# Patient Record
Sex: Female | Born: 1960 | Race: White | Hispanic: No | Marital: Married | Smoking: Never smoker
Health system: Southern US, Community
[De-identification: ages and names within clinical notes are randomized; demographics above are authoritative.]

## PROBLEM LIST (undated history)

## (undated) DIAGNOSIS — T7840XA Allergy, unspecified, initial encounter: Secondary | ICD-10-CM

## (undated) DIAGNOSIS — K219 Gastro-esophageal reflux disease without esophagitis: Secondary | ICD-10-CM

## (undated) DIAGNOSIS — I10 Essential (primary) hypertension: Secondary | ICD-10-CM

## (undated) DIAGNOSIS — E039 Hypothyroidism, unspecified: Secondary | ICD-10-CM

## (undated) DIAGNOSIS — R011 Cardiac murmur, unspecified: Secondary | ICD-10-CM

## (undated) DIAGNOSIS — E785 Hyperlipidemia, unspecified: Secondary | ICD-10-CM

## (undated) DIAGNOSIS — R1013 Epigastric pain: Secondary | ICD-10-CM

## (undated) HISTORY — PX: ABLATION: SHX5711

## (undated) HISTORY — DX: Epigastric pain: R10.13

## (undated) HISTORY — DX: Allergy, unspecified, initial encounter: T78.40XA

## (undated) HISTORY — DX: Essential (primary) hypertension: I10

## (undated) HISTORY — DX: Gastro-esophageal reflux disease without esophagitis: K21.9

## (undated) HISTORY — DX: Hyperlipidemia, unspecified: E78.5

---

## 1997-12-15 ENCOUNTER — Other Ambulatory Visit: Admission: RE | Admit: 1997-12-15 | Discharge: 1997-12-15 | Payer: Self-pay | Admitting: Obstetrics & Gynecology

## 1999-02-17 ENCOUNTER — Other Ambulatory Visit: Admission: RE | Admit: 1999-02-17 | Discharge: 1999-02-17 | Payer: Self-pay | Admitting: Obstetrics and Gynecology

## 2001-06-06 ENCOUNTER — Other Ambulatory Visit: Admission: RE | Admit: 2001-06-06 | Discharge: 2001-06-06 | Payer: Self-pay | Admitting: Obstetrics and Gynecology

## 2001-07-05 ENCOUNTER — Encounter: Payer: Self-pay | Admitting: Obstetrics and Gynecology

## 2001-07-05 ENCOUNTER — Encounter: Admission: RE | Admit: 2001-07-05 | Discharge: 2001-07-05 | Payer: Self-pay | Admitting: Obstetrics and Gynecology

## 2002-06-11 ENCOUNTER — Other Ambulatory Visit: Admission: RE | Admit: 2002-06-11 | Discharge: 2002-06-11 | Payer: Self-pay | Admitting: Obstetrics and Gynecology

## 2002-10-31 ENCOUNTER — Encounter: Admission: RE | Admit: 2002-10-31 | Discharge: 2002-10-31 | Payer: Self-pay | Admitting: Obstetrics and Gynecology

## 2002-10-31 ENCOUNTER — Encounter: Payer: Self-pay | Admitting: Obstetrics and Gynecology

## 2003-06-15 ENCOUNTER — Other Ambulatory Visit: Admission: RE | Admit: 2003-06-15 | Discharge: 2003-06-15 | Payer: Self-pay | Admitting: Obstetrics and Gynecology

## 2003-11-23 ENCOUNTER — Encounter: Admission: RE | Admit: 2003-11-23 | Discharge: 2003-11-23 | Payer: Self-pay | Admitting: Obstetrics and Gynecology

## 2004-06-16 ENCOUNTER — Other Ambulatory Visit: Admission: RE | Admit: 2004-06-16 | Discharge: 2004-06-16 | Payer: Self-pay | Admitting: Obstetrics and Gynecology

## 2005-01-04 ENCOUNTER — Encounter: Admission: RE | Admit: 2005-01-04 | Discharge: 2005-01-04 | Payer: Self-pay | Admitting: Obstetrics and Gynecology

## 2005-06-19 ENCOUNTER — Other Ambulatory Visit: Admission: RE | Admit: 2005-06-19 | Discharge: 2005-06-19 | Payer: Self-pay | Admitting: Obstetrics and Gynecology

## 2006-01-05 ENCOUNTER — Encounter: Admission: RE | Admit: 2006-01-05 | Discharge: 2006-01-05 | Payer: Self-pay | Admitting: Obstetrics and Gynecology

## 2007-01-09 ENCOUNTER — Encounter: Admission: RE | Admit: 2007-01-09 | Discharge: 2007-01-09 | Payer: Self-pay | Admitting: Obstetrics and Gynecology

## 2007-04-04 ENCOUNTER — Ambulatory Visit: Payer: Self-pay | Admitting: Internal Medicine

## 2007-04-04 DIAGNOSIS — K3189 Other diseases of stomach and duodenum: Secondary | ICD-10-CM | POA: Insufficient documentation

## 2007-04-04 DIAGNOSIS — R1013 Epigastric pain: Secondary | ICD-10-CM

## 2007-04-09 ENCOUNTER — Encounter: Payer: Self-pay | Admitting: Internal Medicine

## 2007-04-13 ENCOUNTER — Encounter (INDEPENDENT_AMBULATORY_CARE_PROVIDER_SITE_OTHER): Payer: Self-pay | Admitting: *Deleted

## 2007-04-29 ENCOUNTER — Ambulatory Visit: Payer: Self-pay | Admitting: Internal Medicine

## 2007-04-29 ENCOUNTER — Encounter (INDEPENDENT_AMBULATORY_CARE_PROVIDER_SITE_OTHER): Payer: Self-pay | Admitting: *Deleted

## 2007-05-02 HISTORY — PX: COLONOSCOPY: SHX174

## 2007-05-08 ENCOUNTER — Ambulatory Visit: Payer: Self-pay | Admitting: Internal Medicine

## 2007-05-10 ENCOUNTER — Ambulatory Visit: Payer: Self-pay | Admitting: Gastroenterology

## 2007-06-05 ENCOUNTER — Ambulatory Visit: Payer: Self-pay | Admitting: Gastroenterology

## 2007-06-05 ENCOUNTER — Encounter: Payer: Self-pay | Admitting: Internal Medicine

## 2007-06-05 LAB — HM COLONOSCOPY

## 2007-07-25 ENCOUNTER — Ambulatory Visit: Payer: Self-pay | Admitting: Internal Medicine

## 2007-07-29 LAB — CONVERTED CEMR LAB
ALT: 19 units/L (ref 0–35)
Bilirubin, Direct: 0.1 mg/dL (ref 0.0–0.3)
Cholesterol: 196 mg/dL (ref 0–200)
HDL: 46.1 mg/dL (ref >39.0)
Total Protein: 6.8 g/dL (ref 6.0–8.3)
VLDL: 24 mg/dL (ref 0–40)

## 2007-07-30 ENCOUNTER — Encounter (INDEPENDENT_AMBULATORY_CARE_PROVIDER_SITE_OTHER): Payer: Self-pay | Admitting: *Deleted

## 2007-08-01 ENCOUNTER — Ambulatory Visit: Payer: Self-pay | Admitting: Internal Medicine

## 2007-08-01 DIAGNOSIS — E785 Hyperlipidemia, unspecified: Secondary | ICD-10-CM

## 2007-08-01 DIAGNOSIS — E7800 Pure hypercholesterolemia, unspecified: Secondary | ICD-10-CM | POA: Insufficient documentation

## 2007-10-30 ENCOUNTER — Encounter: Payer: Self-pay | Admitting: Internal Medicine

## 2007-11-05 ENCOUNTER — Ambulatory Visit: Payer: Self-pay | Admitting: Internal Medicine

## 2007-11-16 LAB — CONVERTED CEMR LAB
AST: 16 units/L (ref 0–37)
Albumin: 3.4 g/dL — ABNORMAL LOW (ref 3.5–5.2)
Alkaline Phosphatase: 43 units/L (ref 39–117)
Total Bilirubin: 0.6 mg/dL (ref 0.3–1.2)
Total CHOL/HDL Ratio: 3.3
Triglycerides: 111 mg/dL (ref 0–149)

## 2007-11-19 ENCOUNTER — Encounter (INDEPENDENT_AMBULATORY_CARE_PROVIDER_SITE_OTHER): Payer: Self-pay | Admitting: *Deleted

## 2007-11-22 ENCOUNTER — Telehealth (INDEPENDENT_AMBULATORY_CARE_PROVIDER_SITE_OTHER): Payer: Self-pay | Admitting: *Deleted

## 2008-01-27 ENCOUNTER — Ambulatory Visit (HOSPITAL_BASED_OUTPATIENT_CLINIC_OR_DEPARTMENT_OTHER): Admission: RE | Admit: 2008-01-27 | Discharge: 2008-01-27 | Payer: Self-pay | Admitting: Obstetrics and Gynecology

## 2008-05-07 ENCOUNTER — Ambulatory Visit: Payer: Self-pay | Admitting: Internal Medicine

## 2008-05-16 LAB — CONVERTED CEMR LAB
Bilirubin, Direct: 0.1 mg/dL (ref 0.0–0.3)
LDL Cholesterol: 123 mg/dL — ABNORMAL HIGH (ref 0–99)
Total Bilirubin: 0.7 mg/dL (ref 0.3–1.2)
Total CHOL/HDL Ratio: 4.3
VLDL: 24 mg/dL (ref 0–40)

## 2008-05-18 ENCOUNTER — Telehealth (INDEPENDENT_AMBULATORY_CARE_PROVIDER_SITE_OTHER): Payer: Self-pay | Admitting: *Deleted

## 2008-05-19 ENCOUNTER — Ambulatory Visit: Payer: Self-pay | Admitting: Internal Medicine

## 2008-05-19 LAB — CONVERTED CEMR LAB
HDL goal, serum: 50 mg/dL
LDL Goal: 100 mg/dL

## 2008-08-11 ENCOUNTER — Telehealth (INDEPENDENT_AMBULATORY_CARE_PROVIDER_SITE_OTHER): Payer: Self-pay | Admitting: *Deleted

## 2008-08-21 ENCOUNTER — Ambulatory Visit: Payer: Self-pay | Admitting: Internal Medicine

## 2008-08-23 LAB — CONVERTED CEMR LAB
ALT: 18 units/L (ref 0–35)
AST: 19 units/L (ref 0–37)
Albumin: 3.7 g/dL (ref 3.5–5.2)
Cholesterol: 200 mg/dL (ref 0–200)
HDL: 45.7 mg/dL (ref >39.00)
Total CK: 65 units/L (ref 7–177)
Triglycerides: 150 mg/dL — ABNORMAL HIGH (ref 0.0–149.0)

## 2008-08-24 ENCOUNTER — Encounter (INDEPENDENT_AMBULATORY_CARE_PROVIDER_SITE_OTHER): Payer: Self-pay | Admitting: *Deleted

## 2009-01-27 ENCOUNTER — Ambulatory Visit (HOSPITAL_BASED_OUTPATIENT_CLINIC_OR_DEPARTMENT_OTHER): Admission: RE | Admit: 2009-01-27 | Discharge: 2009-01-27 | Payer: Self-pay | Admitting: Nurse Practitioner

## 2009-01-27 ENCOUNTER — Ambulatory Visit: Payer: Self-pay | Admitting: Diagnostic Radiology

## 2009-06-11 ENCOUNTER — Telehealth (INDEPENDENT_AMBULATORY_CARE_PROVIDER_SITE_OTHER): Payer: Self-pay | Admitting: *Deleted

## 2009-07-19 ENCOUNTER — Ambulatory Visit: Payer: Self-pay | Admitting: Family Medicine

## 2009-07-19 ENCOUNTER — Encounter: Payer: Self-pay | Admitting: Internal Medicine

## 2009-07-19 DIAGNOSIS — I10 Essential (primary) hypertension: Secondary | ICD-10-CM | POA: Insufficient documentation

## 2009-07-19 DIAGNOSIS — R609 Edema, unspecified: Secondary | ICD-10-CM | POA: Insufficient documentation

## 2009-07-26 LAB — CONVERTED CEMR LAB
BUN: 14 mg/dL (ref 6–23)
Bilirubin, Direct: 0.1 mg/dL (ref 0.0–0.3)
Calcium: 9.4 mg/dL (ref 8.4–10.5)
Creatinine, Ser: 0.7 mg/dL (ref 0.4–1.2)
TSH: 3.27 microintl units/mL (ref 0.35–5.50)
Total Bilirubin: 0.4 mg/dL (ref 0.3–1.2)
Total Protein: 6.4 g/dL (ref 6.0–8.3)

## 2009-08-02 ENCOUNTER — Ambulatory Visit: Payer: Self-pay | Admitting: Family Medicine

## 2009-09-06 ENCOUNTER — Telehealth (INDEPENDENT_AMBULATORY_CARE_PROVIDER_SITE_OTHER): Payer: Self-pay | Admitting: *Deleted

## 2009-09-17 ENCOUNTER — Ambulatory Visit: Payer: Self-pay | Admitting: Internal Medicine

## 2009-09-17 LAB — CONVERTED CEMR LAB
Bilirubin Urine: NEGATIVE
Blood in Urine, dipstick: NEGATIVE
Glucose, Urine, Semiquant: NEGATIVE
Protein, U semiquant: NEGATIVE
Specific Gravity, Urine: 1.01
pH: 6.5

## 2009-09-20 LAB — CONVERTED CEMR LAB
ALT: 26 units/L (ref 0–35)
AST: 22 units/L (ref 0–37)
BUN: 17 mg/dL (ref 6–23)
Basophils Relative: 0.8 % (ref 0.0–3.0)
Bilirubin, Direct: 0.1 mg/dL (ref 0.0–0.3)
Calcium: 9.3 mg/dL (ref 8.4–10.5)
Cholesterol: 178 mg/dL (ref 0–200)
Creatinine, Ser: 0.7 mg/dL (ref 0.4–1.2)
Eosinophils Relative: 7.3 % — ABNORMAL HIGH (ref 0.0–5.0)
GFR calc non Af Amer: 94.76 mL/min (ref >60)
Glucose, Bld: 89 mg/dL (ref 70–99)
Lymphs Abs: 1.9 10*3/uL (ref 0.7–4.0)
Monocytes Absolute: 0.4 10*3/uL (ref 0.1–1.0)
Neutro Abs: 2.4 10*3/uL (ref 1.4–7.7)
Potassium: 4.1 meq/L (ref 3.5–5.1)
TSH: 4.15 microintl units/mL (ref 0.35–5.50)
Total Protein: 6.9 g/dL (ref 6.0–8.3)
VLDL: 28.6 mg/dL (ref 0.0–40.0)

## 2009-09-24 ENCOUNTER — Encounter: Payer: Self-pay | Admitting: Internal Medicine

## 2009-09-24 ENCOUNTER — Ambulatory Visit: Payer: Self-pay | Admitting: Internal Medicine

## 2009-09-24 ENCOUNTER — Encounter (INDEPENDENT_AMBULATORY_CARE_PROVIDER_SITE_OTHER): Payer: Self-pay | Admitting: *Deleted

## 2009-09-24 LAB — CONVERTED CEMR LAB: OCCULT 2: NEGATIVE

## 2010-01-28 ENCOUNTER — Ambulatory Visit: Payer: Self-pay | Admitting: Diagnostic Radiology

## 2010-01-28 ENCOUNTER — Ambulatory Visit (HOSPITAL_BASED_OUTPATIENT_CLINIC_OR_DEPARTMENT_OTHER): Admission: RE | Admit: 2010-01-28 | Discharge: 2010-01-28 | Payer: Self-pay | Admitting: Obstetrics and Gynecology

## 2010-05-29 LAB — CONVERTED CEMR LAB
Albumin: 3.5 g/dL (ref 3.5–5.2)
Basophils Absolute: 0 10*3/uL (ref 0.0–0.1)
Eosinophils Absolute: 0.2 10*3/uL (ref 0.0–0.6)
GFR calc Af Amer: 139 mL/min
GFR calc non Af Amer: 115 mL/min
HCT: 38.8 % (ref 36.0–46.0)
Hemoglobin: 13.4 g/dL (ref 12.0–15.0)
MCHC: 34.6 g/dL (ref 30.0–36.0)
MCV: 96 fL (ref 78.0–100.0)
Monocytes Absolute: 0.4 10*3/uL (ref 0.2–0.7)
Neutro Abs: 3.7 10*3/uL (ref 1.4–7.7)
Neutrophils Relative %: 52.6 % (ref 43.0–77.0)
Potassium: 3.8 meq/L (ref 3.5–5.1)
Sodium: 138 meq/L (ref 135–145)
TSH: 3.29 microintl units/mL (ref 0.35–5.50)
Total Bilirubin: 0.7 mg/dL (ref 0.3–1.2)

## 2010-05-31 NOTE — Progress Notes (Signed)
Summary: Refill Request  Phone Note Refill Request Message from:  Patient  Refills Requested: Medication #1:  SIMVASTATIN 40 MG TABS 1 at bedtime. [facility]   Method Requested: Fax to [facility] Initial call taken by: Shonna Chock,  June 11, 2009 9:44 AM  Follow-up for Phone Call        Patient with pending appointment 07/25/2009 Follow-up by: Shonna Chock,  June 11, 2009 10:03 AM    Prescriptions: SIMVASTATIN 40 MG TABS (SIMVASTATIN) 1 at bedtime  #90 x 0   Entered by:   Shonna Chock   Authorized by:   Marga Melnick MD   Signed by:   Shonna Chock on 06/11/2009   Method used:   Faxed to ...       [facility]* (mail-order)             ,          Ph: [phone]       Fax: [phone]   RxID:   [id number] SIMVASTATIN 40 MG TABS (SIMVASTATIN) 1 at bedtime  #90 x 0   Entered by:   Shonna Chock   Authorized by:   Marga Melnick MD   Signed by:   Shonna Chock on 06/11/2009   Method used:   Electronically to        [facility]* (mail-order)             ,          Ph: [phone]       Fax: [phone]   RxID:   [id number]

## 2010-05-31 NOTE — Letter (Signed)
Summary: Cancer Screening/[facility] Personalized Risk Profile  Cancer Screening/[facility] Personalized Risk Profile   Imported By: Lanelle Bal 10/04/2009 14:07:22  _____________________________________________________________________  External Attachment:    Type:   Image     Comment:   External Document

## 2010-05-31 NOTE — Assessment & Plan Note (Signed)
Summary: BLD PRESSURE RUNNING HIGH/RH.......Emily Anderson   Vital Signs:  Patient profile:   50 year old female Height:      68 inches Weight:      182 pounds BMI:     27.77 Pulse rate:   85 / minute Pulse rhythm:   regular BP sitting:   130 / 88  (left arm) Cuff size:   large  Vitals Entered By: Army Fossa CMA (July 19, 2009 1:54 PM) CC: Pts gyn told her BP was high- it was 130/92, at the dentist this am it was 141/91.   History of Present Illness: Pt here for bp check.  Pt saw gyn last Thursday and Dentist this am and bp ran high in both places.  Pt has felt dizzy and chest pressure.   She also states that her heart races at times.  No chest pain or sob.  Current Medications (verified): 1)  Multivitamin 2)  Calcium 3)  Fish Oil 4)  Tums 5)  Basa 81mg  6)  Simvastatin 40 Mg Tabs (Simvastatin) .Emily Anderson.. 1 At Bedtime 7)  Hydrochlorothiazide 25 Mg Tabs (Hydrochlorothiazide) .Emily Anderson.. 1 By Mouth Qam  Allergies (verified): No Known Drug Allergies  Past History:  Past medical, surgical, family and social histories (including risk factors) reviewed for relevance to current acute and chronic problems.  Past Medical History: healthy Hyperlipidemia Current Problems:  HYPERLIPIDEMIA (ICD-272.2) FAMILY HISTORY COLONIC POLYPS (ICD-V18.51) DYSPEPSIA (ICD-536.8) URI (ICD-465.9) HYPERLIPIDEMIA (ICD-272.2) HYPERLIPIDEMIA (ICD-272.4) ROUTINE GENERAL MEDICAL EXAM@HEALTH  CARE FACL (ICD-V70.0) Hypertension  Past Surgical History: Reviewed history from 08/01/2007 and no changes required. Caesarean section two babies; G2,P2 Colonoscopy 2009 neg  Family History: Reviewed history from 05/19/2008 and no changes required. Father:colon polyps, prostate cancer, angioplasty @ 75 Mother:thyroid cancer, died after valve replacement surgery  Siblings: sister colon polyps  Social History: Reviewed history from 04/04/2007 and no changes required. No diet  Review of Systems      See HPI  Physical  Exam  General:  Well-developed,well-nourished,in no acute distress; alert,appropriate and cooperative throughout examination Neck:  No deformities, masses, or tenderness noted. Lungs:  Normal respiratory effort, chest expands symmetrically. Lungs are clear to auscultation, no crackles or wheezes. Heart:  normal rate and no murmur.   Psych:  Oriented X3, normally interactive, good eye contact, and slightly anxious.     Impression & Recommendations:  Problem # 1:  HYPERTENSION (ICD-401.9) K rich foods HO given to pt Her updated medication list for this problem includes:    Hydrochlorothiazide 25 Mg Tabs (Hydrochlorothiazide) .Emily Anderson... 1 by mouth qam  BP today: 130/88 Prior BP: 124/82 (05/19/2008)  Prior 10 Yr Risk Heart Disease: 4 % (05/19/2008)  Labs Reviewed: K+: 3.8 (04/04/2007) Creat: : 0.6 (04/04/2007)   Chol: 200 (08/21/2008)   HDL: 45.70 (08/21/2008)   LDL: 124 (08/21/2008)   TG: 150.0 (08/21/2008)  Orders: Venipuncture ([address]) TLB-BMP (Basic Metabolic Panel-BMET) (80048-METABOL) TLB-Hepatic/Liver Function Pnl (80076-HEPATIC) TLB-TSH (Thyroid Stimulating Hormone) (84443-TSH) EKG w/ Interpretation (93000)  Complete Medication List: 1)  Multivitamin  2)  Calcium  3)  Fish Oil  4)  Tums  5)  Basa 81mg   6)  Simvastatin 40 Mg Tabs (Simvastatin) .Emily Anderson.. 1 at bedtime 7)  Hydrochlorothiazide 25 Mg Tabs (Hydrochlorothiazide) .Emily Anderson.. 1 by mouth qam  Patient Instructions: 1)  Please schedule a follow-up appointment in 2 weeks.  Prescriptions: HYDROCHLOROTHIAZIDE 25 MG TABS (HYDROCHLOROTHIAZIDE) 1 by mouth qam  #30 x 1   Entered and Authorized by:   Loreen Freud DO   Signed by:   Myrene Buddy  Lowne DO on 07/19/2009   Method used:   Electronically to        [id number]* (retail)       [address]       [place], [place]  [address]       Ph: [phone]       Fax: [phone]   RxID:   [id number]    EKG  Procedure date:   07/19/2009  Findings:      Normal sinus rhythm with rate of:  67 bpm

## 2010-05-31 NOTE — Assessment & Plan Note (Signed)
Summary: 2WK RETURN OV/RH......   Vital Signs:  Patient profile:   50 year old female Weight:      180 pounds Pulse rate:   86 / minute Pulse rhythm:   regular BP sitting:   124 / 80  (left arm) Cuff size:   regular  Vitals Entered By: Army Fossa CMA (August 02, 2009 3:08 PM) CC: Pt here to follow up on BP   History of Present Illness: Pt here for bp check ---no complaints.    Allergies (verified): No Known Drug Allergies    Physical Exam  General:  Well-developed,well-nourished,in no acute distress; alert,appropriate and cooperative throughout examination Lungs:  Normal respiratory effort, chest expands symmetrically. Lungs are clear to auscultation, no crackles or wheezes. Heart:  normal rate and no murmur.   Extremities:  No clubbing, cyanosis, edema, or deformity noted with normal full range of motion of all joints.   Skin:  Intact without suspicious lesions or rashes Psych:  Cognition and judgment appear intact. Alert and cooperative with normal attention span and concentration. No apparent delusions, illusions, hallucinations   Impression & Recommendations:  Problem # 1:  HYPERTENSION (ICD-401.9)  Her updated medication list for this problem includes:    Hydrochlorothiazide 25 Mg Tabs (Hydrochlorothiazide) .Marland Kitchen... 1 by mouth qam  BP today: 124/80 Prior BP: 130/88 (07/19/2009)  Prior 10 Yr Risk Heart Disease: 4 % (05/19/2008)  Labs Reviewed: K+: 4.1 (07/19/2009) Creat: : 0.7 (07/19/2009)   Chol: 200 (08/21/2008)   HDL: 45.70 (08/21/2008)   LDL: 124 (08/21/2008)   TG: 150.0 (08/21/2008)  Complete Medication List: 1)  Multivitamin  2)  Calcium  3)  Fish Oil  4)  Tums  5)  Basa 81mg   6)  Simvastatin 40 Mg Tabs (Simvastatin) .Marland Kitchen.. 1 at bedtime 7)  Hydrochlorothiazide 25 Mg Tabs (Hydrochlorothiazide) .Marland Kitchen.. 1 by mouth qam  Patient Instructions: 1)  rto 3 months Prescriptions: HYDROCHLOROTHIAZIDE 25 MG TABS (HYDROCHLOROTHIAZIDE) 1 by mouth qam  #90 x 3  Entered and Authorized by:   Loreen Freud DO   Signed by:   Loreen Freud DO on 08/02/2009   Method used:   Electronically to        [facility] [address] (retail)       [address]       [place], [place]  [address]       Ph: [phone]       Fax: [phone]   RxID:   [id number]

## 2010-05-31 NOTE — Progress Notes (Signed)
Summary: lab appt [phone] - cpx [phone]  Phone Note Refill Request   Refills Requested: Medication #1:  SIMVASTATIN 40 MG TABS 1 at bedtime patient would like 30 day supply called in to [facility] --- she has appt [id number] cpx  -----lab appt [phone] need lab order   Method Requested: Electronic Next Appointment Scheduled: [id number] Initial call taken by: Okey Regal Spring,  Sep 06, 2009 8:37 AM  Follow-up for Phone Call        Lipid,Hep,BMP,TSH,CBCD,Udip, Stool Cards V70.0/995.20/272.4/401.9  lab order added to appt .Marland KitchenOkey Regal Spring  Sep 07, 2009 9:42 AM  Follow-up by: Shonna Chock,  Sep 06, 2009 2:12 PM    Prescriptions: SIMVASTATIN 40 MG TABS (SIMVASTATIN) 1 at bedtime  #30 x 0   Entered by:   Shonna Chock   Authorized by:   Marga Melnick MD   Signed by:   Shonna Chock on 09/06/2009   Method used:   Electronically to        [id number]* (retail)       [address]       [place], [place]  [address]       Ph: [phone]       Fax: [phone]   RxID:   [id number]

## 2010-05-31 NOTE — Letter (Signed)
Summary: Results Follow up Letter  [facility]  [address] [place], [place] [address]   Phone: [phone]  Fax: [phone]    09/24/2009 MRN: [id number]  [facility] [address] [place], [place]  [address]  Dear Ms. Scallan,  The following are the results of your recent test(s):  Test         Result    Pap Smear:        Normal _____  Not Normal _____ Comments: ______________________________________________________ Cholesterol: LDL(Bad cholesterol):         Your goal is less than:         HDL (Good cholesterol):       Your goal is more than: Comments:  ______________________________________________________ Mammogram:        Normal _____  Not Normal _____ Comments:  ___________________________________________________________________ Hemoccult:        Normal __x___  Not normal _______ Comments:    _____________________________________________________________________ Other Tests:    We routinely do not discuss normal results over the telephone.  If you desire a copy of the results, or you have any questions about this information we can discuss them at your next office visit.   Sincerely,

## 2010-05-31 NOTE — Assessment & Plan Note (Signed)
Summary: cpx/kdc   Vital Signs:  Patient profile:   50 year old female Height:      68 inches Weight:      180 pounds BMI:     27.47 Temp:     98.6 degrees F oral Pulse rate:   64 / minute Resp:     15 per minute BP sitting:   147 / 85  (left arm) Cuff size:   regular  Vitals Entered By: Shonna Chock (Sep 24, 2009 9:37 AM)  Comments REVIEWED MED LIST, PATIENT AGREED DOSE AND INSTRUCTION CORRECT   **RECHECKED B/P: 130/84**   Primary Care Provider:  Alwyn Ren   History of Present Illness: Emily Anderson is here for a physical; she has had some palpitations on the combination of  HCTZ & Zyrtec.These resolved off Zyrtec (NOT Zyrtec D). BP ranges 117/69-141/91. Recheck of BP was 130/84. EKG 07/19/2009 reviewed.  Lipid Management History:      Positive NCEP/ATP III risk factors include hypertension.  Negative NCEP/ATP III risk factors include female age less than 48 years old, no history of early menopause without estrogen hormone replacement, non-diabetic, no family history for ischemic heart disease, non-tobacco-user status, no ASHD (atherosclerotic heart disease), no prior stroke/TIA, no peripheral vascular disease, and no history of aortic aneurysm.     Preventive Screening-Counseling & Management  Alcohol-Tobacco     Smoking Status: never  Caffeine-Diet-Exercise     Does Patient Exercise: yes  Allergies: No Known Drug Allergies  Past History:  Past Medical History: Hyperlipidemia: NMR Lipoprofile 2008: LDL 174(2631/2163), TG 167, HDL 49. LDL goal = < 100.[place] LDL goal = < 160. DYSPEPSIA (ICD-536.8) Hypertension  Past Surgical History: Caesarean section X2; G2,P2 Colonoscopy 2009 negative  Family History: Father:colon polyps, prostate cancer, angioplasty @ 75; Mother:thyroid cancer, died after valve replacement surgery due to SBO ; Siblings: sister:colon polyps  Social History: Modified low carb, low salt Occupation:Facilities Manager Never Smoked Alcohol  use-no Regular exercise-yes: CVE as treadmill , recombent bike 3X /week X 50 minutes  Review of Systems       The patient complains of weight gain.  The patient denies anorexia, fever, vision loss, decreased hearing, hoarseness, chest pain, syncope, dyspnea on exertion, peripheral edema, prolonged cough, headaches, hemoptysis, abdominal pain, melena, hematochezia, severe indigestion/heartburn, hematuria, incontinence, suspicious skin lesions, depression, unusual weight change, abnormal bleeding, enlarged lymph nodes, and angioedema.         Weight up 20# over past 2 years despite low carb program. TUMS as needed for dyspepsia  Physical Exam  General:  well-nourished, alert,appropriate and cooperative throughout examination Head:  Normocephalic and atraumatic without obvious abnormalities. Eyes:  No corneal or conjunctival inflammation noted.Perrla. Funduscopic exam benign, without hemorrhages, exudates or papilledema.  Ears:  External ear exam shows no significant lesions or deformities.  Otoscopic examination reveals clear canals, tympanic membranes are intact bilaterally without bulging, retraction, inflammation or discharge. Hearing is grossly normal bilaterally. Nose:  External nasal examination shows no deformity or inflammation. Nasal mucosa are pink and moist without lesions or exudates. Mouth:  Oral mucosa and oropharynx without lesions or exudates.  Teeth in good repair. Neck:  No deformities, masses, or tenderness noted.No nodules Lungs:  Normal respiratory effort, chest expands symmetrically. Lungs are clear to auscultation, no crackles or wheezes. Heart:  normal rate, regular rhythm, no gallop, no rub, no JVD, no HJR, and grade 1/2  /6 systolic murmur @ LSB.   Abdomen:  Bowel sounds positive,abdomen soft and non-tender without masses, organomegaly or hernias noted.  Genitalia:  Harvette  Corder NP, Gyn Msk:  No deformity or scoliosis noted of thoracic or lumbar spine.  Slight  asymmetry of thoracic musculature Pulses:  R and L carotid,radial,dorsalis pedis and posterior tibial pulses are full and equal bilaterally Extremities:  No clubbing, cyanosis, edema, or deformity noted with normal full range of motion of all joints.   Neurologic:  alert & oriented X3 and DTRs symmetrical and normal.   Skin:  Intact without suspicious lesions or rashes Cervical Nodes:  No lymphadenopathy noted Axillary Nodes:  No palpable lymphadenopathy Psych:  memory intact for recent and remote, normally interactive, and good eye contact.     Impression & Recommendations:  Problem # 1:  ROUTINE GENERAL MEDICAL EXAM@HEALTH  CARE FACL (ICD-V70.0)  Problem # 2:  HYPERTENSION (ICD-401.9) controlled Her updated medication list for this problem includes:    Hydrochlorothiazide 25 Mg Tabs (Hydrochlorothiazide) .Marland Kitchen... 1 by mouth qam  Problem # 3:  HYPERLIPIDEMIA (ICD-272.4) LDL @ goal Her updated medication list for this problem includes:    Simvastatin 40 Mg Tabs (Simvastatin) .Marland Kitchen... 1 by mouth once daily  Problem # 4:  EDEMA (ICD-782.3) Resolved Her updated medication list for this problem includes:    Hydrochlorothiazide 25 Mg Tabs (Hydrochlorothiazide) .Marland Kitchen... 1 by mouth qam  Problem # 5:  DYSPEPSIA (ICD-536.8) Intermittent  Complete Medication List: 1)  Multivitamin  2)  Calcium  3)  Fish Oil  4)  Tums  5)  Basa 81mg   6)  Simvastatin 40 Mg Tabs (Simvastatin) .Marland Kitchen.. 1 by mouth once daily 7)  Hydrochlorothiazide 25 Mg Tabs (Hydrochlorothiazide) .Marland Kitchen.. 1 by mouth qam  Lipid Assessment/Plan:      Based on NCEP/ATP III, the patient's risk factor category is "0-1 risk factors".  The patient's lipid goals are as follows: Total cholesterol goal is 200; LDL cholesterol goal is 100; HDL cholesterol goal is 50; Triglyceride goal is 150.  Her LDL cholesterol goal has been met.  Secondary causes for hyperlipidemia have been ruled out.  She has been counseled on adjunctive measures for lowering  her cholesterol and has been provided with dietary instructions.    Patient Instructions: 1)  Check your Blood Pressure regularly. If it is above: 135/85 ON AVERAGE  you should make an appointment. Consume LESS THAN 25 grams of High Fructose Corn Syrup "sugar" / day as discussed. 2)  Limit your Sodium (Salt) to less than 4 grams a day (slightly less than 1 teaspoon) to prevent fluid retention, swelling, or worsening or symptoms.Avoid foods high in acid (tomatoes, citrus juices, spicy foods). Avoid eating within two hours of lying down or before exercising. Do not over eat; try smaller more frequent meals. Elevate head of bed twelve inches when sleeping. Prescriptions: SIMVASTATIN 40 MG TABS (SIMVASTATIN) 1 by mouth once daily  #90 x 3   Entered and Authorized by:   Marga Melnick MD   Signed by:   Marga Melnick MD on 09/24/2009   Method used:   Print then Give to Patient   RxID:   [id number]

## 2010-09-01 ENCOUNTER — Other Ambulatory Visit: Payer: Self-pay | Admitting: Internal Medicine

## 2010-09-01 NOTE — Telephone Encounter (Signed)
Patient needs to schedule a CPX and fasting labs: Lipid, Hep,BMP,CBCD,TSH,Stool Cards v70.0/272.4/401.9/995.20

## 2010-09-04 ENCOUNTER — Other Ambulatory Visit: Payer: Self-pay | Admitting: Family Medicine

## 2010-09-05 NOTE — Telephone Encounter (Signed)
Hop patient please advise     KP

## 2010-09-13 NOTE — Assessment & Plan Note (Signed)
 HEALTHCARE                         GASTROENTEROLOGY OFFICE NOTE   NAME:FORRESTRainy, Rothman                     MRN:          [id number]  DATE:05/10/2007                            DOB:          05-Dec-1960    REFERRING PHYSICIAN:  Titus Dubin. Alwyn Ren, MD,FACP,FCCP   REASON FOR REFERRAL:  Dr. Alwyn Ren asked me to evaluate Ms. Swift in  consultation regarding family history of colon polyps.   HISTORY OF PRESENT ILLNESS:  Ms. Lefevers is a pleasant, 50 year old  woman who has never had constipation, diarrhea, rectal bleeding, but  whose sister and father both have had colon polyps.  She does not  believe that anybody in her family has ever had colon cancer.  She has  never had colonoscopy.  She had recent lab tests done by primary care  showing a normal CBC, normal complete metabolic profile.  She was sent  here to discuss colorectal cancer screening.   REVIEW OF SYSTEMS:  Notable for stable weight, otherwise is essentially  normal and is available on the nursing intake sheet.   PAST MEDICAL HISTORY:  Elevated cholesterol, sinus trouble, 2 C-  sections.   CURRENT MEDICINES:  Pravastatin, Trivora, multivitamins, aspirin 81 mg  once daily, calcium, omega, Tums, Zantac.   ALLERGIES:  NO KNOWN DRUG ALLERGIES.   SOCIAL HISTORY:  Married, 2 children.  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  Nonsmoker, nondrinker.   FAMILY HISTORY:  Father with prostate cancer, father with colon polyps,  sister with colon polyps.  No colon cancer in the family.   PHYSICAL EXAMINATION:  5 feet 8 inches, 177 pounds, blood pressure  140/90, pulse 78.  CONSTITUTIONAL:  Generally well appearing.  NEUROLOGIC:  Alert and oriented x3.  Eyes, extraocular movements intact.  Mouth, oropharynx moist, no lesions.  NECK:  Supple, no lymphadenopathy.  CARDIOVASCULAR:  Regular rate and rhythm.  LUNGS:  Clear to auscultation bilaterally.  ABDOMEN:  Soft, nontender, nondistended.   Normal bowel sounds.  EXTREMITIES:  No lower extremity edema.  SKIN:  No rashes or lesions on visible extremities.   ASSESSMENT AND PLAN:  A 50 year old woman at elevated risk for  colorectal neoplasia, given her family history of colon polyps.   I discussed the risks and benefits to colonoscopy including perforation,  bleeding,  missing a cancer, sedation reactions.  She understands the  risks and wishes to proceed.  We will therefore arrange for a  colonoscopy to be performed at her soonest convenience.  I see no reason  for any further blood tests or imaging studies prior to them.     Rachael Fee, MD  Electronically Signed    DPJ/[facility]  DD: 05/10/2007  DT: 05/10/2007  Job #: [id number]   cc:   Titus Dubin. Alwyn Ren, MD,FACP,FCCP

## 2010-11-01 ENCOUNTER — Other Ambulatory Visit: Payer: Self-pay | Admitting: Internal Medicine

## 2010-11-22 ENCOUNTER — Other Ambulatory Visit: Payer: Self-pay | Admitting: Internal Medicine

## 2010-11-22 ENCOUNTER — Encounter: Payer: Self-pay | Admitting: *Deleted

## 2010-12-26 ENCOUNTER — Other Ambulatory Visit: Payer: Self-pay | Admitting: Internal Medicine

## 2010-12-27 ENCOUNTER — Other Ambulatory Visit (HOSPITAL_BASED_OUTPATIENT_CLINIC_OR_DEPARTMENT_OTHER): Payer: Self-pay | Admitting: Nurse Practitioner

## 2010-12-27 DIAGNOSIS — Z1231 Encounter for screening mammogram for malignant neoplasm of breast: Secondary | ICD-10-CM

## 2011-02-06 ENCOUNTER — Other Ambulatory Visit (INDEPENDENT_AMBULATORY_CARE_PROVIDER_SITE_OTHER): Payer: 59

## 2011-02-06 DIAGNOSIS — I10 Essential (primary) hypertension: Secondary | ICD-10-CM

## 2011-02-06 DIAGNOSIS — E785 Hyperlipidemia, unspecified: Secondary | ICD-10-CM

## 2011-02-06 DIAGNOSIS — Z0000 Encounter for general adult medical examination without abnormal findings: Secondary | ICD-10-CM

## 2011-02-06 LAB — LIPID PANEL
HDL: 55 mg/dL (ref >39.00)
LDL Cholesterol: 85 mg/dL (ref 0–99)
Total CHOL/HDL Ratio: 3
Triglycerides: 169 mg/dL — ABNORMAL HIGH (ref 0.0–149.0)

## 2011-02-06 LAB — CBC WITH DIFFERENTIAL/PLATELET
Basophils Relative: 0.5 % (ref 0.0–3.0)
Eosinophils Relative: 3.8 % (ref 0.0–5.0)
Lymphocytes Relative: 36.1 % (ref 12.0–46.0)
MCV: 97.2 fl (ref 78.0–100.0)
Monocytes Absolute: 0.4 10*3/uL (ref 0.1–1.0)
Monocytes Relative: 6.4 % (ref 3.0–12.0)
Neutrophils Relative %: 53.2 % (ref 43.0–77.0)
RBC: 4.09 Mil/uL (ref 3.87–5.11)
WBC: 6.3 10*3/uL (ref 4.5–10.5)

## 2011-02-06 LAB — BASIC METABOLIC PANEL
CO2: 25 mEq/L (ref 19–32)
Calcium: 8.6 mg/dL (ref 8.4–10.5)
Sodium: 138 mEq/L (ref 135–145)

## 2011-02-06 LAB — HEPATIC FUNCTION PANEL
AST: 21 U/L (ref 0–37)
Albumin: 3.8 g/dL (ref 3.5–5.2)

## 2011-02-06 NOTE — Progress Notes (Signed)
Labs only

## 2011-02-06 NOTE — Progress Notes (Signed)
Addended by: Legrand Como on: 02/06/2011 02:35 PM   Modules accepted: Orders

## 2011-02-14 ENCOUNTER — Ambulatory Visit (HOSPITAL_BASED_OUTPATIENT_CLINIC_OR_DEPARTMENT_OTHER)
Admission: RE | Admit: 2011-02-14 | Discharge: 2011-02-14 | Disposition: A | Discharge status: Home / Self Care | Payer: 59 | Source: Ambulatory Visit | Attending: Nurse Practitioner | Admitting: Nurse Practitioner

## 2011-02-14 DIAGNOSIS — Z1231 Encounter for screening mammogram for malignant neoplasm of breast: Secondary | ICD-10-CM

## 2011-02-20 ENCOUNTER — Other Ambulatory Visit: Payer: Self-pay | Admitting: Internal Medicine

## 2011-02-21 ENCOUNTER — Encounter: Payer: Self-pay | Admitting: Internal Medicine

## 2011-02-23 ENCOUNTER — Ambulatory Visit (INDEPENDENT_AMBULATORY_CARE_PROVIDER_SITE_OTHER): Payer: 59 | Admitting: Internal Medicine

## 2011-02-23 ENCOUNTER — Encounter: Payer: Self-pay | Admitting: Internal Medicine

## 2011-02-23 VITALS — BP 114/78 | HR 96 | Temp 99.0°F | Resp 12 | Ht 67.5 in | Wt 179.0 lb

## 2011-02-23 DIAGNOSIS — J209 Acute bronchitis, unspecified: Secondary | ICD-10-CM

## 2011-02-23 DIAGNOSIS — I10 Essential (primary) hypertension: Secondary | ICD-10-CM

## 2011-02-23 DIAGNOSIS — J069 Acute upper respiratory infection, unspecified: Secondary | ICD-10-CM

## 2011-02-23 DIAGNOSIS — E785 Hyperlipidemia, unspecified: Secondary | ICD-10-CM

## 2011-02-23 DIAGNOSIS — Z0000 Encounter for general adult medical examination without abnormal findings: Secondary | ICD-10-CM

## 2011-02-23 MED ORDER — HYDROCODONE-HOMATROPINE 5-1.5 MG/5ML PO SYRP: 5.0000 mL | ORAL_SOLUTION | Freq: Four times a day (QID) | ORAL | Status: AC | PRN

## 2011-02-23 MED ORDER — AMOXICILLIN 500 MG PO CAPS: 500.0000 mg | ORAL_CAPSULE | Freq: Three times a day (TID) | ORAL | Status: DC

## 2011-02-23 NOTE — Patient Instructions (Addendum)
Eat a low-fat diet with lots of fruits and vegetables, up to 7-9 servings per day. Avoid obesity; your goal is waist measurement < 40 inches.Consume less than 40 grams of sugar per day from foods & drinks with High Fructose Corn Sugar as #1,2,3 or # 4 on label. Follow the low carb nutrition program in [employer] as closely as possible to prevent Diabetes progression & complications. White carbohydrates (potatoes, rice, bread, and pasta) have a high spike of sugar and a high load of sugar. For example a  baked potato has a cup of sugar and a  french fry  2 teaspoons of sugar. Yams, wild  rice, whole grained bread &  wheat pasta have been much lower spike and load of  sugar. Portions should be the size of a deck of cards or your palm.  Dip gauze in  sterile saline and applied to the wound twice a day. Stop any antibiotic ointment. The saline can be purchased at the drugstore or you can make your own .Boil cup of salt in a gallon of water. Store mixture  in a clean container.Report Warning  signs as discussed (red streaks, pus, fever, increasing pain). Blood Pressure Goal  Ideally is an AVERAGE < 135/85. This AVERAGE should be calculated from @ least 5-7 BP readings taken @ different times of day on different days of week. You should not respond to isolated BP readings , but rather the AVERAGE for that week

## 2011-02-23 NOTE — Progress Notes (Signed)
Addended by: Edgardo Roys on: 02/23/2011 10:46 AM   Modules accepted: Orders

## 2011-02-23 NOTE — Progress Notes (Signed)
Subjective:    Patient ID: Emily Anderson, female    DOB: [birth year], 50 y.o.   MRN: [id number]  HPI  Emily Anderson is here for a physical;acute issues include severe facial sunburn & respiratory infection      Review of Systems Respiratory tract infection Onset/symptoms:1 week ago as rattly cough Exposures (illness/environmental/extrinsic):father was ill with similar symptoms Progression of symptoms:now throat & head Treatments/response:Dayquil &Nyquil with benefit Present symptoms: Fever/chills/sweats:no Frontal headache:yes Facial pain:no Nasal purulence:no Sore throat:no Dental pain:no Lymphadenopathy:no Wheezing/shortness of breath:no Cough/sputum/hemoptysis:sputum not visualized  Associated extrinsic/allergic symptoms:itchy eyes/ sneezing:no. She has had some "vertigo" with head congestion.  She's been using topical antibiotic ointment over the sunburn.            Objective:   Physical Exam Gen.: Healthy and well-nourished in appearance. Alert, appropriate and cooperative throughout exam. Head: Normocephalic without obvious abnormalities Eyes: No corneal or conjunctival inflammation noted. Pupils equal round reactive to light and accommodation. Fundal exam is benign without hemorrhages, exudate, papilledema. Extraocular motion intact. Vision grossly normal. Ears: External  ear exam reveals no significant lesions or deformities. Canals clear .TMs normal. Hearing is grossly normal bilaterally. Nose: External nasal exam reveals no deformity or inflammation. Nasal mucosa are pink and moist. No lesions or exudates noted.  Mouth: Oral mucosa and oropharynx reveal no lesions or exudates. Teeth in good repair. Neck: No deformities, masses, or tenderness noted. Range of motion & . Thyroid normal. Lungs: Normal respiratory effort; chest expands symmetrically. Lungs are clear to auscultation without rales, wheezes, or increased work of breathing. Heart: Normal rate and rhythm.  Normal S1 and S2. No gallop, click, or rub. No murmur. Abdomen: Bowel sounds normal; abdomen soft and nontender. No masses, organomegaly or hernias noted. Genitalia: as per Gyn   .                                                                                   Musculoskeletal/extremities: No deformity or scoliosis noted of  the thoracic or lumbar spine; minimal asymmetry of thoracic musculature. No clubbing, cyanosis, edema, or deformity noted. Range of motion  normal .Tone & strength  normal.Joints normal. Nail health  good. Vascular: Carotid, radial artery, dorsalis pedis and  posterior tibial pulses are full and equal. No bruits present. Neurologic: Alert and oriented x3. Deep tendon reflexes symmetrical and normal.          Skin: Intact without suspicious lesions or rashes. Eschar @  Nose & R chin Lymph: No cervical, axillary lymphadenopathy present. Psych: Mood and affect are normal. Normally interactive                                                                                         Assessment & Plan:  #1 comprehensive physical exam; no acute findings #2 see Problem List with Assessments & Recommendations  #  3 bronchitis, acute and upper respiratory tract infection  #4 severe sunburn; good eschar formation  #5 EKG reveals borderline voltage criteria for left ventricular hypertrophy (see blood pressure). EKG is stable and unchanged compared to 07/19/2009. No ischemic changes present. Copies given to her both EKGs.  #6 memory loss, subjective not documented. If she is concerned about this I recommended a [place] heart panel after 4 months of nutritional changes as noted  Plan: see Orders

## 2011-03-03 ENCOUNTER — Telehealth: Payer: Self-pay | Admitting: *Deleted

## 2011-03-03 MED ORDER — PREDNISONE 20 MG PO TABS: ORAL_TABLET | ORAL | Status: DC

## 2011-03-03 NOTE — Telephone Encounter (Signed)
Discontinue amoxicillin. Prednisone 20 mg 1/2 pill 3 times a day with meals #9. She can also use over-the-counter Benadryl as needed based on the package insert. Please label the chart as allergic to amoxicillin.

## 2011-03-03 NOTE — Telephone Encounter (Signed)
Patient called and stated she believes she is having a allergic reaction to Amoxicillin. She is complaining that she has a rash on torso from her neck to her knees. She denies shortness of breath and. She stated she started Amoxicilin last week Thursday for 10 days.  She would like to know if she needs evaluation or a Rx.  If medication is prescribed she is requesting Rx sent to [facility] in [place].

## 2011-03-03 NOTE — Telephone Encounter (Signed)
Call placed to patient at [phone], she was informed per Dr Alwyn Ren instructions and has verbalized understanding and agrees as instructed.

## 2011-03-27 ENCOUNTER — Other Ambulatory Visit: Payer: Self-pay | Admitting: Internal Medicine

## 2012-01-08 ENCOUNTER — Other Ambulatory Visit: Payer: Self-pay | Admitting: Internal Medicine

## 2012-01-09 MED ORDER — SIMVASTATIN 40 MG PO TABS: ORAL_TABLET | ORAL | Status: DC

## 2012-01-09 NOTE — Telephone Encounter (Signed)
RX was re-ordered for message populated that it did not go through electronically

## 2012-01-09 NOTE — Addendum Note (Signed)
Addended by: Maurice Small on: 01/09/2012 05:58 PM   Modules accepted: Orders

## 2012-01-09 NOTE — Telephone Encounter (Signed)
Patient needs to schedule a CPX  

## 2012-01-23 ENCOUNTER — Other Ambulatory Visit (HOSPITAL_BASED_OUTPATIENT_CLINIC_OR_DEPARTMENT_OTHER): Payer: Self-pay | Admitting: Obstetrics and Gynecology

## 2012-01-23 DIAGNOSIS — Z1231 Encounter for screening mammogram for malignant neoplasm of breast: Secondary | ICD-10-CM

## 2012-01-25 ENCOUNTER — Other Ambulatory Visit: Payer: Self-pay

## 2012-01-25 DIAGNOSIS — T887XXA Unspecified adverse effect of drug or medicament, initial encounter: Secondary | ICD-10-CM

## 2012-01-25 DIAGNOSIS — I10 Essential (primary) hypertension: Secondary | ICD-10-CM

## 2012-01-25 DIAGNOSIS — E785 Hyperlipidemia, unspecified: Secondary | ICD-10-CM

## 2012-01-25 DIAGNOSIS — Z0000 Encounter for general adult medical examination without abnormal findings: Secondary | ICD-10-CM

## 2012-01-25 MED ORDER — SIMVASTATIN 40 MG PO TABS: ORAL_TABLET | ORAL | Status: DC

## 2012-01-25 NOTE — Telephone Encounter (Signed)
I contacted patient because we sent a rx to [facility] earlier this month and we received a response indicating patient 's prescriptions are not managed through them. Patient then advised that her insurance company switched to [facility], patient would like RX for Simvastatin sent to them. Patient was transferred to scheduling to set up CPX appointment.

## 2012-01-25 NOTE — Telephone Encounter (Signed)
Future orders placed 

## 2012-01-25 NOTE — Telephone Encounter (Signed)
Pt wants labs drawn here prior to her CPE with hopper 12.19, please enter orders  Thanks

## 2012-02-14 ENCOUNTER — Other Ambulatory Visit: Payer: Self-pay | Admitting: Internal Medicine

## 2012-02-14 MED ORDER — HYDROCHLOROTHIAZIDE 25 MG PO TABS: ORAL_TABLET | ORAL | Status: DC

## 2012-02-14 NOTE — Telephone Encounter (Signed)
refill hydrchlorothiazide 25mg  tablets #30 TK 1 T PO QAM -- last fill 10.3.13--last ov 10.25.13 V70

## 2012-02-14 NOTE — Telephone Encounter (Signed)
Rx sent 

## 2012-02-15 ENCOUNTER — Ambulatory Visit (HOSPITAL_BASED_OUTPATIENT_CLINIC_OR_DEPARTMENT_OTHER)
Admission: RE | Admit: 2012-02-15 | Discharge: 2012-02-15 | Disposition: A | Discharge status: Home / Self Care | Payer: 59 | Source: Ambulatory Visit | Attending: Obstetrics and Gynecology | Admitting: Obstetrics and Gynecology

## 2012-02-15 DIAGNOSIS — Z1231 Encounter for screening mammogram for malignant neoplasm of breast: Secondary | ICD-10-CM | POA: Insufficient documentation

## 2012-03-06 ENCOUNTER — Encounter (INDEPENDENT_AMBULATORY_CARE_PROVIDER_SITE_OTHER): Payer: 59 | Admitting: Ophthalmology

## 2012-04-11 ENCOUNTER — Other Ambulatory Visit (INDEPENDENT_AMBULATORY_CARE_PROVIDER_SITE_OTHER): Payer: 59

## 2012-04-11 DIAGNOSIS — T887XXA Unspecified adverse effect of drug or medicament, initial encounter: Secondary | ICD-10-CM

## 2012-04-11 DIAGNOSIS — I10 Essential (primary) hypertension: Secondary | ICD-10-CM

## 2012-04-11 DIAGNOSIS — Z0000 Encounter for general adult medical examination without abnormal findings: Secondary | ICD-10-CM

## 2012-04-11 DIAGNOSIS — E785 Hyperlipidemia, unspecified: Secondary | ICD-10-CM

## 2012-04-11 LAB — CBC WITH DIFFERENTIAL/PLATELET
Basophils Relative: 0.8 % (ref 0.0–3.0)
Eosinophils Relative: 5.2 % — ABNORMAL HIGH (ref 0.0–5.0)
HCT: 40.8 % (ref 36.0–46.0)
Hemoglobin: 13.9 g/dL (ref 12.0–15.0)
Lymphs Abs: 2.2 10*3/uL (ref 0.7–4.0)
Monocytes Relative: 7 % (ref 3.0–12.0)
Neutro Abs: 3.4 10*3/uL (ref 1.4–7.7)
RBC: 4.35 Mil/uL (ref 3.87–5.11)
RDW: 12.8 % (ref 11.5–14.6)
WBC: 6.4 10*3/uL (ref 4.5–10.5)

## 2012-04-11 LAB — HEPATIC FUNCTION PANEL
ALT: 28 U/L (ref 0–35)
Albumin: 3.9 g/dL (ref 3.5–5.2)
Total Bilirubin: 0.7 mg/dL (ref 0.3–1.2)

## 2012-04-11 LAB — LIPID PANEL
HDL: 42.3 mg/dL (ref >39.00)
Total CHOL/HDL Ratio: 4
VLDL: 32.6 mg/dL (ref 0.0–40.0)

## 2012-04-11 LAB — BASIC METABOLIC PANEL
GFR: 90.77 mL/min (ref >60.00)
Glucose, Bld: 92 mg/dL (ref 70–99)
Potassium: 3.3 mEq/L — ABNORMAL LOW (ref 3.5–5.1)
Sodium: 135 mEq/L (ref 135–145)

## 2012-04-18 ENCOUNTER — Encounter: Payer: Self-pay | Admitting: Internal Medicine

## 2012-04-18 ENCOUNTER — Ambulatory Visit (INDEPENDENT_AMBULATORY_CARE_PROVIDER_SITE_OTHER): Payer: 59 | Admitting: Internal Medicine

## 2012-04-18 VITALS — BP 118/80 | HR 70 | Temp 98.1°F | Resp 12 | Ht 67.03 in | Wt 180.0 lb

## 2012-04-18 DIAGNOSIS — E785 Hyperlipidemia, unspecified: Secondary | ICD-10-CM

## 2012-04-18 DIAGNOSIS — I10 Essential (primary) hypertension: Secondary | ICD-10-CM

## 2012-04-18 MED ORDER — SIMVASTATIN 40 MG PO TABS: ORAL_TABLET | ORAL | Status: DC

## 2012-04-18 MED ORDER — SPIRONOLACTONE 25 MG PO TABS: 25.0000 mg | ORAL_TABLET | Freq: Every day | ORAL | Status: DC

## 2012-04-18 NOTE — Progress Notes (Signed)
Subjective:    Patient ID: Emily Anderson, female    DOB: July 24, 1960, 51 y.o.   MRN: [id number]  HPI  Xitlali is here for a physical; acute issues include some memory loss      Review of Systems  Memory Loss: Onset: ? 1 year ago Progression/character:? Worse in context of stress Severity:irritating Impact:frustration; "I work around it" Associated Symptoms /Signs: Constitutional:Fever/chills/ sweats /fatigue/ sleep issues:no Vision changes:no Cardiovascular: chest pain, palpitations/rhythm change:no GI: bowel changes:no Endo: temp intolerance/ skin, hair, nail changes:no Neuro: headaches/ numbness& tingling/ tremor/ weakness/ gait dysfunction:no PMH: head trauma:no  FH:no dementia        Objective:   Physical Exam Gen.: Healthy and well-nourished in appearance. Alert, appropriate and cooperative throughout exam. Head: Normocephalic without obvious abnormalities Eyes: No corneal or conjunctival inflammation noted. Pupils equal round reactive to light and accommodation. Fundal exam is benign without hemorrhages, exudate, papilledema. Extraocular motion intact. Vision grossly normal. Ears: External  ear exam reveals no significant lesions or deformities. Canals clear .TMs normal. Hearing is grossly normal bilaterally. Nose: External nasal exam reveals no deformity or inflammation. Nasal mucosa are pink and moist. No lesions or exudates noted.   Mouth: Oral mucosa and oropharynx reveal no lesions or exudates. Teeth in good repair. Neck: No deformities, masses, or tenderness noted. Range of motion & Thyroid  normal. Lungs: Normal respiratory effort; chest expands symmetrically. Lungs are clear to auscultation without rales, wheezes, or increased work of breathing. Heart: Normal rate and rhythm. Normal S1 and S2. No gallop, click, or rub. Grade 1/2 over 6 systolic murmur . Abdomen: Bowel sounds normal; abdomen soft and nontender. No masses, organomegaly or hernias noted. Genitalia:  Dr Richardson's group .                                                                                   Musculoskeletal/extremities: No deformity or scoliosis noted of  the thoracic or lumbar spine. No clubbing, cyanosis, edema, or deformity noted. Range of motion  normal .Tone & strength  normal.Joints normal. Nail health  good. Vascular: Carotid, radial artery, dorsalis pedis and  posterior tibial pulses are full and equal. No bruits present. Neurologic: Alert and oriented x3. Deep tendon reflexes symmetrical and normal.          Skin: Intact without suspicious lesions or rashes. Lymph: No cervical, axillary lymphadenopathy present. Psych: Mood and affect are normal. Normally interactive      Mini-Mental Status exam was completed to evaluate the patient's concerns in reference to memory deficit. Results revealed:    30 of possible 30 points                                                                                     Assessment & Plan:  #1 comprehensive physical exam; no acute findings #2 memory deficit not documented Plan: see  Orders

## 2012-04-18 NOTE — Patient Instructions (Addendum)
Preventive Health Care: Exercise  30-45  minutes a day, 3-4 days a week. If you're not exercising you should take 6-8 weeks to build up to this level.Walking is especially valuable in preventing Osteoporosis. Eat a low-fat diet with lots of fruits and vegetables, up to 7-9 servings per day.  Consume less than 30 grams (preferably ZERO) of sugar per day from foods & drinks with High Fructose Corn Syrup as #1,2,3 or #4 on label. Please verify when follow up colonoscopy is due based on your records   Health Care Power of Attorney & Living Will place you in charge of your health care  decisions. Verify these are  in place. Please take enteric-coated aspirin 81 mg daily with breakfast.    Blood Pressure Goal  Ideally is an AVERAGE < 135/85. This AVERAGE should be calculated from @ least 5-7 BP readings taken @ different times of day on different days of week. You should not respond to isolated BP readings , but rather the AVERAGE for that week.  If you activate My Chart; the results can be released to you as soon as they populate from the lab. If you choose not to use this program; the labs have to be reviewed, copied & mailed causing a delay in getting the results to you.

## 2012-04-26 ENCOUNTER — Other Ambulatory Visit: Payer: Self-pay | Admitting: Internal Medicine

## 2012-04-26 NOTE — Telephone Encounter (Signed)
Refill done.  

## 2012-07-21 LAB — HM PAP SMEAR: HM Pap smear: NORMAL

## 2013-01-14 ENCOUNTER — Other Ambulatory Visit (HOSPITAL_BASED_OUTPATIENT_CLINIC_OR_DEPARTMENT_OTHER): Payer: Self-pay | Admitting: Obstetrics and Gynecology

## 2013-01-14 DIAGNOSIS — Z1231 Encounter for screening mammogram for malignant neoplasm of breast: Secondary | ICD-10-CM

## 2013-02-17 ENCOUNTER — Ambulatory Visit (HOSPITAL_BASED_OUTPATIENT_CLINIC_OR_DEPARTMENT_OTHER)
Admission: RE | Admit: 2013-02-17 | Discharge: 2013-02-17 | Disposition: A | Discharge status: Home / Self Care | Payer: 59 | Source: Ambulatory Visit | Attending: Obstetrics and Gynecology | Admitting: Obstetrics and Gynecology

## 2013-02-17 DIAGNOSIS — Z1231 Encounter for screening mammogram for malignant neoplasm of breast: Secondary | ICD-10-CM | POA: Insufficient documentation

## 2013-03-20 ENCOUNTER — Telehealth: Payer: Self-pay | Admitting: Internal Medicine

## 2013-03-20 NOTE — Telephone Encounter (Signed)
Patient is wanting to have CPE labs done a few days before her CPE on 1.14.15. States that she does this every year. Please enter orders.

## 2013-03-25 ENCOUNTER — Other Ambulatory Visit: Payer: Self-pay | Admitting: Internal Medicine

## 2013-03-25 DIAGNOSIS — Z0000 Encounter for general adult medical examination without abnormal findings: Secondary | ICD-10-CM

## 2013-03-25 NOTE — Telephone Encounter (Signed)
Please advise as to which orders should be entered.

## 2013-03-25 NOTE — Telephone Encounter (Signed)
Fasting Labs : BMET,Lipids, hepatic panel, CBC & dif, TSH entered

## 2013-03-25 NOTE — Telephone Encounter (Signed)
Lab appt made

## 2013-04-29 ENCOUNTER — Other Ambulatory Visit: Payer: Self-pay | Admitting: *Deleted

## 2013-04-29 MED ORDER — SPIRONOLACTONE 25 MG PO TABS: 25.0000 mg | ORAL_TABLET | Freq: Every day | ORAL | Status: DC

## 2013-04-29 MED ORDER — SIMVASTATIN 40 MG PO TABS: ORAL_TABLET | ORAL | Status: DC

## 2013-05-09 ENCOUNTER — Other Ambulatory Visit (INDEPENDENT_AMBULATORY_CARE_PROVIDER_SITE_OTHER): Payer: 59

## 2013-05-09 DIAGNOSIS — Z0000 Encounter for general adult medical examination without abnormal findings: Secondary | ICD-10-CM

## 2013-05-09 LAB — LIPID PANEL
CHOL/HDL RATIO: 4
CHOLESTEROL: 175 mg/dL (ref 0–200)
HDL: 45.4 mg/dL (ref >39.00)
LDL CALC: 102 mg/dL — AB (ref 0–99)
TRIGLYCERIDES: 140 mg/dL (ref 0.0–149.0)
VLDL: 28 mg/dL (ref 0.0–40.0)

## 2013-05-09 LAB — CBC WITH DIFFERENTIAL/PLATELET
BASOS ABS: 0 10*3/uL (ref 0.0–0.1)
Basophils Relative: 0.6 % (ref 0.0–3.0)
Eosinophils Absolute: 0.3 10*3/uL (ref 0.0–0.7)
Eosinophils Relative: 4.8 % (ref 0.0–5.0)
HEMATOCRIT: 39.1 % (ref 36.0–46.0)
Hemoglobin: 13.4 g/dL (ref 12.0–15.0)
LYMPHS ABS: 2.3 10*3/uL (ref 0.7–4.0)
Lymphocytes Relative: 34.3 % (ref 12.0–46.0)
MCHC: 34.1 g/dL (ref 30.0–36.0)
MCV: 93.9 fl (ref 78.0–100.0)
MONO ABS: 0.4 10*3/uL (ref 0.1–1.0)
Monocytes Relative: 6.7 % (ref 3.0–12.0)
NEUTROS PCT: 53.6 % (ref 43.0–77.0)
Neutro Abs: 3.6 10*3/uL (ref 1.4–7.7)
Platelets: 236 10*3/uL (ref 150.0–400.0)
RBC: 4.17 Mil/uL (ref 3.87–5.11)
RDW: 13.1 % (ref 11.5–14.6)
WBC: 6.6 10*3/uL (ref 4.5–10.5)

## 2013-05-09 LAB — BASIC METABOLIC PANEL
BUN: 13 mg/dL (ref 6–23)
CO2: 26 mEq/L (ref 19–32)
CREATININE: 0.8 mg/dL (ref 0.4–1.2)
Calcium: 9.5 mg/dL (ref 8.4–10.5)
Chloride: 106 mEq/L (ref 96–112)
GFR: 81.21 mL/min (ref >60.00)
GLUCOSE: 92 mg/dL (ref 70–99)
POTASSIUM: 3.7 meq/L (ref 3.5–5.1)
Sodium: 138 mEq/L (ref 135–145)

## 2013-05-09 LAB — HEPATIC FUNCTION PANEL
ALT: 36 U/L — ABNORMAL HIGH (ref 0–35)
AST: 29 U/L (ref 0–37)
Albumin: 3.8 g/dL (ref 3.5–5.2)
Alkaline Phosphatase: 42 U/L (ref 39–117)
BILIRUBIN DIRECT: 0 mg/dL (ref 0.0–0.3)
Total Bilirubin: 0.5 mg/dL (ref 0.3–1.2)
Total Protein: 7.1 g/dL (ref 6.0–8.3)

## 2013-05-09 LAB — TSH: TSH: 4.06 u[IU]/mL (ref 0.35–5.50)

## 2013-05-13 ENCOUNTER — Telehealth: Payer: Self-pay

## 2013-05-13 NOTE — Telephone Encounter (Signed)
[facility]- 06/05/07-Dr. Ardis Hughs- Normal-Due again in 10 years. [facility]-02/17/13

## 2013-05-14 ENCOUNTER — Ambulatory Visit (INDEPENDENT_AMBULATORY_CARE_PROVIDER_SITE_OTHER): Payer: 59 | Admitting: Internal Medicine

## 2013-05-14 ENCOUNTER — Encounter: Payer: Self-pay | Admitting: Internal Medicine

## 2013-05-14 VITALS — BP 121/79 | HR 72 | Temp 98.6°F | Resp 12 | Ht 67.5 in | Wt 181.4 lb

## 2013-05-14 DIAGNOSIS — I10 Essential (primary) hypertension: Secondary | ICD-10-CM

## 2013-05-14 DIAGNOSIS — Z0000 Encounter for general adult medical examination without abnormal findings: Secondary | ICD-10-CM

## 2013-05-14 DIAGNOSIS — E785 Hyperlipidemia, unspecified: Secondary | ICD-10-CM

## 2013-05-14 DIAGNOSIS — Z23 Encounter for immunization: Secondary | ICD-10-CM

## 2013-05-14 MED ORDER — SPIRONOLACTONE 25 MG PO TABS: 25.0000 mg | ORAL_TABLET | Freq: Every day | ORAL | Status: DC

## 2013-05-14 NOTE — Addendum Note (Signed)
Addended by: Harl Bowie on: 05/14/2013 10:58 AM   Modules accepted: Orders

## 2013-05-14 NOTE — Patient Instructions (Signed)
Roll the affected foot over a tennis ball 20 times twice a day. After this soak the foot in warm Epsom salts for 15-20 minutes. Wear arch supports in both shoes. Podiatry referral if symptoms persist. Consider glucosamine sulfate 1500 mg daily for the fasciitis for 4-6 weeks ;this will rehydrate the cartilages.  Reflux of gastric acid may be asymptomatic as this may occur mainly during sleep.The triggers for reflux  include stress; the "aspirin family" ; alcohol; peppermint; and caffeine (coffee, tea, cola, and chocolate). The aspirin family would include aspirin and the nonsteroidal agents such as ibuprofen &  Naproxen. Tylenol would not cause reflux. If having symptoms ; food & drink should be avoided for @ least 2 hours before going to bed.

## 2013-05-14 NOTE — Progress Notes (Signed)
Pre visit review using our clinic review tool, if applicable. No additional management support is needed unless otherwise documented below in the visit note. 

## 2013-05-14 NOTE — Progress Notes (Signed)
   Subjective:    Patient ID: Emily Anderson, female    DOB: Oct 04, 1960, 53 y.o.   MRN: [id number]  HPI  She is here for a physical;acute issues include plantar fasciitis.     Review of Systems A heart healthy diet is followed; exercise encompasses 60- 120 minutes up to 5  times per week as cardio,yoga & weights without symptoms.  Family history is negative for premature coronary disease. Advanced cholesterol testing reveals  LDL goal is less than 100 ; ideally < 70 . There is medication compliance with the statin.  Low dose ASA not taken Specifically denied are  chest pain, palpitations, dyspnea, or claudication.  Significant abdominal symptoms, memory deficit, or myalgias not present.     Objective:   Physical Exam Gen.: Healthy and well-nourished in appearance. Alert, appropriate and cooperative throughout exam.Appears younger than stated age  Head: Normocephalic without obvious abnormalities Eyes: No corneal or conjunctival inflammation noted. Pupils equal round reactive to light and accommodation. Extraocular motion intact. Fundal exam is benign without hemorrhages, exudate, papilledema.  Vision grossly normal with lenses Ears: External  ear exam reveals no significant lesions or deformities. Canals clear .TMs normal. Hearing is grossly normal bilaterally. Nose: External nasal exam reveals no deformity or inflammation. Nasal mucosa are pink and moist. No lesions or exudates noted. Septum midline.   Mouth: Oral mucosa and oropharynx reveal no lesions or exudates. Teeth in good repair. Neck: No deformities, masses, or tenderness noted. Full range of motion . Thyroid normal. Lungs: Normal respiratory effort; chest expands symmetrically. Lungs are clear to auscultation without rales, wheezes, or increased work of breathing. Heart: Normal rate and rhythm. Normal S1 and S2. No gallop, click, or rub. Grade 1/2 over 6 systolic murmur. Abdomen: Bowel sounds normal; abdomen soft and  nontender. No masses, organomegaly or hernias noted. Genitalia:  as per Gyn                                  Musculoskeletal/extremities: No deformity or scoliosis noted of  the thoracic or lumbar spine.  No clubbing, cyanosis, edema, or significant extremity  deformity noted. Range of motion normal .Tone & strength normal. Hand joints normal . Fingernail / toenail health good. R sole tender to percussion Able to lie down & sit up w/o help. Negative SLR bilaterally Vascular: Carotid, radial artery, dorsalis pedis and  posterior tibial pulses are full and equal. No bruits present. Neurologic: Alert and oriented x3. Deep tendon reflexes symmetrical and normal.        Skin: Intact without suspicious lesions or rashes. Lymph: No cervical, axillary lymphadenopathy present. Psych: Mood and affect are normal. Normally interactive                                                                                        Assessment & Plan:  #1 comprehensive physical exam; no acute findings  Plan: see Orders  & Recommendations

## 2013-06-04 ENCOUNTER — Telehealth: Payer: Self-pay | Admitting: Internal Medicine

## 2013-06-04 NOTE — Telephone Encounter (Signed)
Relevant patient education mailed to patient.  

## 2013-09-01 ENCOUNTER — Ambulatory Visit (INDEPENDENT_AMBULATORY_CARE_PROVIDER_SITE_OTHER): Payer: 59 | Admitting: Internal Medicine

## 2013-09-01 ENCOUNTER — Encounter: Payer: Self-pay | Admitting: Internal Medicine

## 2013-09-01 ENCOUNTER — Other Ambulatory Visit: Payer: Self-pay | Admitting: Internal Medicine

## 2013-09-01 ENCOUNTER — Other Ambulatory Visit (INDEPENDENT_AMBULATORY_CARE_PROVIDER_SITE_OTHER): Payer: 59

## 2013-09-01 VITALS — BP 122/80 | HR 62 | Temp 98.7°F | Resp 12 | Wt 178.4 lb

## 2013-09-01 DIAGNOSIS — E785 Hyperlipidemia, unspecified: Secondary | ICD-10-CM

## 2013-09-01 DIAGNOSIS — I10 Essential (primary) hypertension: Secondary | ICD-10-CM

## 2013-09-01 LAB — BASIC METABOLIC PANEL
BUN: 12 mg/dL (ref 6–23)
CALCIUM: 9.3 mg/dL (ref 8.4–10.5)
CO2: 26 mEq/L (ref 19–32)
Chloride: 106 mEq/L (ref 96–112)
Creatinine, Ser: 0.8 mg/dL (ref 0.4–1.2)
GFR: 79.94 mL/min (ref >60.00)
GLUCOSE: 93 mg/dL (ref 70–99)
Potassium: 4.2 mEq/L (ref 3.5–5.1)
SODIUM: 138 meq/L (ref 135–145)

## 2013-09-01 NOTE — Progress Notes (Signed)
   Subjective:    Patient ID: Emily Anderson, female    DOB: 13-Jun-1960, 53 y.o.   MRN: [id number]  HPI A heart healthy diet is followed; exercise encompasses 60-120 minutes > 4  times per week as  Cardio, weights & yoga without symptoms.  Family history is neg for premature coronary disease. Advanced cholesterol testing reveals  LDL goal is less than 100 ; ideally < 70 . Off  the statin since 12/14 Low dose ASA not taken  Review of Systems  Specifically denied are  chest pain, palpitations, dyspnea, or claudication.     Objective:   Physical Exam  Appears healthy and well-nourished & in no acute distress  No carotid bruits are present.No neck pain distention present at 10 - 15 degrees. Thyroid normal to palpation  Heart rhythm and rate are normal with no gallop or murmur  Chest is clear with no increased work of breathing  There is no evidence of aortic aneurysm or renal artery bruits  Abdomen soft with no organomegaly or masses. No HJR  No clubbing, cyanosis or edema present.  Pedal pulses are intact   No ischemic skin changes are present . Fingernails healthy   Alert and oriented. Strength, tone, DTRs reflexes normal         Assessment & Plan:  See Current Assessment & Plan in Problem List under specific Diagnosis

## 2013-09-01 NOTE — Patient Instructions (Signed)

## 2013-09-01 NOTE — Progress Notes (Signed)
Pre visit review using our clinic review tool, if applicable. No additional management support is needed unless otherwise documented below in the visit note. 

## 2013-09-01 NOTE — Assessment & Plan Note (Signed)
Blood pressure goals reviewed. BMET 

## 2013-09-01 NOTE — Assessment & Plan Note (Signed)
Repeat NMR Lipoprofile

## 2013-09-02 LAB — NMR LIPOPROFILE WITH LIPIDS
Cholesterol, Total: 225 mg/dL — ABNORMAL HIGH (ref <200)
HDL Particle Number: 40.9 umol/L (ref >=30.5)
HDL SIZE: 8.6 nm — AB (ref >=9.2)
HDL-C: 56 mg/dL (ref >=40)
LARGE HDL: 4.4 umol/L — AB (ref >=4.8)
LDL (calc): 132 mg/dL — ABNORMAL HIGH (ref <100)
LDL Particle Number: 2456 nmol/L — ABNORMAL HIGH (ref <1000)
LDL SIZE: 20.2 nm — AB (ref >20.5)
LP-IR Score: 66 — ABNORMAL HIGH (ref <=45)
Large VLDL-P: 2.2 nmol/L (ref <=2.7)
Small LDL Particle Number: 1610 nmol/L — ABNORMAL HIGH (ref <=527)
Triglycerides: 187 mg/dL — ABNORMAL HIGH (ref <150)
VLDL Size: 47.6 nm — ABNORMAL HIGH (ref <=46.6)

## 2013-09-03 ENCOUNTER — Encounter: Payer: Self-pay | Admitting: Internal Medicine

## 2014-01-19 ENCOUNTER — Other Ambulatory Visit (HOSPITAL_BASED_OUTPATIENT_CLINIC_OR_DEPARTMENT_OTHER): Payer: Self-pay | Admitting: Obstetrics and Gynecology

## 2014-01-19 DIAGNOSIS — Z1231 Encounter for screening mammogram for malignant neoplasm of breast: Secondary | ICD-10-CM

## 2014-02-23 ENCOUNTER — Ambulatory Visit (HOSPITAL_BASED_OUTPATIENT_CLINIC_OR_DEPARTMENT_OTHER)
Admission: RE | Admit: 2014-02-23 | Discharge: 2014-02-23 | Disposition: A | Discharge status: Home / Self Care | Payer: 59 | Source: Ambulatory Visit | Attending: Obstetrics and Gynecology | Admitting: Obstetrics and Gynecology

## 2014-02-23 DIAGNOSIS — Z1231 Encounter for screening mammogram for malignant neoplasm of breast: Secondary | ICD-10-CM | POA: Insufficient documentation

## 2014-02-26 ENCOUNTER — Other Ambulatory Visit: Payer: Self-pay | Admitting: Obstetrics and Gynecology

## 2014-02-26 DIAGNOSIS — R928 Other abnormal and inconclusive findings on diagnostic imaging of breast: Secondary | ICD-10-CM

## 2014-03-12 ENCOUNTER — Ambulatory Visit
Admission: RE | Admit: 2014-03-12 | Discharge: 2014-03-12 | Disposition: A | Discharge status: Home / Self Care | Payer: 59 | Source: Ambulatory Visit | Attending: Obstetrics and Gynecology | Admitting: Obstetrics and Gynecology

## 2014-03-12 DIAGNOSIS — R928 Other abnormal and inconclusive findings on diagnostic imaging of breast: Secondary | ICD-10-CM

## 2014-04-15 ENCOUNTER — Encounter: Payer: Self-pay | Admitting: Internal Medicine

## 2014-04-15 ENCOUNTER — Ambulatory Visit (INDEPENDENT_AMBULATORY_CARE_PROVIDER_SITE_OTHER): Payer: 59 | Admitting: Internal Medicine

## 2014-04-15 ENCOUNTER — Other Ambulatory Visit (INDEPENDENT_AMBULATORY_CARE_PROVIDER_SITE_OTHER): Payer: 59

## 2014-04-15 VITALS — BP 110/78 | HR 76 | Temp 98.4°F | Wt 184.2 lb

## 2014-04-15 DIAGNOSIS — R202 Paresthesia of skin: Secondary | ICD-10-CM

## 2014-04-15 DIAGNOSIS — E785 Hyperlipidemia, unspecified: Secondary | ICD-10-CM

## 2014-04-15 DIAGNOSIS — M25551 Pain in right hip: Secondary | ICD-10-CM

## 2014-04-15 LAB — T4, FREE: Free T4: 0.71 ng/dL (ref 0.60–1.60)

## 2014-04-15 LAB — TSH: TSH: 3.77 u[IU]/mL (ref 0.35–4.50)

## 2014-04-15 NOTE — Progress Notes (Signed)
Pre visit review using our clinic review tool, if applicable. No additional management support is needed unless otherwise documented below in the visit note. 

## 2014-04-15 NOTE — Progress Notes (Signed)
   Subjective:    Patient ID: Emily Anderson, female    DOB: 1960-10-20, 53 y.o.   MRN: [id number]  HPI She came in because she is having tingling in her hands at night and was concerned that this might be related to her dyslipidemia  In January on a statin her LDL was 102, HDL 45.4, and triglycerides 140. At her request the simvastatin was discontinued.  An advanced cholesterol panel was performed 09/01/13. Her LDL was 132 with total particle number of 2456 and small dense particle #1610. HDL was 56 and triglycerides 187. Her LP-IR was 66.  She is on no specific diet but does describe a modified heart healthy program. She is exercising 3 times a week 2 hours as cardiovascular and one hour of yoga. She denies any cardiopulmonary symptoms with exercise.  Her father had a stent at 25 and again @ 67.   Review of Systems   Chest pain, palpitations, tachycardia, exertional dyspnea, paroxysmal nocturnal dyspnea, claudication or edema are absent.  She has been having intermittent pain in the right hip. 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        Objective:   Physical Exam Appears healthy and well-nourished & in no acute distress  No carotid bruits are present.No neck vein distention present at 10 - 15 degrees. Thyroid normal to palpation  Heart rhythm and rate are normal with no gallop or murmur  Chest is clear with no increased work of breathing  There is no evidence of aortic aneurysm or renal artery bruits  Abdomen soft with no organomegaly or masses. No HJR  No clubbing, cyanosis or edema present.  Pedal pulses are intact   No ischemic skin changes are present . Fingernails healthy   Alert and oriented. Strength, tone, DTRs reflexes normal. Tinel's negative  There is full range of motion of the right lower extremity and hip without pain. She is able to elevate the leg to 90 without symptoms.          Assessment & Plan:  #1 probable  carpal tunnel syndrome. TFT will be ordered& wrist splints employed.  #2 right hip pain; probably bursitis. Sports medicine referral recommended  #3 dyslipidemia; the risks and options were discussed in detail. At this time she is considering making additional lifestyle changes with repeat fasting lipids in 4 months.

## 2014-04-15 NOTE — Patient Instructions (Signed)
Wear a wrist splint overnight on the hand which is most affected. If this is effective; wear one bilaterally.

## 2014-05-15 ENCOUNTER — Ambulatory Visit: Payer: 59 | Admitting: Family Medicine

## 2014-05-19 ENCOUNTER — Ambulatory Visit (INDEPENDENT_AMBULATORY_CARE_PROVIDER_SITE_OTHER): Payer: 59 | Admitting: Family Medicine

## 2014-05-19 ENCOUNTER — Encounter: Payer: Self-pay | Admitting: Family Medicine

## 2014-05-19 VITALS — BP 118/82 | HR 66 | Ht 68.0 in | Wt 188.0 lb

## 2014-05-19 DIAGNOSIS — M7061 Trochanteric bursitis, right hip: Secondary | ICD-10-CM

## 2014-05-19 MED ORDER — MELOXICAM 15 MG PO TABS: 15.0000 mg | ORAL_TABLET | Freq: Every day | ORAL | Status: DC

## 2014-05-19 NOTE — Patient Instructions (Signed)
Nice to meet you Ice 20 minutes 2 times daily. Usually after activity and before bed. Exercises 3 times a week.  Keep going to the gym. Turmeric 500mg  twice daily Meloxicam daily for 10 days with food then as needed See me again in 3 weeks.

## 2014-05-19 NOTE — Assessment & Plan Note (Signed)
Patient at this point has elected to do more of a conservative therapy. Patient was given up her prescription for oral anti-inflammatories, patient did spent time with A [facility] [employer] to Learn Home Exercises in Greater Detail. We Discussed Icing Regimen. Patient Also Is Going to Try Some Over-The-Counter Medications. Patient Will Come Back and See Me Again in 3 Weeks. If Continuing to Have Pain We Can Consider Ultrasound and Possibly Ultrasound Guided Injection. I Believe Patient Will Do Very Well with Conservative Therapy.

## 2014-05-19 NOTE — Progress Notes (Signed)
  [name] [place], [place] [address] Phone: [phone] Subjective:    I'm seeing this patient by the request  of:  Unice Cobble, MD   CC: Right hip bursitis.   ZWC:HENIDPOEUM Emily Anderson is a 54 y.o. female coming in with complaint of right hip pain. Patient states that this has been an insidious onset over the last 3-4 months. Patient states it is more of a dull aching pain that occurs after sitting long amount of time or sometimes with activity. Patient denies any pain at night. Denies any radiation down the leg or any associate of back pain. Patient denies any weakness in the leg but states that she has become much more aware of this leg compared to the other leg. This is not stopping her from any activities of daily living. Patient puts the severity of pain is 4 out of 10. Has not tried any home modalities at this time. Patient was doing some leg lifting at the gym and has stopped that because she thinks it may have been exacerbated.     Past medical history, social, surgical and family history all reviewed in electronic medical record.   Review of Systems: No headache, visual changes, nausea, vomiting, diarrhea, constipation, dizziness, abdominal pain, skin rash, fevers, chills, night sweats, weight loss, swollen lymph nodes, body aches, joint swelling, muscle aches, chest pain, shortness of breath, mood changes.   Objective Blood pressure 118/82, pulse 66, height 5\' 8"  (1.727 m), weight 188 lb (85.276 kg), SpO2 96 %.  General: No apparent distress alert and oriented x3 mood and affect normal, dressed appropriately.  HEENT: Pupils equal, extraocular movements intact  Respiratory: Patient's speak in full sentences and does not appear short of breath  Cardiovascular: No lower extremity edema, non tender, no erythema  Skin: Warm dry intact with no signs of infection or rash on extremities or on axial skeleton.  Abdomen: Soft nontender    Neuro: Cranial nerves II through XII are intact, neurovascularly intact in all extremities with 2+ DTRs and 2+ pulses.  Lymph: No lymphadenopathy of posterior or anterior cervical chain or axillae bilaterally.  Gait normal with good balance and coordination.  MSK:  Non tender with full range of motion and good stability and symmetric strength and tone of shoulders, elbows, wrist,  knee and ankles bilaterally.   Hip: right ROM IR: 25 Deg, ER: 45 Deg, Flexion: 120 Deg, Extension: 100 Deg, Abduction: 45 Deg, Adduction: 35 Deg Strength IR: 5/5, ER: 5/5, Flexion: 5/5, Extension: 5/5, Abduction:45/5, Adduction: 5/5 Pelvic alignment unremarkable to inspection and palpation. Standing hip rotation and gait without trendelenburg sign / unsteadiness. Greater trochanter tender to palpation No tenderness over piriformis and greater trochanter. Positive Faber with pain over the lateral epicondylar region No SI joint tenderness and normal minimal SI movement. Contra lateral hip unremarkable  Procedure note [id number]; 15 minutes spent for Therapeutic exercises as stated in above notes.  This included exercises focusing on stretching, strengthening, with significant focus on eccentric aspects.   Proper technique shown and discussed handout in great detail with ATC.  All questions were discussed and answered.     Impression and Recommendations:     This case required medical decision making of moderate complexity.

## 2014-06-11 ENCOUNTER — Encounter: Payer: Self-pay | Admitting: Family Medicine

## 2014-06-11 ENCOUNTER — Ambulatory Visit (INDEPENDENT_AMBULATORY_CARE_PROVIDER_SITE_OTHER): Payer: 59 | Admitting: Family Medicine

## 2014-06-11 ENCOUNTER — Other Ambulatory Visit (INDEPENDENT_AMBULATORY_CARE_PROVIDER_SITE_OTHER): Payer: 59

## 2014-06-11 VITALS — BP 116/74 | HR 69 | Ht 67.5 in | Wt 188.0 lb

## 2014-06-11 DIAGNOSIS — M25551 Pain in right hip: Secondary | ICD-10-CM

## 2014-06-11 DIAGNOSIS — M7061 Trochanteric bursitis, right hip: Secondary | ICD-10-CM

## 2014-06-11 NOTE — Progress Notes (Signed)
Pre visit review using our clinic review tool, if applicable. No additional management support is needed unless otherwise documented below in the visit note. 

## 2014-06-11 NOTE — Progress Notes (Signed)
[name] [place] [place], [place] [address] Phone: [phone] Subjective:    I'm seeing this patient by the request  of:  Unice Cobble, MD   CC: Right hip bursitis.   WIO:[id number] Emily Anderson is a 54 y.o. female coming in with complaint of right hip pain. Patient was seen previously. Patient has been taking meloxicam and when she was taking and she was doing significantly better. Patient states that she had swelling in her extremity is a when she took medicine. Patient has stopped the medication because of this but importantly the pain on the lateral aspect of the hip has started again. Denies any numbness or tingling. This is stopping her from working out on a regular basis. Denies any radiation down the legs or any weakness.     Past medical history, social, surgical and family history all reviewed in electronic medical record.   Review of Systems: No headache, visual changes, nausea, vomiting, diarrhea, constipation, dizziness, abdominal pain, skin rash, fevers, chills, night sweats, weight loss, swollen lymph nodes, body aches, joint swelling, muscle aches, chest pain, shortness of breath, mood changes.   Objective Blood pressure 116/74, pulse 69, height 5' 7.5" (1.715 m), weight 188 lb (85.276 kg), SpO2 95 %.  General: No apparent distress alert and oriented x3 mood and affect normal, dressed appropriately.  HEENT: Pupils equal, extraocular movements intact  Respiratory: Patient's speak in full sentences and does not appear short of breath  Cardiovascular: No lower extremity edema, non tender, no erythema  Skin: Warm dry intact with no signs of infection or rash on extremities or on axial skeleton.  Abdomen: Soft nontender  Neuro: Cranial nerves II through XII are intact, neurovascularly intact in all extremities with 2+ DTRs and 2+ pulses.  Lymph: No lymphadenopathy of posterior or anterior cervical chain or axillae bilaterally.    Gait normal with good balance and coordination.  MSK:  Non tender with full range of motion and good stability and symmetric strength and tone of shoulders, elbows, wrist,  knee and ankles bilaterally.   Hip: right ROM IR: 25 Deg, ER: 45 Deg, Flexion: 120 Deg, Extension: 100 Deg, Abduction: 45 Deg, Adduction: 35 Deg Strength IR: 5/5, ER: 5/5, Flexion: 5/5, Extension: 5/5, Abduction:45/5, Adduction: 5/5 Pelvic alignment unremarkable to inspection and palpation. Standing hip rotation and gait without trendelenburg sign / unsteadiness. Greater trochanter tender to palpation and worsen previous exam No tenderness over piriformis and greater trochanter. Positive Faber with pain over the lateral epicondylar region No SI joint tenderness and normal minimal SI movement. Contra lateral hip unremarkable  MSK US performed of: Right This study was ordered, performed, and interpreted by Charlann Boxer D.O.  Hip: Trochanteric bursa with significant hypoechoic changes and swelling Acetabular labrum visualized and without tears, displacement, or effusion in joint. Femoral neck appears unremarkable without increased power doppler signal along Cortex.  IMPRESSION:  Greater trochanter bursitis   Procedure: Real-time Ultrasound Guided Injection of right greater trochanteric bursitis secondary to patient's body habitus Device: GE Logiq E  Ultrasound guided injection is preferred based studies that show increased duration, increased effect, greater accuracy, decreased procedural pain, increased response rate, and decreased cost with ultrasound guided versus blind injection.  Verbal informed consent obtained.  Time-out conducted.  Noted no overlying erythema, induration, or other signs of local infection.  Skin prepped in a sterile fashion.  Local anesthesia: Topical Ethyl chloride.  With sterile technique and under real time ultrasound guidance:  Greater trochanteric  area was visualized and patient's  bursa was noted. A 22-gauge 3 inch needle was inserted and 4 cc of 0.5% Marcaine and 1 cc of Kenalog 40 mg/dL was injected. Pictures taken Completed without difficulty  Pain immediately resolved suggesting accurate placement of the medication.  Advised to call if fevers/chills, erythema, induration, drainage, or persistent bleeding.  Images permanently stored and available for review in the ultrasound unit.  Impression: Technically successful ultrasound guided injection.     Impression and Recommendations:     This case required medical decision making of moderate complexity.

## 2014-06-11 NOTE — Assessment & Plan Note (Signed)
Patient given an injection today. Encourage patient to continue home exercises and patient declined formal therapy. We discussed the icing regimen and patient given tramadol topical anti-inflammatory. Patient will try this and report back in 3 weeks.

## 2014-06-11 NOTE — Patient Instructions (Signed)
Good to see you Emily Anderson is still your friend Exercises 3 times a week.  Exercises on wall.  Heel and butt touching.  Raise leg 6 inches and hold 2 seconds.  Down slow for count of 4 seconds.  1 set of 30 reps daily on both sides.  Pennsaid twice daily See me again in 3 weeks.

## 2014-07-01 ENCOUNTER — Ambulatory Visit: Payer: 59 | Admitting: Family Medicine

## 2014-07-20 ENCOUNTER — Telehealth: Payer: Self-pay | Admitting: Internal Medicine

## 2014-07-20 MED ORDER — SPIRONOLACTONE 25 MG PO TABS: 25.0000 mg | ORAL_TABLET | Freq: Every day | ORAL | Status: DC

## 2014-07-20 NOTE — Telephone Encounter (Signed)
Patient stated that the pharmacy haven't received the fax for the medication Spironolactone 25 mg quantity  90, sent [facility] [id number] - [place], [place] AT [address] [place] [phone] (Phone) [phone] (Fax)

## 2014-09-30 ENCOUNTER — Encounter: Payer: Self-pay | Admitting: Gastroenterology

## 2015-02-09 ENCOUNTER — Other Ambulatory Visit (HOSPITAL_BASED_OUTPATIENT_CLINIC_OR_DEPARTMENT_OTHER): Payer: Self-pay | Admitting: Obstetrics and Gynecology

## 2015-02-09 DIAGNOSIS — Z1231 Encounter for screening mammogram for malignant neoplasm of breast: Secondary | ICD-10-CM

## 2015-02-10 ENCOUNTER — Encounter: Payer: 59 | Admitting: Internal Medicine

## 2015-02-11 ENCOUNTER — Ambulatory Visit (INDEPENDENT_AMBULATORY_CARE_PROVIDER_SITE_OTHER): Payer: 59 | Admitting: Internal Medicine

## 2015-02-11 ENCOUNTER — Encounter: Payer: Self-pay | Admitting: Internal Medicine

## 2015-02-11 VITALS — BP 132/84 | HR 61 | Temp 98.4°F | Resp 16 | Ht 68.0 in | Wt 181.0 lb

## 2015-02-11 DIAGNOSIS — Z23 Encounter for immunization: Secondary | ICD-10-CM

## 2015-02-11 DIAGNOSIS — Z0000 Encounter for general adult medical examination without abnormal findings: Secondary | ICD-10-CM | POA: Diagnosis not present

## 2015-02-11 MED ORDER — SPIRONOLACTONE 25 MG PO TABS: 25.0000 mg | ORAL_TABLET | Freq: Every day | ORAL | Status: DC

## 2015-02-11 NOTE — Progress Notes (Signed)
Pre visit review using our clinic review tool, if applicable. No additional management support is needed unless otherwise documented below in the visit note. 

## 2015-02-11 NOTE — Progress Notes (Signed)
   Subjective:    Patient ID: Emily Anderson, female    DOB: 29-Sep-1960, 54 y.o.   MRN: [id number]  HPI The patient is here for a physical to assess status of active health conditions.  PMH, FH, & Social History reviewed & updated.No change in [place] as recorded.  She has been compliant with her medications without adverse effects. She is generally on a heart healthy diet but will have red meat or fried foods 1-2 times per week.  She exercises 3-4 times a week for 1 hour on the treadmill; cycling 20 minutes; with weights; and yoga for 60 minutes a week without associated cardiopulmonary symptoms. She's not monitoring blood pressure at home.  She has had intermittent chest tightness/pressure over the upper chest. This did awaken her 1 time. It has been associated with quivering or racing on occasion.It does not radiate & is not associated with nausea or diaphoresis. This lasts several minutes. She attributes this to stress at work.  She previously was on meloxicam for her hip; this was stopped when cortisone injection resolved the symptoms  She does take Advil at bedtime every couple weeks.  Review of systems is positive for some thickening of her toenails.  She has nocturia 1 times a night.  Review of Systems Exertional dyspnea, paroxysmal nocturnal dyspnea, claudication or edema are absent. No unexplained weight loss, abdominal pain, significant dyspepsia, dysphagia, melena, rectal bleeding, or persistently small caliber stools. Dysuria, pyuria, hematuria, frequency,  or polyuria are denied. Change in hair or skin denied. No bowel changes of constipation or diarrhea. No intolerance to heat or cold.     Objective:   Physical Exam  Pertinent or positive findings include: She has minor crepitus of the knees.  General appearance :adequately nourished; in no distress.  Eyes: No conjunctival inflammation or scleral icterus is present.  Oral exam:  Lips and gums are healthy  appearing.There is no oropharyngeal erythema or exudate noted. Dental hygiene is good.  Heart:  Normal rate and regular rhythm. S1 and S2 normal without gallop, murmur, click, rub or other extra sounds    Lungs:Chest clear to auscultation; no wheezes, rhonchi,rales ,or rubs present.No increased work of breathing.   Abdomen: bowel sounds normal, soft and non-tender without masses, organomegaly or hernias noted.  No guarding or rebound. No flank tenderness to percussion.  Vascular : all pulses equal ; no bruits present.  Skin:Warm & dry.  Intact without suspicious lesions or rashes ; no tenting or jaundice   Lymphatic: No lymphadenopathy is noted about the head, neck, axilla.   Neuro: Strength, tone & DTRs normal.      Assessment & Plan:  #1 comprehensive physical exam; no acute findings  #2 stress-induced chest pressure without relationship to exercise.   EKG reveals inversion of T-wave in lead 3 and decreased T voltage in aVF. There is no significant change compared to 04/18/2012.  Possibility of taking an SSRI was discussed with her. She wants to defer this and work on how she responds to stress at work.  Plan: see Orders  & Recommendations

## 2015-02-11 NOTE — Patient Instructions (Signed)
  Your next office appointment will be determined based upon review of your pending labs .  Those written interpretation of the lab results and instructions will be transmitted to you by My Chart   Critical results will be called.   Followup as needed for any active or acute issue. Please report any significant change in your symptoms.  Reflux of gastric acid may be asymptomatic as this may occur mainly during sleep.The triggers for reflux  include stress; the "aspirin family" ; alcohol; peppermint; and caffeine (coffee, tea, cola, and chocolate). The aspirin family would include aspirin and the nonsteroidal agents such as ibuprofen &  Naproxen. Tylenol would not cause reflux. If having symptoms ; food & drink should be avoided for @ least 2 hours before going to bed. Take the ranitidine 30 minutes before breakfast and evening meal if nighttime symptoms persist.

## 2015-02-12 ENCOUNTER — Other Ambulatory Visit (INDEPENDENT_AMBULATORY_CARE_PROVIDER_SITE_OTHER): Payer: 59

## 2015-02-12 DIAGNOSIS — Z0189 Encounter for other specified special examinations: Secondary | ICD-10-CM | POA: Diagnosis not present

## 2015-02-12 DIAGNOSIS — Z0000 Encounter for general adult medical examination without abnormal findings: Secondary | ICD-10-CM

## 2015-02-12 LAB — LIPID PANEL
CHOLESTEROL: 242 mg/dL — AB (ref 0–200)
HDL: 55.2 mg/dL (ref >39.00)
LDL Cholesterol: 158 mg/dL — ABNORMAL HIGH (ref 0–99)
NonHDL: 187.23
TRIGLYCERIDES: 148 mg/dL (ref 0.0–149.0)
Total CHOL/HDL Ratio: 4
VLDL: 29.6 mg/dL (ref 0.0–40.0)

## 2015-02-12 LAB — BASIC METABOLIC PANEL
BUN: 16 mg/dL (ref 6–23)
CO2: 28 meq/L (ref 19–32)
Calcium: 9.3 mg/dL (ref 8.4–10.5)
Chloride: 103 mEq/L (ref 96–112)
Creatinine, Ser: 0.82 mg/dL (ref 0.40–1.20)
GFR: 77.27 mL/min (ref >60.00)
GLUCOSE: 89 mg/dL (ref 70–99)
POTASSIUM: 4 meq/L (ref 3.5–5.1)
SODIUM: 138 meq/L (ref 135–145)

## 2015-02-12 LAB — HEPATIC FUNCTION PANEL
ALBUMIN: 4 g/dL (ref 3.5–5.2)
ALT: 35 U/L (ref 0–35)
AST: 22 U/L (ref 0–37)
Alkaline Phosphatase: 67 U/L (ref 39–117)
Bilirubin, Direct: 0.1 mg/dL (ref 0.0–0.3)
Total Bilirubin: 0.5 mg/dL (ref 0.2–1.2)
Total Protein: 7.2 g/dL (ref 6.0–8.3)

## 2015-02-12 LAB — CBC WITH DIFFERENTIAL/PLATELET
BASOS PCT: 0.4 % (ref 0.0–3.0)
Basophils Absolute: 0 10*3/uL (ref 0.0–0.1)
EOS PCT: 4.3 % (ref 0.0–5.0)
Eosinophils Absolute: 0.3 10*3/uL (ref 0.0–0.7)
HCT: 40.5 % (ref 36.0–46.0)
Hemoglobin: 13.8 g/dL (ref 12.0–15.0)
LYMPHS ABS: 1.5 10*3/uL (ref 0.7–4.0)
Lymphocytes Relative: 21 % (ref 12.0–46.0)
MCHC: 34 g/dL (ref 30.0–36.0)
MCV: 93.8 fl (ref 78.0–100.0)
MONOS PCT: 10.6 % (ref 3.0–12.0)
Monocytes Absolute: 0.8 10*3/uL (ref 0.1–1.0)
NEUTROS ABS: 4.6 10*3/uL (ref 1.4–7.7)
NEUTROS PCT: 63.7 % (ref 43.0–77.0)
PLATELETS: 223 10*3/uL (ref 150.0–400.0)
RBC: 4.32 Mil/uL (ref 3.87–5.11)
RDW: 13.2 % (ref 11.5–15.5)
WBC: 7.3 10*3/uL (ref 4.0–10.5)

## 2015-02-12 LAB — TSH: TSH: 4.65 u[IU]/mL — AB (ref 0.35–4.50)

## 2015-02-13 ENCOUNTER — Other Ambulatory Visit: Payer: Self-pay | Admitting: Internal Medicine

## 2015-02-13 DIAGNOSIS — E038 Other specified hypothyroidism: Secondary | ICD-10-CM | POA: Insufficient documentation

## 2015-02-13 DIAGNOSIS — R7989 Other specified abnormal findings of blood chemistry: Secondary | ICD-10-CM

## 2015-02-13 DIAGNOSIS — E039 Hypothyroidism, unspecified: Secondary | ICD-10-CM | POA: Insufficient documentation

## 2015-02-16 ENCOUNTER — Other Ambulatory Visit: Payer: Self-pay | Admitting: Internal Medicine

## 2015-02-16 ENCOUNTER — Other Ambulatory Visit (INDEPENDENT_AMBULATORY_CARE_PROVIDER_SITE_OTHER): Payer: 59

## 2015-02-16 DIAGNOSIS — R946 Abnormal results of thyroid function studies: Secondary | ICD-10-CM | POA: Diagnosis not present

## 2015-02-16 DIAGNOSIS — R7989 Other specified abnormal findings of blood chemistry: Secondary | ICD-10-CM

## 2015-02-16 LAB — TSH: TSH: 5.6 u[IU]/mL — AB (ref 0.35–4.50)

## 2015-02-16 LAB — T4, FREE: FREE T4: 0.66 ng/dL (ref 0.60–1.60)

## 2015-02-16 MED ORDER — LEVOTHYROXINE SODIUM 25 MCG PO TABS: 25.0000 ug | ORAL_TABLET | Freq: Every day | ORAL | Status: DC

## 2015-02-26 ENCOUNTER — Ambulatory Visit (HOSPITAL_BASED_OUTPATIENT_CLINIC_OR_DEPARTMENT_OTHER)
Admission: RE | Admit: 2015-02-26 | Discharge: 2015-02-26 | Disposition: A | Discharge status: Home / Self Care | Payer: 59 | Source: Ambulatory Visit | Attending: Obstetrics and Gynecology | Admitting: Obstetrics and Gynecology

## 2015-02-26 DIAGNOSIS — Z1231 Encounter for screening mammogram for malignant neoplasm of breast: Secondary | ICD-10-CM | POA: Insufficient documentation

## 2015-05-12 ENCOUNTER — Other Ambulatory Visit (INDEPENDENT_AMBULATORY_CARE_PROVIDER_SITE_OTHER): Payer: 59

## 2015-05-12 DIAGNOSIS — R7989 Other specified abnormal findings of blood chemistry: Secondary | ICD-10-CM

## 2015-05-12 DIAGNOSIS — R946 Abnormal results of thyroid function studies: Secondary | ICD-10-CM

## 2015-05-12 LAB — TSH: TSH: 4.79 u[IU]/mL — AB (ref 0.35–4.50)

## 2015-05-13 ENCOUNTER — Telehealth: Payer: Self-pay | Admitting: Emergency Medicine

## 2015-05-13 NOTE — Telephone Encounter (Signed)
Are you okay with Hopp's response.

## 2015-05-13 NOTE — Telephone Encounter (Signed)
Yes, I agree 

## 2015-05-13 NOTE — Telephone Encounter (Signed)
-----   Message from Hendricks Limes, MD sent at 05/13/2015 12:51 PM EST ----- TSH minimally elevated on 25 mcg qd See if Dr Quay Burow agrees with 50 mcg qd EXCEPT 1/2 T,Th & Sat with repeat TSH in 10 weeks with F/U with her then She had had some cardiac rhythm issues in past so I had aimed for high normal TSH.  I am sorry you all are slammed; I hope this helps. I did not want her and Marya Amsler to suddenly have even more work on their plates. Hopp ----- Message -----    From: [facility]    Sent: 05/12/2015   1:13 PM      To: Hendricks Limes, MD

## 2015-05-14 ENCOUNTER — Other Ambulatory Visit: Payer: Self-pay | Admitting: Internal Medicine

## 2015-05-14 MED ORDER — LEVOTHYROXINE SODIUM 50 MCG PO TABS: ORAL_TABLET | ORAL | Status: DC

## 2015-05-14 NOTE — Telephone Encounter (Signed)
Spoke with pt. New RX sent to pharm.

## 2015-05-21 ENCOUNTER — Ambulatory Visit (INDEPENDENT_AMBULATORY_CARE_PROVIDER_SITE_OTHER): Payer: 59 | Admitting: Internal Medicine

## 2015-05-21 ENCOUNTER — Encounter: Payer: Self-pay | Admitting: Internal Medicine

## 2015-05-21 VITALS — BP 128/82 | HR 60 | Temp 98.5°F | Resp 16 | Ht 68.0 in | Wt 181.1 lb

## 2015-05-21 DIAGNOSIS — E039 Hypothyroidism, unspecified: Secondary | ICD-10-CM

## 2015-05-21 NOTE — Progress Notes (Signed)
Pre visit review using our clinic review tool, if applicable. No additional management support is needed unless otherwise documented below in the visit note. 

## 2015-05-21 NOTE — Progress Notes (Signed)
   Subjective:    Patient ID: Emily Anderson, female    DOB: 1961/04/28, 55 y.o.   MRN: [id number]  HPI The patient is a 55 YO female coming in for follow up of her thyroid. Her medicine was changed last week to increase the dose to 50 mcg 4 times per week and 25 mcg the other days. She has not noticed a difference in how she feels. She denies fatigue, heat or cold intolerance. No constipation or diarrhea. Exercises 5-6 days per week.   Review of Systems  Constitutional: Negative for fever, activity change, appetite change, fatigue and unexpected weight change.  HENT: Negative.   Eyes: Negative.   Respiratory: Negative for cough, chest tightness, shortness of breath and wheezing.   Cardiovascular: Negative for chest pain, palpitations and leg swelling.  Gastrointestinal: Negative for nausea, abdominal pain, diarrhea, constipation and abdominal distention.  Musculoskeletal: Negative.   Skin: Negative.   Neurological: Negative.   Psychiatric/Behavioral: Negative.       Objective:   Physical Exam  Constitutional: She appears well-developed and well-nourished.  HENT:  Head: Normocephalic and atraumatic.  Eyes: EOM are normal.  Neck: Normal range of motion. No thyromegaly present.  Cardiovascular: Normal rate and regular rhythm.   Pulmonary/Chest: Effort normal and breath sounds normal. No respiratory distress. She has no wheezes. She has no rales.  Abdominal: Soft. Bowel sounds are normal. She exhibits no distension. There is no tenderness. There is no rebound.  Skin: Skin is warm and dry.   Filed Vitals:   05/21/15 1332  BP: 128/82  Pulse: 60  Temp: 98.5 F (36.9 C)  TempSrc: Oral  Resp: 16  Height: 5\' 8"  (1.727 m)  Weight: 181 lb 1.9 oz (82.155 kg)  SpO2: 97%      Assessment & Plan:

## 2015-05-21 NOTE — Patient Instructions (Signed)
We have put in the labs to check the thyroid. Come sometime the week of Feb 27th or the next week and we will call you back with the results of the blood work.

## 2015-05-21 NOTE — Assessment & Plan Note (Signed)
It is unclear to me if this needed to be treated as her free T4 is in range and she was asymptomatic. She is currently on increased dosing for 1 week of 50 mcg daily 4 days per week and 25 mcg 3 days per week. Not able to recheck labs today as it is too early but orders placed and she was advised to come back in 6 weeks for the labs. Adjust as needed.

## 2015-06-10 ENCOUNTER — Telehealth: Payer: Self-pay | Admitting: Internal Medicine

## 2015-06-10 NOTE — Telephone Encounter (Signed)
Patient Name: Emily Anderson  DOB: June 14, 1960    Initial Comment Caller states she has a dosage change in her thyroid medication - since then she has had heart palpitation - a fluttering, and ringing in her eyes   Nurse Assessment  Nurse: Raphael Gibney, RN, Vanita Ingles Date/Time ([place] Time): 06/10/2015 9:43:34 AM  Confirm and document reason for call. If symptomatic, describe symptoms. You must click the next button to save text entered. ---Caller states she has had change in thyroid medication a couple of weeks. She was increased from 25 mcg daily to 25 mcg 3 days a week to 50 mcg 4 times a week. Has had ringing in ears. Has had palpitations and fluttering at times. Has been happening at night. Has had palpations this am. No pain. Bp 127-130/76.  Has the patient traveled out of the country within the last 30 days? ---Not Applicable  Does the patient have any new or worsening symptoms? ---Yes  Will a triage be completed? ---Yes  Related visit to physician within the last 2 weeks? ---No  Does the PT have any chronic conditions? (i.e. diabetes, asthma, etc.) ---Yes  List chronic conditions. ---hypothyroidism; HTN  Did the patient indicate they were pregnant? ---No  Is this a behavioral health or substance abuse call? ---No     Guidelines    Guideline Title Affirmed Question Affirmed Notes  Heart Rate and Heartbeat Questions [1] Heart beating very rapidly (e.g., > 140 / minute) AND [2] not present now (Exception: during exercise)    Final Disposition User   See Physician within 4 Hours (or PCP triage) Raphael Gibney, RN, Vera    Comments  No appts available at [facility]. She does not want to go to another office or urgent care. Please call pt back regarding heart palpitations.   Referrals  REFERRED TO PCP OFFICE   Disagree/Comply: Comply

## 2015-06-10 NOTE — Telephone Encounter (Signed)
Would recommend that she decrease to 25 mcg daily only and come for labs 6 weeks after that change instead of 6 weeks from our visit.

## 2015-06-10 NOTE — Telephone Encounter (Signed)
Pt informed to have labs done in 6 wks. Pt understands the dosing change to 25 mcg daily. Pt will make an appt to see provider if she is not feeling any better.

## 2015-06-16 ENCOUNTER — Telehealth: Payer: Self-pay | Admitting: Internal Medicine

## 2015-06-16 NOTE — Telephone Encounter (Signed)
Patient is requesting to transfer from [facility] to [facility].  Please advise.

## 2015-06-18 NOTE — Telephone Encounter (Signed)
Fine with me

## 2015-06-19 NOTE — Telephone Encounter (Signed)
Ok with me 

## 2015-06-21 NOTE — Telephone Encounter (Signed)
Got scheduled  °

## 2015-07-19 NOTE — Patient Instructions (Signed)
Your procedure is scheduled on:  Monday, August 02, 2015  Enter through the Main Entrance of [facility] at:  6:00 AM  Pick up the phone at the desk and dial [phone].  Call this number if you have problems the morning of surgery: [phone].  Remember:  Do NOT eat food or drink after:  Midnight Sunday, August 01, 2015  Take these medicines the morning of surgery with a SIP OF WATER: Levothyroxine, Spironolactone  Stop taking Fish oil at this time  Do NOT wear jewelry (body piercing), metal hair clips/bobby pins, make-up, or nail polish. Do NOT wear lotions, powders, or perfumes.  You may wear deodorant. Do NOT shave for 48 hours prior to surgery. Do NOT bring valuables to the hospital. Contacts, dentures, or bridgework may not be worn into surgery.  Leave suitcase in car.  After surgery it may be brought to your room.  For patients admitted to the hospital, checkout time is 11:00 AM the day of discharge.

## 2015-07-22 ENCOUNTER — Encounter (HOSPITAL_COMMUNITY)
Admission: RE | Admit: 2015-07-22 | Discharge: 2015-07-22 | Disposition: A | Discharge status: Home / Self Care | Payer: 59 | Source: Ambulatory Visit | Attending: Obstetrics and Gynecology | Admitting: Obstetrics and Gynecology

## 2015-07-22 ENCOUNTER — Encounter (HOSPITAL_COMMUNITY): Payer: Self-pay

## 2015-07-22 DIAGNOSIS — Z01812 Encounter for preprocedural laboratory examination: Secondary | ICD-10-CM | POA: Diagnosis not present

## 2015-07-22 HISTORY — DX: Hypothyroidism, unspecified: E03.9

## 2015-07-22 HISTORY — DX: Cardiac murmur, unspecified: R01.1

## 2015-07-22 LAB — BASIC METABOLIC PANEL
ANION GAP: 7 (ref 5–15)
BUN: 12 mg/dL (ref 6–20)
CHLORIDE: 104 mmol/L (ref 101–111)
CO2: 26 mmol/L (ref 22–32)
Calcium: 9 mg/dL (ref 8.9–10.3)
Creatinine, Ser: 0.68 mg/dL (ref 0.44–1.00)
GFR calc Af Amer: >60 mL/min (ref >60)
GFR calc non Af Amer: >60 mL/min (ref >60)
GLUCOSE: 95 mg/dL (ref 65–99)
POTASSIUM: 4.2 mmol/L (ref 3.5–5.1)
Sodium: 137 mmol/L (ref 135–145)

## 2015-07-22 LAB — CBC
HEMATOCRIT: 39.5 % (ref 36.0–46.0)
HEMOGLOBIN: 13.5 g/dL (ref 12.0–15.0)
MCH: 32.7 pg (ref 26.0–34.0)
MCHC: 34.2 g/dL (ref 30.0–36.0)
MCV: 95.6 fL (ref 78.0–100.0)
Platelets: 259 10*3/uL (ref 150–400)
RBC: 4.13 MIL/uL (ref 3.87–5.11)
RDW: 12.9 % (ref 11.5–15.5)
WBC: 6.2 10*3/uL (ref 4.0–10.5)

## 2015-07-22 LAB — TYPE AND SCREEN
ABO/RH(D): A NEG
Antibody Screen: NEGATIVE

## 2015-07-22 LAB — ABO/RH: ABO/RH(D): A NEG

## 2015-08-01 MED ORDER — GENTAMICIN SULFATE 40 MG/ML IJ SOLN
INTRAVENOUS | Status: AC
  Administered 2015-08-02: 115 mL via INTRAVENOUS
  Filled 2015-08-01: qty 9

## 2015-08-01 NOTE — H&P (Signed)
Emily Anderson is an 55 y.o. female G2P2  s/p Novasure ablation 7/16 and since that time had about 4 months of only light bleeding and no pain, then started to have more painful bleeding. In December 23rd bled 5 days and then started again two weeks later with increased pain. One month later had heavy BRB. [place] 8.2 1/17. TSH abnormal and she has been adjusting thyroid meds. She recently decreased back down to 30mcg because felt bad on higher dose and has not had repeat BW. US shows probable adenomyosis. She wants definitive surgical therapy. Pap neg/neg 3/15.   Pertinent Gynecological History:  OB History:  C/S x 2   Menstrual History:  No LMP recorded. Patient has had an ablation.    Past Medical History  Diagnosis Date  . Hyperlipidemia   . Hypertension   . Dyspepsia     food triggers  . Heart murmur   . Hypothyroidism     Past Surgical History  Procedure Laterality Date  . Cesarean section      G 2 P 2, both C sections  . Colonoscopy  2009    negative; [facility]  . Ablation      Family History  Problem Relation Age of Onset  . Prostate cancer Father   . Heart disease Father 75    angioplasty   . Colon polyps Father   . Diabetes Father   . Thyroid cancer Mother   . Diabetes Paternal Aunt     1  . Diabetes Paternal Uncle      2  . Stroke Neg Hx   . Colon polyps Sister     Social History:  reports that she has never smoked. She has never used smokeless tobacco. She reports that she does not drink alcohol or use illicit drugs.  Allergies:  Allergies  Allergen Reactions  . Amoxicillin     Rash Because of a history of documented adverse serious drug reaction;Medi Alert bracelet  is recommended Has patient had a PCN reaction causing immediate rash, facial/tongue/throat swelling, SOB or lightheadedness with hypotension: No Has patient had a PCN reaction causing severe rash involving mucus membranes or skin necrosis: No Has patient had a PCN reaction that required  hospitalization No Has patient had a PCN reaction occurring within the last 10 years: No If all of the above answers are "N  . Hctz [Hydrochlorothiazide]     03/2012 K+ 3.3    No prescriptions prior to admission    Review of Systems  Gastrointestinal: Negative for abdominal pain.    There were no vitals taken for this visit. Physical Exam  Constitutional: She appears well-developed.  Cardiovascular: Normal rate.   Respiratory: Effort normal.  GI: Soft.  Genitourinary: Vagina normal and uterus normal.  Neurological: She is alert.  Psychiatric: She has a normal mood and affect.    No results found for this or any previous visit (from the past 24 hour(s)).  No results found.  Assessment/Plan: The patient was counseled regarding the risks of laparoscopic assisted vaginal hysterectomy, The procedure was reviewed in detail and expectations regarding recovery. Risks of bleeding, infection and possible damage to bowel and bladder were reviewed. The patient understands that should a complication arise she would likely need a larger abdominal incision and that this would delay her recovery. She would accept a blood transfusion if needed. We also discussed removal of the fallopian tubes as a means of possibly reducing future risk of ovarian cancer and she is agreeable  to this. She will retain her ovaries unless there is obvious pathology the would warrant removal. She is ready to proceed.  Logan Bores 08/01/2015, 8:05 AM

## 2015-08-02 ENCOUNTER — Ambulatory Visit (HOSPITAL_COMMUNITY): Payer: 59 | Admitting: Anesthesiology

## 2015-08-02 ENCOUNTER — Ambulatory Visit (HOSPITAL_COMMUNITY)
Admission: RE | Admit: 2015-08-02 | Discharge: 2015-08-03 | Disposition: A | Discharge status: Home / Self Care | Payer: 59 | Source: Ambulatory Visit | Attending: Obstetrics and Gynecology | Admitting: Obstetrics and Gynecology

## 2015-08-02 ENCOUNTER — Encounter (HOSPITAL_COMMUNITY)
Admission: RE | Disposition: A | Discharge status: Home / Self Care | Payer: Self-pay | Source: Ambulatory Visit | Attending: Obstetrics and Gynecology

## 2015-08-02 ENCOUNTER — Encounter (HOSPITAL_COMMUNITY): Payer: Self-pay

## 2015-08-02 DIAGNOSIS — R102 Pelvic and perineal pain: Secondary | ICD-10-CM | POA: Insufficient documentation

## 2015-08-02 DIAGNOSIS — E785 Hyperlipidemia, unspecified: Secondary | ICD-10-CM | POA: Diagnosis not present

## 2015-08-02 DIAGNOSIS — E039 Hypothyroidism, unspecified: Secondary | ICD-10-CM | POA: Insufficient documentation

## 2015-08-02 DIAGNOSIS — N838 Other noninflammatory disorders of ovary, fallopian tube and broad ligament: Secondary | ICD-10-CM | POA: Insufficient documentation

## 2015-08-02 DIAGNOSIS — I10 Essential (primary) hypertension: Secondary | ICD-10-CM | POA: Insufficient documentation

## 2015-08-02 DIAGNOSIS — N946 Dysmenorrhea, unspecified: Secondary | ICD-10-CM | POA: Diagnosis not present

## 2015-08-02 DIAGNOSIS — N800 Endometriosis of uterus: Secondary | ICD-10-CM | POA: Diagnosis not present

## 2015-08-02 DIAGNOSIS — Z880 Allergy status to penicillin: Secondary | ICD-10-CM | POA: Diagnosis not present

## 2015-08-02 DIAGNOSIS — Z7982 Long term (current) use of aspirin: Secondary | ICD-10-CM | POA: Diagnosis not present

## 2015-08-02 DIAGNOSIS — Z808 Family history of malignant neoplasm of other organs or systems: Secondary | ICD-10-CM | POA: Insufficient documentation

## 2015-08-02 DIAGNOSIS — N939 Abnormal uterine and vaginal bleeding, unspecified: Secondary | ICD-10-CM | POA: Diagnosis present

## 2015-08-02 DIAGNOSIS — D259 Leiomyoma of uterus, unspecified: Secondary | ICD-10-CM | POA: Diagnosis not present

## 2015-08-02 DIAGNOSIS — R011 Cardiac murmur, unspecified: Secondary | ICD-10-CM | POA: Diagnosis not present

## 2015-08-02 DIAGNOSIS — Z90710 Acquired absence of both cervix and uterus: Secondary | ICD-10-CM | POA: Diagnosis present

## 2015-08-02 HISTORY — PX: LAPAROSCOPIC VAGINAL HYSTERECTOMY WITH SALPINGECTOMY: SHX6680

## 2015-08-02 LAB — PREGNANCY, URINE: Preg Test, Ur: NEGATIVE

## 2015-08-02 SURGERY — HYSTERECTOMY, VAGINAL, LAPAROSCOPY-ASSISTED, WITH SALPINGECTOMY
Anesthesia: General | Laterality: Bilateral

## 2015-08-02 MED ORDER — LACTATED RINGERS IV SOLN
INTRAVENOUS | Status: DC
  Administered 2015-08-02 (×3): via INTRAVENOUS

## 2015-08-02 MED ORDER — SCOPOLAMINE 1 MG/3DAYS TD PT72
1.0000 | MEDICATED_PATCH | Freq: Once | TRANSDERMAL | Status: DC
  Administered 2015-08-02: 1.5 mg via TRANSDERMAL

## 2015-08-02 MED ORDER — PHENYLEPHRINE HCL 10 MG/ML IJ SOLN
INTRAMUSCULAR | Status: DC | PRN
  Administered 2015-08-02 (×2): 40 ug via INTRAVENOUS

## 2015-08-02 MED ORDER — SUGAMMADEX SODIUM 200 MG/2ML IV SOLN
INTRAVENOUS | Status: AC
  Filled 2015-08-02: qty 2

## 2015-08-02 MED ORDER — LIDOCAINE HCL (CARDIAC) 20 MG/ML IV SOLN
INTRAVENOUS | Status: DC | PRN
  Administered 2015-08-02: 80 mg via INTRAVENOUS

## 2015-08-02 MED ORDER — ESTRADIOL 0.1 MG/GM VA CREA
TOPICAL_CREAM | VAGINAL | Status: AC
  Filled 2015-08-02: qty 42.5

## 2015-08-02 MED ORDER — PROMETHAZINE HCL 25 MG/ML IJ SOLN
INTRAMUSCULAR | Status: AC
  Administered 2015-08-02: 6.25 mg via INTRAVENOUS
  Filled 2015-08-02: qty 1

## 2015-08-02 MED ORDER — PROMETHAZINE HCL 25 MG/ML IJ SOLN
6.2500 mg | INTRAMUSCULAR | Status: AC | PRN
  Administered 2015-08-02 (×2): 6.25 mg via INTRAVENOUS

## 2015-08-02 MED ORDER — MAGNESIUM HYDROXIDE 400 MG/5ML PO SUSP: 30.0000 mL | Freq: Every day | ORAL | Status: DC | PRN

## 2015-08-02 MED ORDER — ONDANSETRON HCL 4 MG/2ML IJ SOLN
INTRAMUSCULAR | Status: DC | PRN
  Administered 2015-08-02: 4 mg via INTRAVENOUS

## 2015-08-02 MED ORDER — GLYCOPYRROLATE 0.2 MG/ML IJ SOLN
INTRAMUSCULAR | Status: AC
  Filled 2015-08-02: qty 2

## 2015-08-02 MED ORDER — LIDOCAINE HCL (CARDIAC) 20 MG/ML IV SOLN
INTRAVENOUS | Status: AC
  Filled 2015-08-02: qty 5

## 2015-08-02 MED ORDER — FENTANYL CITRATE (PF) 100 MCG/2ML IJ SOLN
INTRAMUSCULAR | Status: DC | PRN
  Administered 2015-08-02: 100 ug via INTRAVENOUS
  Administered 2015-08-02 (×6): 50 ug via INTRAVENOUS

## 2015-08-02 MED ORDER — KETOROLAC TROMETHAMINE 30 MG/ML IJ SOLN: 30.0000 mg | Freq: Four times a day (QID) | INTRAMUSCULAR | Status: DC

## 2015-08-02 MED ORDER — LACTATED RINGERS IV SOLN
INTRAVENOUS | Status: DC
  Administered 2015-08-02 – 2015-08-03 (×2): via INTRAVENOUS

## 2015-08-02 MED ORDER — SODIUM CHLORIDE 0.9 % IJ SOLN
INTRAMUSCULAR | Status: AC
  Filled 2015-08-02: qty 100

## 2015-08-02 MED ORDER — LACTATED RINGERS IV SOLN: INTRAVENOUS | Status: DC

## 2015-08-02 MED ORDER — LEVOTHYROXINE SODIUM 25 MCG PO TABS
25.0000 ug | ORAL_TABLET | Freq: Every day | ORAL | Status: DC
Start: 2015-08-03 — End: 2015-08-03
  Administered 2015-08-03: 25 ug via ORAL
  Filled 2015-08-02 (×2): qty 1

## 2015-08-02 MED ORDER — KETOROLAC TROMETHAMINE 30 MG/ML IJ SOLN
30.0000 mg | Freq: Four times a day (QID) | INTRAMUSCULAR | Status: DC
  Administered 2015-08-02 – 2015-08-03 (×4): 30 mg via INTRAVENOUS
  Filled 2015-08-02 (×4): qty 1

## 2015-08-02 MED ORDER — HEPARIN SODIUM (PORCINE) 5000 UNIT/ML IJ SOLN
INTRAMUSCULAR | Status: AC
  Filled 2015-08-02: qty 1

## 2015-08-02 MED ORDER — ROCURONIUM BROMIDE 100 MG/10ML IV SOLN
INTRAVENOUS | Status: AC
  Filled 2015-08-02: qty 1

## 2015-08-02 MED ORDER — ONDANSETRON HCL 4 MG PO TABS: 4.0000 mg | ORAL_TABLET | Freq: Four times a day (QID) | ORAL | Status: DC | PRN

## 2015-08-02 MED ORDER — BUPIVACAINE HCL (PF) 0.25 % IJ SOLN
INTRAMUSCULAR | Status: DC | PRN
  Administered 2015-08-02 (×2): 5 mL

## 2015-08-02 MED ORDER — DEXAMETHASONE SODIUM PHOSPHATE 4 MG/ML IJ SOLN
INTRAMUSCULAR | Status: AC
  Filled 2015-08-02: qty 1

## 2015-08-02 MED ORDER — BUPIVACAINE HCL (PF) 0.25 % IJ SOLN
INTRAMUSCULAR | Status: AC
  Filled 2015-08-02: qty 30

## 2015-08-02 MED ORDER — FENTANYL CITRATE (PF) 250 MCG/5ML IJ SOLN
INTRAMUSCULAR | Status: AC
  Filled 2015-08-02: qty 5

## 2015-08-02 MED ORDER — OXYCODONE-ACETAMINOPHEN 5-325 MG PO TABS: 1.0000 | ORAL_TABLET | ORAL | Status: DC | PRN

## 2015-08-02 MED ORDER — VASOPRESSIN 20 UNIT/ML IV SOLN
INTRAVENOUS | Status: AC
  Filled 2015-08-02: qty 1

## 2015-08-02 MED ORDER — SPIRONOLACTONE 25 MG PO TABS
25.0000 mg | ORAL_TABLET | Freq: Every day | ORAL | Status: DC
  Administered 2015-08-03: 25 mg via ORAL
  Filled 2015-08-02 (×2): qty 1

## 2015-08-02 MED ORDER — ONDANSETRON HCL 4 MG/2ML IJ SOLN
INTRAMUSCULAR | Status: AC
  Filled 2015-08-02: qty 2

## 2015-08-02 MED ORDER — ONDANSETRON HCL 4 MG/2ML IJ SOLN: 4.0000 mg | Freq: Four times a day (QID) | INTRAMUSCULAR | Status: DC | PRN

## 2015-08-02 MED ORDER — PROPOFOL 10 MG/ML IV BOLUS
INTRAVENOUS | Status: DC | PRN
  Administered 2015-08-02: 200 mg via INTRAVENOUS

## 2015-08-02 MED ORDER — ROCURONIUM BROMIDE 100 MG/10ML IV SOLN
INTRAVENOUS | Status: DC | PRN
  Administered 2015-08-02: 40 mg via INTRAVENOUS
  Administered 2015-08-02: 10 mg via INTRAVENOUS
  Administered 2015-08-02 (×2): 5 mg via INTRAVENOUS

## 2015-08-02 MED ORDER — SODIUM CHLORIDE 0.9% FLUSH: 9.0000 mL | INTRAVENOUS | Status: DC | PRN

## 2015-08-02 MED ORDER — MIDAZOLAM HCL 2 MG/2ML IJ SOLN
INTRAMUSCULAR | Status: DC | PRN
  Administered 2015-08-02: 2 mg via INTRAVENOUS

## 2015-08-02 MED ORDER — SUGAMMADEX SODIUM 200 MG/2ML IV SOLN
INTRAVENOUS | Status: DC | PRN
  Administered 2015-08-02: 200 mg via INTRAVENOUS

## 2015-08-02 MED ORDER — ONDANSETRON HCL 4 MG/2ML IJ SOLN
4.0000 mg | Freq: Once | INTRAMUSCULAR | Status: AC | PRN
  Administered 2015-08-02: 4 mg via INTRAVENOUS

## 2015-08-02 MED ORDER — MENTHOL 3 MG MT LOZG
1.0000 | LOZENGE | OROMUCOSAL | Status: DC | PRN
  Administered 2015-08-02: 3 mg via ORAL
  Filled 2015-08-02: qty 9

## 2015-08-02 MED ORDER — HYDROMORPHONE HCL 1 MG/ML IJ SOLN
INTRAMUSCULAR | Status: AC
  Filled 2015-08-02: qty 1

## 2015-08-02 MED ORDER — HYDROMORPHONE HCL 1 MG/ML IJ SOLN
INTRAMUSCULAR | Status: DC | PRN
  Administered 2015-08-02: 1 mg via INTRAVENOUS

## 2015-08-02 MED ORDER — DIPHENHYDRAMINE HCL 12.5 MG/5ML PO ELIX: 12.5000 mg | ORAL_SOLUTION | Freq: Four times a day (QID) | ORAL | Status: DC | PRN

## 2015-08-02 MED ORDER — DIPHENHYDRAMINE HCL 50 MG/ML IJ SOLN: 12.5000 mg | Freq: Four times a day (QID) | INTRAMUSCULAR | Status: DC | PRN

## 2015-08-02 MED ORDER — HYDROMORPHONE HCL 1 MG/ML IJ SOLN
0.2500 mg | INTRAMUSCULAR | Status: DC | PRN
  Administered 2015-08-02: 0.5 mg via INTRAVENOUS

## 2015-08-02 MED ORDER — SODIUM CHLORIDE 0.9 % IJ SOLN
INTRAMUSCULAR | Status: AC
  Filled 2015-08-02: qty 10

## 2015-08-02 MED ORDER — NALOXONE HCL 0.4 MG/ML IJ SOLN: 0.4000 mg | INTRAMUSCULAR | Status: DC | PRN

## 2015-08-02 MED ORDER — DEXAMETHASONE SODIUM PHOSPHATE 4 MG/ML IJ SOLN
INTRAMUSCULAR | Status: DC | PRN
  Administered 2015-08-02: 4 mg via INTRAVENOUS

## 2015-08-02 MED ORDER — MIDAZOLAM HCL 2 MG/2ML IJ SOLN
INTRAMUSCULAR | Status: AC
  Filled 2015-08-02: qty 2

## 2015-08-02 MED ORDER — BISACODYL 10 MG RE SUPP: 10.0000 mg | Freq: Every day | RECTAL | Status: DC | PRN

## 2015-08-02 MED ORDER — SIMETHICONE 80 MG PO CHEW: 80.0000 mg | CHEWABLE_TABLET | Freq: Four times a day (QID) | ORAL | Status: DC | PRN

## 2015-08-02 MED ORDER — NEOSTIGMINE METHYLSULFATE 10 MG/10ML IV SOLN
INTRAVENOUS | Status: AC
  Filled 2015-08-02: qty 1

## 2015-08-02 MED ORDER — HYDROMORPHONE 1 MG/ML IV SOLN
INTRAVENOUS | Status: DC
  Administered 2015-08-02: 12:00:00 via INTRAVENOUS
  Administered 2015-08-02: 0.2 mg via INTRAVENOUS
  Administered 2015-08-02: 0.8 mg via INTRAVENOUS
  Filled 2015-08-02: qty 25

## 2015-08-02 MED ORDER — IBUPROFEN 600 MG PO TABS
600.0000 mg | ORAL_TABLET | Freq: Four times a day (QID) | ORAL | Status: DC | PRN
  Administered 2015-08-03: 600 mg via ORAL
  Filled 2015-08-02: qty 1

## 2015-08-02 MED ORDER — LORATADINE 10 MG PO TABS
10.0000 mg | ORAL_TABLET | Freq: Every day | ORAL | Status: DC
  Administered 2015-08-03: 10 mg via ORAL
  Filled 2015-08-02 (×3): qty 1

## 2015-08-02 MED ORDER — PHENYLEPHRINE 40 MCG/ML (10ML) SYRINGE FOR IV PUSH (FOR BLOOD PRESSURE SUPPORT)
PREFILLED_SYRINGE | INTRAVENOUS | Status: AC
  Filled 2015-08-02: qty 10

## 2015-08-02 MED ORDER — SODIUM CHLORIDE 0.9 % IV SOLN
INTRAVENOUS | Status: DC | PRN
  Administered 2015-08-02: 10 mL via INTRAMUSCULAR

## 2015-08-02 MED ORDER — MEPERIDINE HCL 25 MG/ML IJ SOLN: 6.2500 mg | INTRAMUSCULAR | Status: DC | PRN

## 2015-08-02 MED ORDER — SCOPOLAMINE 1 MG/3DAYS TD PT72
MEDICATED_PATCH | TRANSDERMAL | Status: AC
  Administered 2015-08-02: 1.5 mg via TRANSDERMAL
  Filled 2015-08-02: qty 1

## 2015-08-02 MED ORDER — PROPOFOL 10 MG/ML IV BOLUS
INTRAVENOUS | Status: AC
Start: 2015-08-02 — End: 2015-08-02
  Filled 2015-08-02: qty 20

## 2015-08-02 SURGICAL SUPPLY — 42 items
CABLE HIGH FREQUENCY MONO STRZ (ELECTRODE) IMPLANT
CATH ROBINSON RED A/P 16FR (CATHETERS) IMPLANT
CLOTH BEACON ORANGE TIMEOUT ST (SAFETY) ×2 IMPLANT
CONT PATH 16OZ SNAP LID 3702 (MISCELLANEOUS) ×2 IMPLANT
COVER BACK TABLE 60X90IN (DRAPES) ×2 IMPLANT
COVER MAYO STAND STRL (DRAPES) IMPLANT
DECANTER SPIKE VIAL GLASS SM (MISCELLANEOUS) ×8 IMPLANT
DRSG COVADERM PLUS 2X2 (GAUZE/BANDAGES/DRESSINGS) IMPLANT
DRSG OPSITE POSTOP 3X4 (GAUZE/BANDAGES/DRESSINGS) IMPLANT
DURAPREP 26ML APPLICATOR (WOUND CARE) ×2 IMPLANT
ELECT REM PT RETURN 9FT ADLT (ELECTROSURGICAL) ×2
ELECTRODE REM PT RTRN 9FT ADLT (ELECTROSURGICAL) ×1 IMPLANT
FILTER SMOKE EVAC LAPAROSHD (FILTER) IMPLANT
GLOVE BIO SURGEON STRL SZ 6.5 (GLOVE) ×6 IMPLANT
GLOVE BIOGEL PI IND STRL 6.5 (GLOVE) ×1 IMPLANT
GLOVE BIOGEL PI IND STRL 7.0 (GLOVE) ×3 IMPLANT
GLOVE BIOGEL PI INDICATOR 6.5 (GLOVE) ×1
GLOVE BIOGEL PI INDICATOR 7.0 (GLOVE) ×3
GLOVE ORTHO TXT STRL SZ7.5 (GLOVE) ×2 IMPLANT
LEGGING LITHOTOMY PAIR STRL (DRAPES) ×2 IMPLANT
LIQUID BAND (GAUZE/BANDAGES/DRESSINGS) ×2 IMPLANT
NEEDLE INSUFFLATION 120MM (ENDOMECHANICALS) ×2 IMPLANT
NS IRRIG 1000ML POUR BTL (IV SOLUTION) ×4 IMPLANT
PACK LAVH (CUSTOM PROCEDURE TRAY) ×2 IMPLANT
PACK ROBOTIC GOWN (GOWN DISPOSABLE) ×2 IMPLANT
PAD TRENDELENBURG POSITION (MISCELLANEOUS) ×2 IMPLANT
SET IRRIG TUBING LAPAROSCOPIC (IRRIGATION / IRRIGATOR) ×2 IMPLANT
SHEARS HARMONIC ACE PLUS 36CM (ENDOMECHANICALS) ×2 IMPLANT
SLEEVE XCEL OPT CAN 5 100 (ENDOMECHANICALS) ×6 IMPLANT
STRIP CLOSURE SKIN 1/4X3 (GAUZE/BANDAGES/DRESSINGS) IMPLANT
SUT SILK 0 FSL (SUTURE) ×2 IMPLANT
SUT VIC AB 0 CT1 18XCR BRD8 (SUTURE) ×3 IMPLANT
SUT VIC AB 0 CT1 8-18 (SUTURE) ×3
SUT VIC AB 2-0 CT1 (SUTURE) ×2 IMPLANT
SUT VICRYL 0 TIES 12 18 (SUTURE) IMPLANT
SUT VICRYL 0 UR6 27IN ABS (SUTURE) IMPLANT
SUT VICRYL 4-0 PS2 18IN ABS (SUTURE) ×4 IMPLANT
TOWEL OR 17X24 6PK STRL BLUE (TOWEL DISPOSABLE) ×4 IMPLANT
TRAY FOLEY CATH SILVER 14FR (SET/KITS/TRAYS/PACK) ×2 IMPLANT
TROCAR XCEL NON-BLD 5MMX100MML (ENDOMECHANICALS) ×2 IMPLANT
WARMER LAPAROSCOPE (MISCELLANEOUS) ×2 IMPLANT
WATER STERILE IRR 1000ML POUR (IV SOLUTION) IMPLANT

## 2015-08-02 NOTE — Progress Notes (Signed)
Patient ID: Emily Anderson, female   DOB: 17-Jan-1961, 55 y.o.   MRN: [id number] Per pt no changes in dictated H&P.  Brief exam WNL.  Ready to proceed.

## 2015-08-02 NOTE — Anesthesia Procedure Notes (Signed)
Procedure Name: Intubation Date/Time: 08/02/2015 7:30 AM Performed by: Elenore Paddy Pre-anesthesia Checklist: Patient identified, Emergency Drugs available, Suction available and Patient being monitored Patient Re-evaluated:Patient Re-evaluated prior to inductionOxygen Delivery Method: Circle system utilized Preoxygenation: Pre-oxygenation with 100% oxygen Intubation Type: IV induction Ventilation: Oral airway inserted - appropriate to patient size Laryngoscope Size: Mac and 3 Grade View: Grade I Tube type: Oral Number of attempts: 1 Airway Equipment and Method: Stylet Secured at: 21 cm Tube secured with: Tape Dental Injury: Teeth and Oropharynx as per pre-operative assessment

## 2015-08-02 NOTE — Transfer of Care (Signed)
Immediate Anesthesia Transfer of Care Note  Patient: Emily Anderson  Procedure(s) Performed: Procedure(s): LAPAROSCOPIC ASSISTED VAGINAL HYSTERECTOMY WITH SALPINGECTOMY (Bilateral)  Patient Location: PACU  Anesthesia Type:General  Level of Consciousness: awake, alert  and oriented  Airway & Oxygen Therapy: Patient Spontanous Breathing and Patient connected to nasal cannula oxygen  Post-op Assessment: Report given to RN and Post -op Vital signs reviewed and stable  Post vital signs: Reviewed and stable  Last Vitals:  Filed Vitals:   08/02/15 0621  BP: 128/81  Pulse: 64  Temp: 36.6 C  Resp: 18    Complications: No apparent anesthesia complications

## 2015-08-02 NOTE — Anesthesia Postprocedure Evaluation (Signed)
Anesthesia Post Note  Patient: Emily Anderson  Procedure(s) Performed: Procedure(s) (LRB): LAPAROSCOPIC ASSISTED VAGINAL HYSTERECTOMY WITH SALPINGECTOMY (Bilateral)  Patient location during evaluation: PACU Anesthesia Type: General Level of consciousness: awake and alert Pain management: pain level controlled Vital Signs Assessment: post-procedure vital signs reviewed and stable Respiratory status: spontaneous breathing, nonlabored ventilation, respiratory function stable and patient connected to nasal cannula oxygen Cardiovascular status: blood pressure returned to baseline and stable Postop Assessment: no signs of nausea or vomiting Anesthetic complications: no    Last Vitals:  Filed Vitals:   08/02/15 1045 08/02/15 1100  BP: 129/82 112/66  Pulse: 61 61  Temp:    Resp: 12 12    Last Pain:  Filed Vitals:   08/02/15 1103  PainSc: 7                  Zalika Tieszen DAVID

## 2015-08-02 NOTE — Op Note (Signed)
Operative Note    Preoperative Diagnosis Abnormal uterine bleeding failing Novasure Dysmenorrhea Pelvic Pain  Postoperative Diagnosis Same with suspected adenomyosis  Procedure Laparoscopic assisted vaginal hysterectomy with bilateral salpingectomies  Surgeon Danne Harbor Meisinger  Anesthesia GETA  Fluids: EBL 400cc UOP 250cc clear IVF 2200cc LR  Findings The uterus was normal in size, slightly globular in texture.  Ovaries, tubes, appendix and liver were WNL.  There were some possible foci of endometriosis on the anterior surface of the uterus.  Specimen Uterus, tubes and cervix  Procedure Note Patient was taken to the operating room where general anesthesia was obtained without difficulty. She was then prepped and draped in the normal sterile fashion in the dorsal lithotomy position. An appropriate timeout was performed. A speculum was then placed within the vagina and a Hulka tenaculum placed within the cervix for uterine manipulation. A foley catheter was placed in the bladder.  Attention was then turned to the patient's abdomen after draping where the infraumbilical area was injected with approximately 10 cc of quarter percent Marcaine. A 1 cm incision was then made within the umbilicus and the varies needle easily introduced into the peritoneal cavity. Intraperitoneal placement was confirmed by aspiration and injection with normal saline. Gas flow was then applied and a pneumoperitoneum obtained with approximate 3 L of CO2 gas. The varies needle was then removed and a 5 mm optiview trocar was easily introduced into the abdomen under direct visualization. . With patient in Trendelenburg the uterus and tubes and ovaries were inspected with findings as previously stated. Two additional trocars were placed in the upper lateral quadrants under direct visualization after injection with quarter percent marcaine.  THe Harmonic scalpel was then utilized to dissect the  fallopian tubes from the mesosalpinx bilaterally down to the level of the cornua.  The remainder of the uteroovarian ligament and the round ligament were then also taken down with the Harmonic to the level of the bladder flap.  The bladder flap was taken down from the lower uterine segment and pushed away to expose the cervix. All insstruments were then removed from the abdomen which was covered with a sterile drape.  .  Attention was then turned to the vagina. The cervix was grasped with Yates Decamp tenaculums x 2 and injected with a dilute solution of Pitressin circumferentially.  The bovie was then used to make a circumferential incision.  The mayo scissors then further dissected the vaginal mucosa from the underlying cervix and the anterior and posterior cul de sac entered sharply.  With a banana speculum and deaver retractor isolating the uterus from the bladder and rectum.  The uterosacral ligaments and paracervical tissue was taken down sequentially with parametrial clamps and suture ligated with zero vicryl at each step.  When  the was uterus freed on the patient's left, it was then delivered and the remaining tissue on the right clamped and transected completely freeing the uterus and tubes.  It was handed off to pathology.   There were several clots removed from the pelvis which was felt to be secondary to backbeeding from the uterus as all pedicles were hemostatic.   The uterosacral ligaments were approximated with zero vicryl.  The short weighted speculum was placed and the cuff run with a running locked 2-0 vicryl for hemostasis. All instruments were then removed from the vagina.  Gowns and gloves were changed and attention was returned to the abdomen, where pneumoperitoneum was again obtained and all inspected.  The cuff and pedicles were  hemostatic and the ureters visualized and normal in appearance. A four quadrant view of the pelvis and abdomen was performed and found to be normal with no bleeding or  injuries noted.  The instruments were removed from the abdomen as well as the 5 mm lateral ports under visualization.  The pneumoperitoneum was reduced through the trocar. The trocar was finally removed and the infraumbilical incision and lateral incisions were closed with a subcuticular stitch of 3-0 Vicryl.  Liquiband was placed. Patient was then awakened and taken to the recovery room in good condition.

## 2015-08-02 NOTE — Anesthesia Preprocedure Evaluation (Signed)
Anesthesia Evaluation  Patient identified by MRN, date of birth, ID band Patient awake    Reviewed: Allergy & Precautions, NPO status , Patient's Chart, lab work & pertinent test results  Airway Mallampati: I  TM Distance: >3 FB Neck ROM: Full    Dental   Pulmonary    Pulmonary exam normal        Cardiovascular hypertension, Pt. on medications Normal cardiovascular exam     Neuro/Psych    GI/Hepatic   Endo/Other    Renal/GU      Musculoskeletal   Abdominal   Peds  Hematology   Anesthesia Other Findings   Reproductive/Obstetrics                             Anesthesia Physical Anesthesia Plan  ASA: II  Anesthesia Plan: General   Post-op Pain Management:    Induction: Intravenous  Airway Management Planned: Oral ETT  Additional Equipment:   Intra-op Plan:   Post-operative Plan: Extubation in OR  Informed Consent: I have reviewed the patients History and Physical, chart, labs and discussed the procedure including the risks, benefits and alternatives for the proposed anesthesia with the patient or authorized representative who has indicated his/her understanding and acceptance.     Plan Discussed with: CRNA and Surgeon  Anesthesia Plan Comments:         Anesthesia Quick Evaluation  

## 2015-08-03 ENCOUNTER — Encounter (HOSPITAL_COMMUNITY): Payer: Self-pay | Admitting: Obstetrics and Gynecology

## 2015-08-03 DIAGNOSIS — N939 Abnormal uterine and vaginal bleeding, unspecified: Secondary | ICD-10-CM | POA: Diagnosis not present

## 2015-08-03 LAB — BASIC METABOLIC PANEL
Anion gap: 5 (ref 5–15)
BUN: 11 mg/dL (ref 6–20)
CO2: 28 mmol/L (ref 22–32)
CREATININE: 0.7 mg/dL (ref 0.44–1.00)
Calcium: 8.3 mg/dL — ABNORMAL LOW (ref 8.9–10.3)
Chloride: 106 mmol/L (ref 101–111)
GFR calc Af Amer: >60 mL/min (ref >60)
Glucose, Bld: 94 mg/dL (ref 65–99)
Potassium: 3.9 mmol/L (ref 3.5–5.1)
SODIUM: 139 mmol/L (ref 135–145)

## 2015-08-03 LAB — CBC
HCT: 30.8 % — ABNORMAL LOW (ref 36.0–46.0)
Hemoglobin: 10.4 g/dL — ABNORMAL LOW (ref 12.0–15.0)
MCH: 32.3 pg (ref 26.0–34.0)
MCHC: 33.8 g/dL (ref 30.0–36.0)
MCV: 95.7 fL (ref 78.0–100.0)
PLATELETS: 199 10*3/uL (ref 150–400)
RBC: 3.22 MIL/uL — ABNORMAL LOW (ref 3.87–5.11)
RDW: 12.8 % (ref 11.5–15.5)
WBC: 10.7 10*3/uL — ABNORMAL HIGH (ref 4.0–10.5)

## 2015-08-03 MED ORDER — IBUPROFEN 600 MG PO TABS: 600.0000 mg | ORAL_TABLET | Freq: Four times a day (QID) | ORAL | Status: DC | PRN

## 2015-08-03 MED ORDER — OXYCODONE-ACETAMINOPHEN 5-325 MG PO TABS: 1.0000 | ORAL_TABLET | ORAL | Status: DC | PRN

## 2015-08-03 NOTE — Anesthesia Postprocedure Evaluation (Signed)
Anesthesia Post Note  Patient: Emily Anderson  Procedure(s) Performed: Procedure(s) (LRB): LAPAROSCOPIC ASSISTED VAGINAL HYSTERECTOMY WITH SALPINGECTOMY (Bilateral)  Patient location during evaluation: [facility] Level of consciousness: awake, awake and alert, oriented and patient cooperative Pain management: pain level controlled Vital Signs Assessment: post-procedure vital signs reviewed and stable Respiratory status: spontaneous breathing Cardiovascular status: stable Postop Assessment: no signs of nausea or vomiting and adequate PO intake Anesthetic complications: no    Last Vitals:  Filed Vitals:   08/03/15 0530 08/03/15 0539  BP: 104/56   Pulse: 61   Temp: 37 C   Resp: 15 14    Last Pain:  Filed Vitals:   08/03/15 0603  PainSc: 0-No pain                 Kasidy Gianino C

## 2015-08-03 NOTE — Progress Notes (Signed)
Pt discharged to home with husband and daughter.  Condition stable.  Pt ambulated to car with R. Luvenia Heller, SN-[facility].  No equipment for home ordered at discharge.

## 2015-08-03 NOTE — Discharge Summary (Signed)
Physician Discharge Summary  Patient ID: Emily Anderson MRN: [id number] DOB/AGE: 55-Jun-1962 55 y.o.  Admit date: 08/02/2015 Discharge date: 08/03/2015  Admission Diagnoses:  Pelvic pain                                         Dysmenorrhea  Discharge Diagnoses: same  Active Problems:   S/P laparoscopic assisted vaginal hysterectomy (LAVH) Bilateral Salpingectomies  Discharged Condition: good  Hospital Course: Pt admitted to observation following LAVH/Bilateral Salpingectomies and did well.  By post-operative day #1 was ambulating, tolerating regular diet, and taking pain medications by mouth.  Consults: None    Treatments: surgery:  LAVH/Bilateral Salpingectomies   Discharge Exam: Blood pressure 104/56, pulse 61, temperature 98.6 F (37 C), temperature source Oral, resp. rate 14, height 5\' 8"  (1.727 m), weight 82.555 kg (182 lb), SpO2 96 %. General appearance: alert and cooperative GI: soft NT and incisions intact  Disposition: Final discharge disposition not confirmed  Discharge Instructions    Diet - low sodium heart healthy    Complete by:  As directed      Discharge instructions    Complete by:  As directed   Avoid driving for at least 1-2 weeks or until off narcotic pain meds.  No heavy lifting greater than 10 lbs.  Nothing in vagina for 6 weeks.    Shower over incision and pat dry.     Increase activity slowly    Complete by:  As directed             Medication List    STOP taking these medications        aspirin 81 MG tablet      TAKE these medications        CENTRUM SILVER PO  Take 1 tablet by mouth daily.     cetirizine 10 MG tablet  Commonly known as:  ZYRTEC  Take 10 mg by mouth daily.     FISH OIL PO  Take 1,200 mg by mouth daily.     ibuprofen 600 MG tablet  Commonly known as:  ADVIL,MOTRIN  Take 1 tablet (600 mg total) by mouth every 6 (six) hours as needed (mild pain).     levothyroxine 25 MCG tablet  Commonly known as:  SYNTHROID,  LEVOTHROID  Take 25 mcg by mouth daily before breakfast.     oxyCODONE-acetaminophen 5-325 MG tablet  Commonly known as:  PERCOCET/ROXICET  Take 1 tablet by mouth every 3 (three) hours as needed (moderate to severe pain (when tolerating fluids)).     spironolactone 25 MG tablet  Commonly known as:  ALDACTONE  Take 1 tablet (25 mg total) by mouth daily. In place of HCTZ           Follow-up Information    Follow up with Logan Bores, MD. Schedule an appointment as soon as possible for a visit in 2 weeks.   Specialty:  Obstetrics and Gynecology   Why:  incision check   Contact information:   [address] [place] [address] [phone]       Signed: Logan Bores 08/03/2015, 9:31 AM

## 2015-08-03 NOTE — Addendum Note (Signed)
Addendum  created 08/03/15 0803 by Tobin Chad, CRNA   Modules edited: Clinical Notes   Clinical Notes:  File: [id number]

## 2015-08-03 NOTE — Progress Notes (Signed)
Patient ID: Emily Anderson, female   DOB: December 12, 1960, 55 y.o.   MRN: [id number] Pt feeling well this AM.  Some mild nausea yesterday, resolved now Has been up and ambulated with no problems Pain well-controlled and no VB  afeb now, Tmax 99.3 2200 [address] UOP  Abdomen soft NT Incisions well-approximated  Pt doing well s/p LAVH.  Will advance to oral meds and d/c foley Plan d/c home this PM

## 2015-08-23 ENCOUNTER — Other Ambulatory Visit (INDEPENDENT_AMBULATORY_CARE_PROVIDER_SITE_OTHER): Payer: 59

## 2015-08-23 ENCOUNTER — Ambulatory Visit (INDEPENDENT_AMBULATORY_CARE_PROVIDER_SITE_OTHER): Payer: 59 | Admitting: Internal Medicine

## 2015-08-23 ENCOUNTER — Encounter: Payer: Self-pay | Admitting: Internal Medicine

## 2015-08-23 VITALS — BP 120/82 | HR 59 | Temp 97.8°F | Resp 16 | Wt 182.0 lb

## 2015-08-23 DIAGNOSIS — E785 Hyperlipidemia, unspecified: Secondary | ICD-10-CM

## 2015-08-23 DIAGNOSIS — I10 Essential (primary) hypertension: Secondary | ICD-10-CM | POA: Diagnosis not present

## 2015-08-23 DIAGNOSIS — Z1159 Encounter for screening for other viral diseases: Secondary | ICD-10-CM | POA: Diagnosis not present

## 2015-08-23 DIAGNOSIS — E038 Other specified hypothyroidism: Secondary | ICD-10-CM

## 2015-08-23 LAB — COMPREHENSIVE METABOLIC PANEL
ALK PHOS: 70 U/L (ref 39–117)
ALT: 21 U/L (ref 0–35)
AST: 16 U/L (ref 0–37)
Albumin: 4.2 g/dL (ref 3.5–5.2)
BILIRUBIN TOTAL: 0.3 mg/dL (ref 0.2–1.2)
BUN: 16 mg/dL (ref 6–23)
CO2: 25 mEq/L (ref 19–32)
CREATININE: 0.67 mg/dL (ref 0.40–1.20)
Calcium: 9.4 mg/dL (ref 8.4–10.5)
Chloride: 103 mEq/L (ref 96–112)
GFR: 97.36 mL/min (ref >60.00)
Glucose, Bld: 96 mg/dL (ref 70–99)
Potassium: 4.3 mEq/L (ref 3.5–5.1)
Sodium: 137 mEq/L (ref 135–145)
Total Protein: 7.3 g/dL (ref 6.0–8.3)

## 2015-08-23 LAB — T4, FREE: FREE T4: 0.71 ng/dL (ref 0.60–1.60)

## 2015-08-23 LAB — LIPID PANEL
Cholesterol: 247 mg/dL — ABNORMAL HIGH (ref 0–200)
HDL: 47.1 mg/dL (ref >39.00)
NONHDL: 200.26
Total CHOL/HDL Ratio: 5
Triglycerides: 207 mg/dL — ABNORMAL HIGH (ref 0.0–149.0)
VLDL: 41.4 mg/dL — ABNORMAL HIGH (ref 0.0–40.0)

## 2015-08-23 LAB — TSH: TSH: 3.82 u[IU]/mL (ref 0.35–4.50)

## 2015-08-23 LAB — LDL CHOLESTEROL, DIRECT: LDL DIRECT: 171 mg/dL

## 2015-08-23 LAB — HEPATITIS C ANTIBODY: HCV AB: NEGATIVE

## 2015-08-23 MED ORDER — SPIRONOLACTONE 25 MG PO TABS: 25.0000 mg | ORAL_TABLET | Freq: Every day | ORAL | Status: DC

## 2015-08-23 MED ORDER — LEVOTHYROXINE SODIUM 25 MCG PO TABS: 25.0000 ug | ORAL_TABLET | Freq: Every day | ORAL | Status: DC

## 2015-08-23 NOTE — Patient Instructions (Addendum)
  Test(s) ordered today. Your results will be released to MyChart (or called to you) after review, usually within 72hours after test completion. If any changes need to be made, you will be notified at that same time.  All other Health Maintenance issues reviewed.   All recommended immunizations and age-appropriate screenings are up-to-date or discussed.  No immunizations administered today.   Medications reviewed and updated.  No changes recommended at this time.  Your prescription(s) have been submitted to your pharmacy. Please take as directed and contact our office if you believe you are having problem(s) with the medication(s).   Please followup in one year   

## 2015-08-23 NOTE — Progress Notes (Signed)
Subjective:    Patient ID: Emily Anderson, female    DOB: 1961/02/08, 55 y.o.   MRN: [id number]  HPI She is here to establish with a new pcp.  She is here for follow up.  Hypothyroidism:  She is taking her medication daily.  She denies any recent changes in energy or weight that are unexplained. She never noticed a difference being on the medication.   Hypertension: She is taking her medication daily. She is compliant with a low sodium diet.  She denies chest pain, palpitations, edema, shortness of breath. She is exercising regularly.  She does not monitor her blood pressure at home.    Hyperlipidemia:  She stopped taking her simvastatin in the fall because she would prefer not to be on it.  She had side effects with there first statin, but tolerated the simvastatin.  She wanted to try to control her sugars with lifestyle, but has a hard time eating healthy consistently.  She would prefer not to be on medication.  She is exercising regularly.     Medications and allergies reviewed with patient and updated if appropriate.  Patient Active Problem List   Diagnosis Date Noted  . S/P laparoscopic assisted vaginal hysterectomy (LAVH) 08/02/2015  . Hypothyroidism 02/13/2015  . Greater trochanteric bursitis of right hip 05/19/2014  . Essential hypertension 07/19/2009  . Hyperlipidemia 08/01/2007  . DYSPEPSIA 04/04/2007    Current Outpatient Prescriptions on File Prior to Visit  Medication Sig Dispense Refill  . cetirizine (ZYRTEC) 10 MG tablet Take 10 mg by mouth daily.    Marland Kitchen ibuprofen (ADVIL,MOTRIN) 600 MG tablet Take 1 tablet (600 mg total) by mouth every 6 (six) hours as needed (mild pain). 30 tablet 0  . Multiple Vitamins-Minerals (CENTRUM SILVER PO) Take 1 tablet by mouth daily.    . Omega-3 Fatty Acids (FISH OIL PO) Take 1,200 mg by mouth daily.     No current facility-administered medications on file prior to visit.    Past Medical History  Diagnosis Date  . Hyperlipidemia    . Hypertension   . Dyspepsia     food triggers  . Heart murmur   . Hypothyroidism     Past Surgical History  Procedure Laterality Date  . Cesarean section      G 2 P 2, both C sections  . Colonoscopy  2009    negative; [facility]  . Ablation    . Laparoscopic vaginal hysterectomy with salpingectomy Bilateral 08/02/2015    Procedure: LAPAROSCOPIC ASSISTED VAGINAL HYSTERECTOMY WITH SALPINGECTOMY;  Surgeon: Paula Compton, MD;  Location: [facility];  Service: Gynecology;  Laterality: Bilateral;    Social History   Social History  . Marital Status: Married    Spouse Name: N/A  . Number of Children: N/A  . Years of Education: N/A   Occupational History  . MANAGER    Social History Main Topics  . Smoking status: Never Smoker   . Smokeless tobacco: Never Used  . Alcohol Use: No  . Drug Use: No  . Sexual Activity: Yes   Other Topics Concern  . Not on file   Social History Narrative   REG EXERCISE..   LOW CARB LOW NA DIET    Family History  Problem Relation Age of Onset  . Prostate cancer Father   . Heart disease Father 27    angioplasty   . Colon polyps Father   . Diabetes Father   . Thyroid cancer Mother   . Diabetes Paternal  Aunt     1  . Diabetes Paternal Uncle      2  . Stroke Neg Hx   . Colon polyps Sister     Review of Systems  Constitutional: Negative for fever, chills and fatigue.  Respiratory: Negative for cough, shortness of breath and wheezing.   Cardiovascular: Negative for chest pain, palpitations and leg swelling.  Neurological: Positive for light-headedness and headaches (occasional).       Objective:   Filed Vitals:   08/23/15 0803  BP: 120/82  Pulse: 59  Temp: 97.8 F (36.6 C)  Resp: 16   Filed Weights   08/23/15 0803  Weight: 182 lb (82.555 kg)   Body mass index is 27.68 kg/(m^2).   Physical Exam Constitutional: Appears well-developed and well-nourished. No distress.  Neck: Neck supple. No tracheal deviation present. No  thyromegaly present.  No carotid bruit. No cervical adenopathy.   Cardiovascular: Normal rate, regular rhythm and normal heart sounds.   No murmur heard.  No edema Pulmonary/Chest: Effort normal and breath sounds normal. No respiratory distress. No wheezes.       Assessment & Plan:   See Problem List for Assessment and Plan of chronic medical problems.  Follow up annually

## 2015-08-23 NOTE — Assessment & Plan Note (Signed)
Check tsh  Titrate med dose if needed  

## 2015-08-23 NOTE — Assessment & Plan Note (Signed)
BP well controlled Current regimen effective and well tolerated Continue current medications at current doses cmp  

## 2015-08-23 NOTE — Assessment & Plan Note (Signed)
Check lipid panel, cmp Would prefer not to go on medication again - tolerated simvastatin ASCVD risk low with last lipid numbers - may not need statin -- will re-evaluate after blood work today Continue regular exercise and fairly health diet

## 2015-08-23 NOTE — Progress Notes (Signed)
Pre visit review using our clinic review tool, if applicable. No additional management support is needed unless otherwise documented below in the visit note. 

## 2015-08-26 ENCOUNTER — Encounter: Payer: Self-pay | Admitting: Internal Medicine

## 2016-03-16 LAB — HM MAMMOGRAPHY

## 2016-04-03 ENCOUNTER — Ambulatory Visit (INDEPENDENT_AMBULATORY_CARE_PROVIDER_SITE_OTHER): Payer: Commercial Managed Care - HMO | Admitting: Internal Medicine

## 2016-04-03 ENCOUNTER — Encounter: Payer: Self-pay | Admitting: Internal Medicine

## 2016-04-03 VITALS — BP 120/90 | HR 77 | Wt 180.0 lb

## 2016-04-03 DIAGNOSIS — E038 Other specified hypothyroidism: Secondary | ICD-10-CM | POA: Diagnosis not present

## 2016-04-03 DIAGNOSIS — I10 Essential (primary) hypertension: Secondary | ICD-10-CM | POA: Diagnosis not present

## 2016-04-03 DIAGNOSIS — K5900 Constipation, unspecified: Secondary | ICD-10-CM | POA: Insufficient documentation

## 2016-04-03 NOTE — Assessment & Plan Note (Signed)
BP well controlled Current regimen effective and well tolerated Continue current medications at current doses  

## 2016-04-03 NOTE — Progress Notes (Signed)
Subjective:    Patient ID: Emily Anderson, female    DOB: [birth year], 55 y.o.   MRN: [id number]  HPI She is here for an acute visit for constipation.   She had a hysterectomy in April and has had constipation since.  The last two weeks have been slightly better.  She saw her gyn and she did not feel it was from the hysterectomy and thought she should consider further evaluation with a colonoscopy depending on when her last one was.   She is taking metamucil daily.  She still has constipation once every two weeks.  Her whole gut feels "akward".  She feels more pressure trying to use the bathroom, even when urinating.  She has had heavy menses for the past two years and admits she has had discomfort and is more sensitive to her lower abdominal area.   Prior to the hysterectomy she was having normal bowel movements.  She was not taking metamucil prior to the hysterectomy.    She drinks water throughout the day, she is fair with high fiber foods but is taking the metamucil daily and exercises daily.   She had bleeding with a bowel movement a couple of times when she was very constipated and stained. The blood was not in the stool.   Her father was diagnosed with carcinoid tumor since she was here last.   Medications and allergies reviewed with patient and updated if appropriate.  Patient Active Problem List   Diagnosis Date Noted  . S/P laparoscopic assisted vaginal hysterectomy (LAVH) 08/02/2015  . Hypothyroidism 02/13/2015  . Greater trochanteric bursitis of right hip 05/19/2014  . Essential hypertension 07/19/2009  . Hyperlipidemia 08/01/2007  . DYSPEPSIA 04/04/2007    Current Outpatient Prescriptions on File Prior to Visit  Medication Sig Dispense Refill  . cetirizine (ZYRTEC) 10 MG tablet Take 10 mg by mouth daily.    Marland Kitchen ibuprofen (ADVIL,MOTRIN) 600 MG tablet Take 1 tablet (600 mg total) by mouth every 6 (six) hours as needed (mild pain). 30 tablet 0  . levothyroxine  (SYNTHROID, LEVOTHROID) 25 MCG tablet Take 1 tablet (25 mcg total) by mouth daily before breakfast. 90 tablet 3  . Multiple Vitamins-Minerals (CENTRUM SILVER PO) Take 1 tablet by mouth daily.    . Omega-3 Fatty Acids (FISH OIL PO) Take 1,200 mg by mouth daily.    Marland Kitchen spironolactone (ALDACTONE) 25 MG tablet Take 1 tablet (25 mg total) by mouth daily. In place of HCTZ 90 tablet 3   No current facility-administered medications on file prior to visit.     Past Medical History:  Diagnosis Date  . Dyspepsia    food triggers  . Heart murmur   . Hyperlipidemia   . Hypertension   . Hypothyroidism     Past Surgical History:  Procedure Laterality Date  . ABLATION    . CESAREAN SECTION     G 2 P 2, both C sections  . COLONOSCOPY  2009   negative; [facility]  . LAPAROSCOPIC VAGINAL HYSTERECTOMY WITH SALPINGECTOMY Bilateral 08/02/2015   Procedure: LAPAROSCOPIC ASSISTED VAGINAL HYSTERECTOMY WITH SALPINGECTOMY;  Surgeon: Paula Compton, MD;  Location: [facility];  Service: Gynecology;  Laterality: Bilateral;    Social History   Social History  . Marital status: Married    Spouse name: N/A  . Number of children: N/A  . Years of education: N/A   Occupational History  . [employer]   Social History Main Topics  . Smoking status:  Never Smoker  . Smokeless tobacco: Never Used  . Alcohol use No  . Drug use: No  . Sexual activity: Yes   Other Topics Concern  . None   Social History Narrative   REG EXERCISE..   LOW CARB LOW NA DIET    Family History  Problem Relation Age of Onset  . Prostate cancer Father   . Heart disease Father 32    angioplasty   . Colon polyps Father   . Diabetes Father   . Thyroid cancer Mother   . Diabetes Paternal Aunt     1  . Diabetes Paternal Uncle      2  . Stroke Neg Hx   . Colon polyps Sister     Review of Systems  Constitutional: Negative for chills, fatigue and fever.  Gastrointestinal: Positive for anal bleeding  (a couple of times with straining) and constipation. Negative for abdominal pain and blood in stool.       Objective:   Vitals:   04/03/16 0941  BP: 120/90  Pulse: 77   Filed Weights   04/03/16 0941  Weight: 180 lb (81.6 kg)   Body mass index is 27.37 kg/m.   Physical Exam  Constitutional: She appears well-developed and well-nourished. No distress.  Abdominal: Soft. Bowel sounds are normal. She exhibits no distension and no mass. There is tenderness (mild lower abdominal tenderness). There is no rebound and no guarding.  Musculoskeletal: She exhibits no edema.  Skin: Skin is warm and dry. She is not diaphoretic.          Assessment & Plan:   See Problem List for Assessment and Plan of chronic medical problems.

## 2016-04-03 NOTE — Assessment & Plan Note (Signed)
Off of medication tsh checked by gyn and in normal range Monitor off medication

## 2016-04-03 NOTE — Assessment & Plan Note (Signed)
Constipation since partial hysterectomy in April associated with some lower abdominal discomfort which is not necessarily new tsh in normal range (checked by gyn) Father diagnosed with carcinoid tumor since she was here last Gyn recommended possible colonoscopy - she does not feel constipation is related to hysterectomy Taking metamucil - constipation controlled, but still having lower abdominal discomfort and mild constipation Last colonoscopy 2009 - normal Will refer to GI - consider early colonoscopy for new constipation

## 2016-04-03 NOTE — Patient Instructions (Signed)
   No immunizations administered today.   Medications reviewed and updated. No changes recommended at this time.   A referral has been ordered for GI - their office will contact you to schedule the appointment.

## 2016-04-06 ENCOUNTER — Encounter: Payer: Self-pay | Admitting: Internal Medicine

## 2016-07-19 DIAGNOSIS — L814 Other melanin hyperpigmentation: Secondary | ICD-10-CM | POA: Diagnosis not present

## 2016-07-19 DIAGNOSIS — L821 Other seborrheic keratosis: Secondary | ICD-10-CM | POA: Diagnosis not present

## 2016-10-05 ENCOUNTER — Other Ambulatory Visit: Payer: Self-pay | Admitting: Internal Medicine

## 2017-01-26 DIAGNOSIS — Z23 Encounter for immunization: Secondary | ICD-10-CM | POA: Diagnosis not present

## 2017-03-05 DIAGNOSIS — H2513 Age-related nuclear cataract, bilateral: Secondary | ICD-10-CM | POA: Diagnosis not present

## 2017-03-05 DIAGNOSIS — H5213 Myopia, bilateral: Secondary | ICD-10-CM | POA: Diagnosis not present

## 2017-03-05 DIAGNOSIS — H16223 Keratoconjunctivitis sicca, not specified as Sjogren's, bilateral: Secondary | ICD-10-CM | POA: Diagnosis not present

## 2017-03-26 ENCOUNTER — Other Ambulatory Visit: Payer: Self-pay | Admitting: Internal Medicine

## 2017-04-04 DIAGNOSIS — Z01419 Encounter for gynecological examination (general) (routine) without abnormal findings: Secondary | ICD-10-CM | POA: Diagnosis not present

## 2017-04-04 DIAGNOSIS — Z1231 Encounter for screening mammogram for malignant neoplasm of breast: Secondary | ICD-10-CM | POA: Diagnosis not present

## 2017-05-10 NOTE — Assessment & Plan Note (Addendum)
Check lipid panel  Not on medication - did take simvastatin (tolerated it) and took another statin (had muscle aches) Regular exercise and healthy diet encouraged  She will think about going back on medication

## 2017-05-10 NOTE — Progress Notes (Signed)
Subjective:    Patient ID: Emily Anderson, female    DOB: 11-04-1960, 57 y.o.   MRN: [id number]  HPI She is here for a physical exam.   Right hip:  She has had bursitis and did see Dr Tamala Julian in the past and had an injection that helped.  She now has pain in the right hip and left hip.  She also has left buttock pain esp when sitting on a hard surface.   She hs an ache in her right big toe.  It can reach 6-7/10.  She has hand joint pain.  It is all joint related.  Her dad has RA and her sister has muscular dystrophy.  She denies joint swelling.  She has weakness in her hands over several years - it has not gotten worse.    She walks on a treadmill 1-1.5 hr a day.     Medications and allergies reviewed with patient and updated if appropriate.  Patient Active Problem List   Diagnosis Date Noted  . Constipation 04/03/2016  . S/P laparoscopic assisted vaginal hysterectomy (LAVH) 08/02/2015  . Hypothyroidism 02/13/2015  . Greater trochanteric bursitis of right hip 05/19/2014  . Essential hypertension 07/19/2009  . Hyperlipidemia 08/01/2007  . DYSPEPSIA 04/04/2007    Current Outpatient Medications on File Prior to Visit  Medication Sig Dispense Refill  . aspirin EC 81 MG tablet Take 81 mg by mouth daily.    . cetirizine (ZYRTEC) 10 MG tablet Take 10 mg by mouth daily.    . Multiple Vitamins-Minerals (CENTRUM SILVER PO) Take 1 tablet by mouth daily.    . Omega-3 Fatty Acids (FISH OIL PO) Take 1,200 mg by mouth daily.    Marland Kitchen spironolactone (ALDACTONE) 25 MG tablet Take 1 tablet (25 mg total) by mouth once for 1 dose. --- Office visit needed for further refills 30 tablet 0   No current facility-administered medications on file prior to visit.     Past Medical History:  Diagnosis Date  . Dyspepsia    food triggers  . Heart murmur   . Hyperlipidemia   . Hypertension   . Hypothyroidism     Past Surgical History:  Procedure Laterality Date  . ABLATION    . CESAREAN SECTION       G 2 P 2, both C sections  . COLONOSCOPY  2009   negative; [facility]  . LAPAROSCOPIC VAGINAL HYSTERECTOMY WITH SALPINGECTOMY Bilateral 08/02/2015   Procedure: LAPAROSCOPIC ASSISTED VAGINAL HYSTERECTOMY WITH SALPINGECTOMY;  Surgeon: Paula Compton, MD;  Location: [facility];  Service: Gynecology;  Laterality: Bilateral;    Social History   Socioeconomic History  . Marital status: Married    Spouse name: None  . Number of children: None  . Years of education: None  . Highest education level: None  Social Needs  . Financial resource strain: None  . Food insecurity - worry: None  . Food insecurity - inability: None  . Transportation needs - medical: None  . Transportation needs - non-medical: None  Occupational History  . Occupation: Best boy: [employer]  Tobacco Use  . Smoking status: Never Smoker  . Smokeless tobacco: Never Used  Substance and Sexual Activity  . Alcohol use: No  . Drug use: No  . Sexual activity: Yes  Other Topics Concern  . None  Social History Narrative   REG EXERCISE..   LOW CARB LOW NA DIET    Family History  Problem Relation Age of  Onset  . Thyroid cancer Mother   . Prostate cancer Father   . Heart disease Father 39       angioplasty   . Colon polyps Father   . Diabetes Father   . Other Father 37       carcinoid tumor   . Diabetes Paternal Aunt        1  . Diabetes Paternal Uncle         2  . Colon polyps Sister   . Stroke Neg Hx     Review of Systems  Constitutional: Negative for appetite change, chills, fatigue and fever.  Eyes: Negative for visual disturbance.  Respiratory: Negative for cough, shortness of breath and wheezing.   Cardiovascular: Positive for palpitations (stress related). Negative for chest pain and leg swelling.  Gastrointestinal: Positive for constipation (since hysterectomy). Negative for abdominal pain, blood in stool, diarrhea and nausea.       Occ gerd  Genitourinary: Negative  for dysuria and hematuria.  Musculoskeletal: Positive for arthralgias (hands, feet, hips) and back pain (lower back). Negative for joint swelling and myalgias.  Skin: Negative for color change and rash.  Neurological: Negative for light-headedness, numbness and headaches.  Psychiatric/Behavioral: Negative for dysphoric mood. The patient is not nervous/anxious.        Objective:   Vitals:   05/11/17 1340  BP: 120/82  Pulse: 65  Resp: 16  Temp: 98.1 F (36.7 C)  SpO2: 96%   Filed Weights   05/11/17 1340  Weight: 170 lb (77.1 kg)   Body mass index is 25.85 kg/m.  Wt Readings from Last 3 Encounters:  05/11/17 170 lb (77.1 kg)  04/03/16 180 lb (81.6 kg)  08/23/15 182 lb (82.6 kg)     Physical Exam Constitutional: She appears well-developed and well-nourished. No distress.  HENT:  Head: Normocephalic and atraumatic.  Right Ear: External ear normal. Normal ear canal and TM Left Ear: External ear normal.  Normal ear canal and TM Mouth/Throat: Oropharynx is clear and moist.  Eyes: Conjunctivae and EOM are normal.  Neck: Neck supple. No tracheal deviation present. No thyromegaly present.  No carotid bruit  Cardiovascular: Normal rate, regular rhythm and normal heart sounds.   No murmur heard.  No edema. Pulmonary/Chest: Effort normal and breath sounds normal. No respiratory distress. She has no wheezes. She has no rales.  Breast: deferred to Gyn Abdominal: Soft. She exhibits no distension. There is no tenderness.  Lymphadenopathy: She has no cervical adenopathy.  Msk: no joint deformities or joint swelling Skin: Skin is warm and dry. She is not diaphoretic.  Psychiatric: She has a normal mood and affect. Her behavior is normal.        Assessment & Plan:   Physical exam: Screening blood work  ordered Immunizations   Up to date, discussed shingrix Colonoscopy   Up to date  Mammogram   Up to date  Gyn    Up to date  Eye exams   Up to date  EKG  Last done  01/2015 Exercise     regular Weight  Good BMI Skin no concernes Substance abuse  none  See Problem List for Assessment and Plan of chronic medical problems.   FU in one year

## 2017-05-10 NOTE — Patient Instructions (Addendum)
Test(s) ordered today. Your results will be released to [facility] (or called to you) after review, usually within 72hours after test completion. If any changes need to be made, you will be notified at that same time.  All other Health Maintenance issues reviewed.   All recommended immunizations and age-appropriate screenings are up-to-date or discussed.  No immunizations administered today.   Medications reviewed and updated.  No changes recommended at this time.  Your prescription(s) have been submitted to your pharmacy. Please take as directed and contact our office if you believe you are having problem(s) with the medication(s).   Please followup in one year   Health Maintenance, Female Adopting a healthy lifestyle and getting preventive care can go a long way to promote health and wellness. Talk with your health care provider about what schedule of regular examinations is right for you. This is a good chance for you to check in with your provider about disease prevention and staying healthy. In between checkups, there are plenty of things you can do on your own. Experts have done a lot of research about which lifestyle changes and preventive measures are most likely to keep you healthy. Ask your health care provider for more information. Weight and diet Eat a healthy diet  Be sure to include plenty of vegetables, fruits, low-fat dairy products, and lean protein.  Do not eat a lot of foods high in solid fats, added sugars, or salt.  Get regular exercise. This is one of the most important things you can do for your health. ? Most adults should exercise for at least 150 minutes each week. The exercise should increase your heart rate and make you sweat (moderate-intensity exercise). ? Most adults should also do strengthening exercises at least twice a week. This is in addition to the moderate-intensity exercise.  Maintain a healthy weight  Body mass index (BMI) is a measurement that can  be used to identify possible weight problems. It estimates body fat based on height and weight. Your health care provider can help determine your BMI and help you achieve or maintain a healthy weight.  For females 37 years of age and older: ? A BMI below 18.5 is considered underweight. ? A BMI of 18.5 to 24.9 is normal. ? A BMI of 25 to 29.9 is considered overweight. ? A BMI of 30 and above is considered obese.  Watch levels of cholesterol and blood lipids  You should start having your blood tested for lipids and cholesterol at 57 years of age, then have this test every 5 years.  You may need to have your cholesterol levels checked more often if: ? Your lipid or cholesterol levels are high. ? You are older than 57 years of age. ? You are at high risk for heart disease.  Cancer screening Lung Cancer  Lung cancer screening is recommended for adults 67-73 years old who are at high risk for lung cancer because of a history of smoking.  A yearly low-dose CT scan of the lungs is recommended for people who: ? Currently smoke. ? Have quit within the past 15 years. ? Have at least a 30-pack-year history of smoking. A pack year is smoking an average of one pack of cigarettes a day for 1 year.  Yearly screening should continue until it has been 15 years since you quit.  Yearly screening should stop if you develop a health problem that would prevent you from having lung cancer treatment.  Breast Cancer  Practice breast self-awareness.  This means understanding how your breasts normally appear and feel.  It also means doing regular breast self-exams. Let your health care provider know about any changes, no matter how small.  If you are in your 20s or 30s, you should have a clinical breast exam (CBE) by a health care provider every 1-3 years as part of a regular health exam.  If you are 29 or older, have a CBE every year. Also consider having a breast X-ray (mammogram) every year.  If you  have a family history of breast cancer, talk to your health care provider about genetic screening.  If you are at high risk for breast cancer, talk to your health care provider about having an MRI and a mammogram every year.  Breast cancer gene (BRCA) assessment is recommended for women who have family members with BRCA-related cancers. BRCA-related cancers include: ? Breast. ? Ovarian. ? Tubal. ? Peritoneal cancers.  Results of the assessment will determine the need for genetic counseling and BRCA1 and BRCA2 testing.  Cervical Cancer Your health care provider may recommend that you be screened regularly for cancer of the pelvic organs (ovaries, uterus, and vagina). This screening involves a pelvic examination, including checking for microscopic changes to the surface of your cervix (Pap test). You may be encouraged to have this screening done every 3 years, beginning at age 40.  For women ages 44-65, health care providers may recommend pelvic exams and Pap testing every 3 years, or they may recommend the Pap and pelvic exam, combined with testing for human papilloma virus (HPV), every 5 years. Some types of HPV increase your risk of cervical cancer. Testing for HPV may also be done on women of any age with unclear Pap test results.  Other health care providers may not recommend any screening for nonpregnant women who are considered low risk for pelvic cancer and who do not have symptoms. Ask your health care provider if a screening pelvic exam is right for you.  If you have had past treatment for cervical cancer or a condition that could lead to cancer, you need Pap tests and screening for cancer for at least 20 years after your treatment. If Pap tests have been discontinued, your risk factors (such as having a new sexual partner) need to be reassessed to determine if screening should resume. Some women have medical problems that increase the chance of getting cervical cancer. In these cases,  your health care provider may recommend more frequent screening and Pap tests.  Colorectal Cancer  This type of cancer can be detected and often prevented.  Routine colorectal cancer screening usually begins at 57 years of age and continues through 57 years of age.  Your health care provider may recommend screening at an earlier age if you have risk factors for colon cancer.  Your health care provider may also recommend using home test kits to check for hidden blood in the stool.  A small camera at the end of a tube can be used to examine your colon directly (sigmoidoscopy or colonoscopy). This is done to check for the earliest forms of colorectal cancer.  Routine screening usually begins at age 96.  Direct examination of the colon should be repeated every 5-10 years through 57 years of age. However, you may need to be screened more often if early forms of precancerous polyps or small growths are found.  Skin Cancer  Check your skin from head to toe regularly.  Tell your health care provider about any  new moles or changes in moles, especially if there is a change in a mole's shape or color.  Also tell your health care provider if you have a mole that is larger than the size of a pencil eraser.  Always use sunscreen. Apply sunscreen liberally and repeatedly throughout the day.  Protect yourself by wearing long sleeves, pants, a wide-brimmed hat, and sunglasses whenever you are outside.  Heart disease, diabetes, and high blood pressure  High blood pressure causes heart disease and increases the risk of stroke. High blood pressure is more likely to develop in: ? People who have blood pressure in the high end of the normal range (130-139/85-89 mm Hg). ? People who are overweight or obese. ? People who are African American.  If you are 58-1 years of age, have your blood pressure checked every 3-5 years. If you are 52 years of age or older, have your blood pressure checked every year.  You should have your blood pressure measured twice-once when you are at a hospital or clinic, and once when you are not at a hospital or clinic. Record the average of the two measurements. To check your blood pressure when you are not at a hospital or clinic, you can use: ? An automated blood pressure machine at a pharmacy. ? A home blood pressure monitor.  If you are between 24 years and 65 years old, ask your health care provider if you should take aspirin to prevent strokes.  Have regular diabetes screenings. This involves taking a blood sample to check your fasting blood sugar level. ? If you are at a normal weight and have a low risk for diabetes, have this test once every three years after 57 years of age. ? If you are overweight and have a high risk for diabetes, consider being tested at a younger age or more often. Preventing infection Hepatitis B  If you have a higher risk for hepatitis B, you should be screened for this virus. You are considered at high risk for hepatitis B if: ? You were born in a country where hepatitis B is common. Ask your health care provider which countries are considered high risk. ? Your parents were born in a high-risk country, and you have not been immunized against hepatitis B (hepatitis B vaccine). ? You have HIV or AIDS. ? You use needles to inject street drugs. ? You live with someone who has hepatitis B. ? You have had sex with someone who has hepatitis B. ? You get hemodialysis treatment. ? You take certain medicines for conditions, including cancer, organ transplantation, and autoimmune conditions.  Hepatitis C  Blood testing is recommended for: ? Everyone born from [birth year] through 1965. ? Anyone with known risk factors for hepatitis C.  Sexually transmitted infections (STIs)  You should be screened for sexually transmitted infections (STIs) including gonorrhea and chlamydia if: ? You are sexually active and are younger than 57 years of  age. ? You are older than 57 years of age and your health care provider tells you that you are at risk for this type of infection. ? Your sexual activity has changed since you were last screened and you are at an increased risk for chlamydia or gonorrhea. Ask your health care provider if you are at risk.  If you do not have HIV, but are at risk, it may be recommended that you take a prescription medicine daily to prevent HIV infection. This is called pre-exposure prophylaxis (PrEP). You are considered at  risk if: ? You are sexually active and do not regularly use condoms or know the HIV status of your partner(s). ? You take drugs by injection. ? You are sexually active with a partner who has HIV.  Talk with your health care provider about whether you are at high risk of being infected with HIV. If you choose to begin PrEP, you should first be tested for HIV. You should then be tested every 3 months for as long as you are taking PrEP. Pregnancy  If you are premenopausal and you may become pregnant, ask your health care provider about preconception counseling.  If you may become pregnant, take 400 to 800 micrograms (mcg) of folic acid every day.  If you want to prevent pregnancy, talk to your health care provider about birth control (contraception). Osteoporosis and menopause  Osteoporosis is a disease in which the bones lose minerals and strength with aging. This can result in serious bone fractures. Your risk for osteoporosis can be identified using a bone density scan.  If you are 6 years of age or older, or if you are at risk for osteoporosis and fractures, ask your health care provider if you should be screened.  Ask your health care provider whether you should take a calcium or vitamin D supplement to lower your risk for osteoporosis.  Menopause may have certain physical symptoms and risks.  Hormone replacement therapy may reduce some of these symptoms and risks. Talk to your health  care provider about whether hormone replacement therapy is right for you. Follow these instructions at home:  Schedule regular health, dental, and eye exams.  Stay current with your immunizations.  Do not use any tobacco products including cigarettes, chewing tobacco, or electronic cigarettes.  If you are pregnant, do not drink alcohol.  If you are breastfeeding, limit how much and how often you drink alcohol.  Limit alcohol intake to no more than 1 drink per day for nonpregnant women. One drink equals 12 ounces of beer, 5 ounces of wine, or 1 ounces of hard liquor.  Do not use street drugs.  Do not share needles.  Ask your health care provider for help if you need support or information about quitting drugs.  Tell your health care provider if you often feel depressed.  Tell your health care provider if you have ever been abused or do not feel safe at home. This information is not intended to replace advice given to you by your health care provider. Make sure you discuss any questions you have with your health care provider. Document Released: 10/31/2010 Document Revised: 09/23/2015 Document Reviewed: 01/19/2015 Elsevier Interactive Patient Education  [employer].

## 2017-05-11 ENCOUNTER — Telehealth: Payer: Self-pay | Admitting: Internal Medicine

## 2017-05-11 ENCOUNTER — Encounter: Payer: Self-pay | Admitting: Internal Medicine

## 2017-05-11 ENCOUNTER — Ambulatory Visit (INDEPENDENT_AMBULATORY_CARE_PROVIDER_SITE_OTHER): Payer: Commercial Managed Care - HMO | Admitting: Internal Medicine

## 2017-05-11 VITALS — BP 120/82 | HR 65 | Temp 98.1°F | Resp 16 | Wt 170.0 lb

## 2017-05-11 DIAGNOSIS — I10 Essential (primary) hypertension: Secondary | ICD-10-CM

## 2017-05-11 DIAGNOSIS — M25552 Pain in left hip: Secondary | ICD-10-CM

## 2017-05-11 DIAGNOSIS — M2550 Pain in unspecified joint: Secondary | ICD-10-CM | POA: Diagnosis not present

## 2017-05-11 DIAGNOSIS — E038 Other specified hypothyroidism: Secondary | ICD-10-CM

## 2017-05-11 DIAGNOSIS — Z820 Family history of epilepsy and other diseases of the nervous system: Secondary | ICD-10-CM | POA: Diagnosis not present

## 2017-05-11 DIAGNOSIS — M25551 Pain in right hip: Secondary | ICD-10-CM | POA: Diagnosis not present

## 2017-05-11 DIAGNOSIS — Z0001 Encounter for general adult medical examination with abnormal findings: Secondary | ICD-10-CM | POA: Diagnosis not present

## 2017-05-11 DIAGNOSIS — Z0000 Encounter for general adult medical examination without abnormal findings: Secondary | ICD-10-CM

## 2017-05-11 DIAGNOSIS — E7849 Other hyperlipidemia: Secondary | ICD-10-CM | POA: Diagnosis not present

## 2017-05-11 MED ORDER — SPIRONOLACTONE 25 MG PO TABS: 25.0000 mg | ORAL_TABLET | Freq: Once | ORAL | 3 refills | Status: DC

## 2017-05-11 NOTE — Assessment & Plan Note (Signed)
?   Symptoms she is having now early symptoms of muscular dystrophy Will check muscle enzymes May need to see Dr Posey Pronto at neuro for further eval, which is who her sister sees

## 2017-05-11 NOTE — Assessment & Plan Note (Signed)
BP well controlled Current regimen effective and well tolerated Continue current medications at current doses cmp  

## 2017-05-11 NOTE — Telephone Encounter (Signed)
Copied from [place] [id number]. Topic: Quick Communication - Patient Running Late >> May 11, 2017  1:18 PM Cleaster Corin, [place] wrote: Patient called and is running 10 minutes late. Pt. In traffic on [address].    Route to department's P[facility] pool.

## 2017-05-11 NOTE — Assessment & Plan Note (Addendum)
H/o right sided bursitis - has seen dr Tamala Julian in the  Past Now with b/l hip pain ? Bursitis or related to back issues Sister with muscular dystrophy and she presented with similar symptoms  Blood work today - may need to see Dr Posey Pronto

## 2017-05-11 NOTE — Assessment & Plan Note (Signed)
Check tsh Not on medication Last tsh in normal range

## 2017-05-11 NOTE — Assessment & Plan Note (Signed)
May be OA or RA Dad has RA - will rule that out

## 2017-05-24 ENCOUNTER — Other Ambulatory Visit (INDEPENDENT_AMBULATORY_CARE_PROVIDER_SITE_OTHER): Payer: Commercial Managed Care - HMO

## 2017-05-24 DIAGNOSIS — E038 Other specified hypothyroidism: Secondary | ICD-10-CM | POA: Diagnosis not present

## 2017-05-24 DIAGNOSIS — M25552 Pain in left hip: Secondary | ICD-10-CM

## 2017-05-24 DIAGNOSIS — Z820 Family history of epilepsy and other diseases of the nervous system: Secondary | ICD-10-CM

## 2017-05-24 DIAGNOSIS — M2550 Pain in unspecified joint: Secondary | ICD-10-CM | POA: Diagnosis not present

## 2017-05-24 DIAGNOSIS — M25551 Pain in right hip: Secondary | ICD-10-CM | POA: Diagnosis not present

## 2017-05-24 DIAGNOSIS — E7849 Other hyperlipidemia: Secondary | ICD-10-CM

## 2017-05-24 DIAGNOSIS — I10 Essential (primary) hypertension: Secondary | ICD-10-CM

## 2017-05-24 LAB — CBC WITH DIFFERENTIAL/PLATELET
BASOS PCT: 1.1 % (ref 0.0–3.0)
Basophils Absolute: 0.1 10*3/uL (ref 0.0–0.1)
EOS ABS: 0.3 10*3/uL (ref 0.0–0.7)
EOS PCT: 5.5 % — AB (ref 0.0–5.0)
HEMATOCRIT: 40.8 % (ref 36.0–46.0)
HEMOGLOBIN: 13.9 g/dL (ref 12.0–15.0)
LYMPHS PCT: 35.9 % (ref 12.0–46.0)
Lymphs Abs: 2 10*3/uL (ref 0.7–4.0)
MCHC: 34.1 g/dL (ref 30.0–36.0)
MCV: 95.6 fl (ref 78.0–100.0)
MONOS PCT: 8.1 % (ref 3.0–12.0)
Monocytes Absolute: 0.5 10*3/uL (ref 0.1–1.0)
NEUTROS ABS: 2.8 10*3/uL (ref 1.4–7.7)
Neutrophils Relative %: 49.4 % (ref 43.0–77.0)
PLATELETS: 249 10*3/uL (ref 150.0–400.0)
RBC: 4.27 Mil/uL (ref 3.87–5.11)
RDW: 12.6 % (ref 11.5–15.5)
WBC: 5.7 10*3/uL (ref 4.0–10.5)

## 2017-05-24 LAB — COMPREHENSIVE METABOLIC PANEL
ALBUMIN: 4.2 g/dL (ref 3.5–5.2)
ALT: 17 U/L (ref 0–35)
AST: 16 U/L (ref 0–37)
Alkaline Phosphatase: 53 U/L (ref 39–117)
BUN: 15 mg/dL (ref 6–23)
CALCIUM: 9.4 mg/dL (ref 8.4–10.5)
CHLORIDE: 101 meq/L (ref 96–112)
CO2: 30 meq/L (ref 19–32)
CREATININE: 0.71 mg/dL (ref 0.40–1.20)
GFR: 90.48 mL/min (ref >60.00)
Glucose, Bld: 93 mg/dL (ref 70–99)
POTASSIUM: 4.3 meq/L (ref 3.5–5.1)
SODIUM: 137 meq/L (ref 135–145)
Total Bilirubin: 0.4 mg/dL (ref 0.2–1.2)
Total Protein: 7.2 g/dL (ref 6.0–8.3)

## 2017-05-24 LAB — LIPID PANEL
CHOL/HDL RATIO: 4
Cholesterol: 236 mg/dL — ABNORMAL HIGH (ref 0–200)
HDL: 64.7 mg/dL (ref >39.00)
LDL CALC: 150 mg/dL — AB (ref 0–99)
NonHDL: 171.01
TRIGLYCERIDES: 106 mg/dL (ref 0.0–149.0)
VLDL: 21.2 mg/dL (ref 0.0–40.0)

## 2017-05-24 LAB — SEDIMENTATION RATE: SED RATE: 8 mm/h (ref 0–30)

## 2017-05-24 LAB — CK: Total CK: 44 U/L (ref 7–177)

## 2017-05-24 LAB — TSH: TSH: 6.32 u[IU]/mL — ABNORMAL HIGH (ref 0.35–4.50)

## 2017-05-24 LAB — C-REACTIVE PROTEIN: CRP: 0.1 mg/dL — ABNORMAL LOW (ref 0.5–20.0)

## 2017-05-27 NOTE — Progress Notes (Signed)
[name] [address], [place] [address] Phone: [phone] Subjective:    I'm seeing this patient by the request  of:  Binnie Rail, MD   CC: Bilateral hip pain  [id number]  Emily Anderson is a 57 y.o. female coming in with complaint of bilateral hip pain.  Seen greater than 3 years ago for more of a greater trochanteric bursitis.  Patient states that she has been having bilateral hip pain with the right greater than the left. One the left side she is having pain in the glute that radiates down into the leg. She notices this when she is sitting in her vehicle. She notes that her right hip can catch when she goes form a seated to standing position. She did have an injection in the right hip a couple years ago which help. She said that her sister was recently diagnosed with adult muscular dystrophy and is concerned. Patient walks on the treadmill twice a day for 30 minutes.        Past Medical History:  Diagnosis Date  . Dyspepsia    food triggers  . Heart murmur   . Hyperlipidemia   . Hypertension   . Hypothyroidism    Past Surgical History:  Procedure Laterality Date  . ABLATION    . CESAREAN SECTION     G 2 P 2, both C sections  . COLONOSCOPY  2009   negative; [facility]  . LAPAROSCOPIC VAGINAL HYSTERECTOMY WITH SALPINGECTOMY Bilateral 08/02/2015   Procedure: LAPAROSCOPIC ASSISTED VAGINAL HYSTERECTOMY WITH SALPINGECTOMY;  Surgeon: Paula Compton, MD;  Location: [facility];  Service: Gynecology;  Laterality: Bilateral;   Social History   Socioeconomic History  . Marital status: Married    Spouse name: Not on file  . Number of children: Not on file  . Years of education: Not on file  . Highest education level: Not on file  Social Needs  . Financial resource strain: Not on file  . Food insecurity - worry: Not on file  . Food insecurity - inability: Not on file  . Transportation needs - medical: Not on file  . Transportation  needs - non-medical: Not on file  Occupational History  . Occupation: Best boy: [employer]  Tobacco Use  . Smoking status: Never Smoker  . Smokeless tobacco: Never Used  Substance and Sexual Activity  . Alcohol use: No  . Drug use: No  . Sexual activity: Yes  Other Topics Concern  . Not on file  Social History Narrative   REG EXERCISE..   LOW CARB LOW NA DIET   Allergies  Allergen Reactions  . Amoxicillin Rash    Rash Because of a history of documented adverse serious drug reaction;Medi Alert braceletis recommended Has patient had a PCN reaction causing immediate rash, facial/tongue/throat swelling, SOB or lightheadedness with hypotension: No Has patient had a PCN reaction causing severe rash involving mucus membranes or skin necrosis: No Has patient had a PCN reaction that required hospitalization No Has patient had a PCN reaction occurring within the last 10 years: No If all of the above answers are "N Rash Because of a history of documented adverse serious drug reaction;Medi Alert bracelet  is recommended Has patient had a PCN reaction causing immediate rash, facial/tongue/throat swelling, SOB or lightheadedness with hypotension: No Has patient had a PCN reaction causing severe rash involving mucus membranes or skin necrosis: No Has patient had a PCN reaction that  required hospitalization No Has patient had a PCN reaction occurring within the last 10 years: No If all of the above answers are "N  . Hctz [Hydrochlorothiazide] Other (See Comments)    03/2012 K+ 3.3 03/2012 K+ 3.3  . Sulfamethoxazole-Trimethoprim Rash    H/a's, heart racing & flu symptoms   Family History  Problem Relation Age of Onset  . Thyroid cancer Mother   . Prostate cancer Father   . Heart disease Father 33       angioplasty   . Colon polyps Father   . Diabetes Father   . Other Father 29       carcinoid tumor   . Diabetes Paternal Aunt        1  . Diabetes  Paternal Uncle         2  . Colon polyps Sister   . Muscular dystrophy Sister   . Stroke Neg Hx      Past medical history, social, surgical and family history all reviewed in electronic medical record.  No pertanent information unless stated regarding to the chief complaint.   Review of Systems:Review of systems updated and as accurate as of 05/27/17  No headache, visual changes, nausea, vomiting, diarrhea, constipation, dizziness, abdominal pain, skin rash, fevers, chills, night sweats, weight loss, swollen lymph nodes, body aches, joint swelling, muscle aches, chest pain, shortness of breath, mood changes.   Objective  Last menstrual period 07/19/2015. Systems examined below as of 05/27/17   General: No apparent distress alert and oriented x3 mood and affect normal, dressed appropriately.  HEENT: Pupils equal, extraocular movements intact  Respiratory: Patient's speak in full sentences and does not appear short of breath  Cardiovascular: No lower extremity edema, non tender, no erythema  Skin: Warm dry intact with no signs of infection or rash on extremities or on axial skeleton.  Abdomen: Soft nontender  Neuro: Cranial nerves II through XII are intact, neurovascularly intact in all extremities with 2+ DTRs and 2+ pulses.  Lymph: No lymphadenopathy of posterior or anterior cervical chain or axillae bilaterally.  Gait normal with good balance and coordination.  MSK:  Non tender with full range of motion and good stability and symmetric strength and tone of shoulders, elbows, wrist, , knee and ankles bilaterally.  Bilateral hip exam shows the patient does have near full range of motion with full strength bilaterally.  Positive Faber test on the right and mild on the left.  Severe tenderness over the greater trochanteric area on the right and mild on the left.  More discomfort over the piriformis on the left side.   Procedure: Real-time Ultrasound Guided Injection of right greater  trochanteric bursitis secondary to patient's body habitus Device: GE Logiq Q7 Ultrasound guided injection is preferred based studies that show increased duration, increased effect, greater accuracy, decreased procedural pain, increased response rate, and decreased cost with ultrasound guided versus blind injection.  Verbal informed consent obtained.  Time-out conducted.  Noted no overlying erythema, induration, or other signs of local infection.  Skin prepped in a sterile fashion.  Local anesthesia: Topical Ethyl chloride.  With sterile technique and under real time ultrasound guidance:  Greater trochanteric area was visualized and patient's bursa was noted. A 22-gauge 3 inch needle was inserted and 4 cc of 0.5% Marcaine and 1 cc of Kenalog 40 mg/dL was injected. Pictures taken Completed without difficulty  Pain immediately resolved suggesting accurate placement of the medication.  Advised to call if fevers/chills, erythema, induration, drainage, or persistent  bleeding.  Images permanently stored and available for review in the ultrasound unit.  Impression: Technically successful ultrasound guided injection.   Procedure: Real-time Ultrasound Guided Injection of left  greater trochanteric bursitis secondary to patient's body habitus Device: GE Logiq Q7  Ultrasound guided injection is preferred based studies that show increased duration, increased effect, greater accuracy, decreased procedural pain, increased response rate, and decreased cost with ultrasound guided versus blind injection.  Verbal informed consent obtained.  Time-out conducted.  Noted no overlying erythema, induration, or other signs of local infection.  Skin prepped in a sterile fashion.  Local anesthesia: Topical Ethyl chloride.  With sterile technique and under real time ultrasound guidance:  Greater trochanteric area was visualized and patient's bursa was noted. A 22-gauge 3 inch needle was inserted and 4 cc of 0.5% Marcaine  and 1 cc of Kenalog 40 mg/dL was injected. Pictures taken Completed without difficulty  Pain immediately resolved suggesting accurate placement of the medication.  Advised to call if fevers/chills, erythema, induration, drainage, or persistent bleeding.  Images permanently stored and available for review in the ultrasound unit.  Impression: Technically successful ultrasound guided injection.    Impression and Recommendations:     This case required medical decision making of moderate complexity.      Note: This dictation was prepared with Dragon dictation along with smaller phrase technology. Any transcriptional errors that result from this process are unintentional.

## 2017-05-28 ENCOUNTER — Ambulatory Visit: Payer: Self-pay

## 2017-05-28 ENCOUNTER — Ambulatory Visit: Payer: 59 | Admitting: Family Medicine

## 2017-05-28 ENCOUNTER — Ambulatory Visit (INDEPENDENT_AMBULATORY_CARE_PROVIDER_SITE_OTHER)
Admission: RE | Admit: 2017-05-28 | Discharge: 2017-05-28 | Disposition: A | Discharge status: Home / Self Care | Payer: 59 | Source: Ambulatory Visit | Attending: Family Medicine | Admitting: Family Medicine

## 2017-05-28 ENCOUNTER — Encounter: Payer: Self-pay | Admitting: Family Medicine

## 2017-05-28 VITALS — BP 122/82 | HR 73 | Ht 68.0 in | Wt 168.0 lb

## 2017-05-28 DIAGNOSIS — M25552 Pain in left hip: Secondary | ICD-10-CM

## 2017-05-28 DIAGNOSIS — M7061 Trochanteric bursitis, right hip: Secondary | ICD-10-CM | POA: Diagnosis not present

## 2017-05-28 DIAGNOSIS — M25551 Pain in right hip: Secondary | ICD-10-CM

## 2017-05-28 DIAGNOSIS — M7062 Trochanteric bursitis, left hip: Secondary | ICD-10-CM | POA: Diagnosis not present

## 2017-05-28 DIAGNOSIS — M48061 Spinal stenosis, lumbar region without neurogenic claudication: Secondary | ICD-10-CM | POA: Diagnosis not present

## 2017-05-28 LAB — ANA: ANA: NEGATIVE

## 2017-05-28 LAB — LACTATE DEHYDROGENASE: LDH: 131 U/L (ref 120–250)

## 2017-05-28 LAB — ALDOLASE: ALDOLASE: 2.4 U/L (ref <=8.1)

## 2017-05-28 LAB — CYCLIC CITRUL PEPTIDE ANTIBODY, IGG: Cyclic Citrullin Peptide Ab: <16 UNITS

## 2017-05-28 LAB — RHEUMATOID FACTOR: Rhuematoid fact SerPl-aCnc: <14 IU/mL (ref <14)

## 2017-05-28 MED ORDER — GABAPENTIN 100 MG PO CAPS: 200.0000 mg | ORAL_CAPSULE | Freq: Every day | ORAL | 3 refills | Status: DC

## 2017-05-28 NOTE — Patient Instructions (Addendum)
Good to see you  Emily Anderson is your friend. Ice 20 minutes 2 times daily. Usually after activity and before bed. Exercises 3 times a week.  Tennis ball in back left pocket with sitting long periods of time  pennsaid pinkie amount topically 2 times daily as needed.  Gabapentin 200mg  at night Xray of back today as well downstairs Sorry for running late today

## 2017-05-28 NOTE — Assessment & Plan Note (Signed)
Bilateral injections given.  Ice given.  Differential includes a lumbar radiculopathy and get x-rays.  Gabapentin given.  Patient given home exercises.  Little bit of a piriformis syndrome as well.  Follow-up again in 4 weeks

## 2017-05-29 ENCOUNTER — Encounter: Payer: Self-pay | Admitting: Internal Medicine

## 2017-05-29 DIAGNOSIS — E038 Other specified hypothyroidism: Secondary | ICD-10-CM

## 2017-06-20 ENCOUNTER — Encounter: Payer: Self-pay | Admitting: Gastroenterology

## 2017-06-26 ENCOUNTER — Encounter: Payer: Self-pay | Admitting: Gastroenterology

## 2017-06-27 NOTE — Progress Notes (Signed)
[name] [place],  [address] Phone: [phone] Subjective:     CC: Bilateral hip pain  [id number]  Emily Anderson is a 57 y.o. female coming in with complaint of bilateral hip pain.  Patient was found to have potential greater trochanteric bursitis and given injections bilaterally.  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  Also started on gabapentin.  Patient was having also pain that seem to be more piriformis related.  Possible weakness with adult onset muscular dystrophy.  Found 1 month ago though to have a elevated TSH at 6.32.  Patient states that she has had some improvement since last visit. Her left hip is feeling better but her right hip is still bothering her.      Past Medical History:  Diagnosis Date  . Dyspepsia    food triggers  . Heart murmur   . Hyperlipidemia   . Hypertension   . Hypothyroidism    Past Surgical History:  Procedure Laterality Date  . ABLATION    . CESAREAN SECTION     G 2 P 2, both C sections  . COLONOSCOPY  2009   negative; [facility]  . LAPAROSCOPIC VAGINAL HYSTERECTOMY WITH SALPINGECTOMY Bilateral 08/02/2015   Procedure: LAPAROSCOPIC ASSISTED VAGINAL HYSTERECTOMY WITH SALPINGECTOMY;  Surgeon: Paula Compton, MD;  Location: [facility];  Service: Gynecology;  Laterality: Bilateral;   Social History   Socioeconomic History  . Marital status: Married    Spouse name: Not on file  . Number of children: Not on file  . Years of education: Not on file  . Highest education level: Not on file  Social Needs  . Financial resource strain: Not on file  . Food insecurity - worry: Not on file  . Food insecurity - inability: Not on file  . Transportation needs - medical: Not on file  . Transportation needs - non-medical: Not on file  Occupational History  . Occupation: Best boy: [employer]  Tobacco Use  . Smoking  status: Never Smoker  . Smokeless tobacco: Never Used  Substance and Sexual Activity  . Alcohol use: No  . Drug use: No  . Sexual activity: Yes  Other Topics Concern  . Not on file  Social History Narrative   REG EXERCISE..   LOW CARB LOW NA DIET   Allergies  Allergen Reactions  . Amoxicillin Rash    Rash Because of a history of documented adverse serious drug reaction;Medi Alert braceletis recommended Has patient had a PCN reaction causing immediate rash, facial/tongue/throat swelling, SOB or lightheadedness with hypotension: No Has patient had a PCN reaction causing severe rash involving mucus membranes or skin necrosis: No Has patient had a PCN reaction that required hospitalization No Has patient had a PCN reaction occurring within the last 10 years: No If all of the above answers are "N Rash Because of a history of documented adverse serious drug reaction;Medi Alert bracelet  is recommended Has patient had a PCN reaction causing immediate rash, facial/tongue/throat swelling, SOB or lightheadedness with hypotension: No Has patient had a PCN reaction causing severe rash involving mucus membranes or skin necrosis: No Has patient had a PCN reaction that required hospitalization No Has patient had a PCN reaction occurring within the last 10 years: No If all of the above answers are "N  . Hctz [Hydrochlorothiazide] Other (See Comments)    03/2012 K+ 3.3 03/2012 K+ 3.3  .  Sulfamethoxazole-Trimethoprim Rash    H/a's, heart racing & flu symptoms   Family History  Problem Relation Age of Onset  . Thyroid cancer Mother   . Prostate cancer Father   . Heart disease Father 79       angioplasty   . Colon polyps Father   . Diabetes Father   . Other Father 31       carcinoid tumor   . Diabetes Paternal Aunt        1  . Diabetes Paternal Uncle         2  . Colon polyps Sister   . Muscular dystrophy Sister   . Stroke Neg Hx      Past medical history, social, surgical and  family history all reviewed in electronic medical record.  No pertanent information unless stated regarding to the chief complaint.   Review of Systems:Review of systems updated and as accurate as of 06/29/17  No headache, visual changes, nausea, vomiting, diarrhea, constipation, dizziness, abdominal pain, skin rash, fevers, chills, night sweats, weight loss, swollen lymph nodes, body aches, joint swelling, chest pain, shortness of breath, mood changes.  Positive muscle aches  Objective  Blood pressure 104/70, pulse 63, weight 170 lb (77.1 kg), last menstrual period 07/19/2015, SpO2 95 %. Systems examined below as of 06/29/17   General: No apparent distress alert and oriented x3 mood and affect normal, dressed appropriately.  HEENT: Pupils equal, extraocular movements intact  Respiratory: Patient's speak in full sentences and does not appear short of breath  Cardiovascular: No lower extremity edema, non tender, no erythema  Skin: Warm dry intact with no signs of infection or rash on extremities or on axial skeleton.  Abdomen: Soft nontender  Neuro: Cranial nerves II through XII are intact, neurovascularly intact in all extremities with 2+ DTRs and 2+ pulses.  Lymph: No lymphadenopathy of posterior or anterior cervical chain or axillae bilaterally.  Gait normal with good balance and coordination.  MSK:  Non tender with full range of motion and good stability and symmetric strength and tone of shoulders, elbows, wrist, hip, knee and ankles bilaterally.   Back Exam:  Inspection: Mild loss of lordosis Motion: Flexion 40 deg, Extension 25 deg, Side Bending to 45 deg bilaterally,  Rotation to 35 deg bilaterally  SLR laying: Negative  XSLR laying: Negative  Palpable tenderness: Tender to palpation the paraspinal musculature. FABER: negative. Sensory change: Gross sensation intact to all lumbar and sacral dermatomes.  Reflexes: 2+ at both patellar tendons, 2+ at achilles tendons, Babinski's  downgoing.  Strength at foot  Plantar-flexion: 5/5 Dorsi-flexion: 5/5 Eversion: 5/5 Inversion: 5/5  Leg strength  Quad: 5/5 Hamstring: 5/5 Hip flexor: 5/5 Hip abductors: 4/5 symmetric Gait unremarkable.  Patient does have tenderness over the right greater trochanteric area.  Osteopathic findings C2 flexed rotated and side bent right T3 extended rotated and side bent right inhaled third rib T5 extended rotated and side bent left L2 flexed rotated and side bent right Sacrum right on right     Impression and Recommendations:     This case required medical decision making of moderate complexity.      Note: This dictation was prepared with Dragon dictation along with smaller phrase technology. Any transcriptional errors that result from this process are unintentional.

## 2017-06-29 ENCOUNTER — Ambulatory Visit: Payer: 59 | Admitting: Family Medicine

## 2017-06-29 ENCOUNTER — Encounter: Payer: Self-pay | Admitting: Family Medicine

## 2017-06-29 VITALS — BP 104/70 | HR 63 | Wt 170.0 lb

## 2017-06-29 DIAGNOSIS — M999 Biomechanical lesion, unspecified: Secondary | ICD-10-CM | POA: Insufficient documentation

## 2017-06-29 DIAGNOSIS — M7061 Trochanteric bursitis, right hip: Secondary | ICD-10-CM

## 2017-06-29 DIAGNOSIS — M7062 Trochanteric bursitis, left hip: Secondary | ICD-10-CM

## 2017-06-29 NOTE — Assessment & Plan Note (Signed)
Tender overall. Discussed icing regimen.  Patient will come back and see me again in 4 weeks

## 2017-06-29 NOTE — Assessment & Plan Note (Signed)
Decision today to treat with OMT was based on Physical Exam  After verbal consent patient was treated with HVLA, ME, FPR techniques in  thoracic, lumbar and sacral areas  Patient tolerated the procedure well with improvement in symptoms  Patient given exercises, stretches and lifestyle modifications  See medications in patient instructions if given  Patient will follow up in 4 weeks 

## 2017-06-29 NOTE — Patient Instructions (Signed)
Good to see you  Emily Anderson is your friend.  Stay active.  Exercises 3 times a week.  PT will be calling you  Vitamin D 2000 IU daily  Turmeric 500mg  daily  Tart cherry extract any dose at night If the manipulation helps we will continue with that  See me again in 4 weeks

## 2017-07-16 ENCOUNTER — Ambulatory Visit: Payer: 59 | Attending: Family Medicine | Admitting: Physical Therapy

## 2017-07-16 ENCOUNTER — Other Ambulatory Visit: Payer: Self-pay

## 2017-07-16 ENCOUNTER — Encounter: Payer: Self-pay | Admitting: Physical Therapy

## 2017-07-16 DIAGNOSIS — M25552 Pain in left hip: Secondary | ICD-10-CM | POA: Diagnosis present

## 2017-07-16 DIAGNOSIS — M545 Low back pain, unspecified: Secondary | ICD-10-CM

## 2017-07-16 DIAGNOSIS — M25551 Pain in right hip: Secondary | ICD-10-CM | POA: Diagnosis not present

## 2017-07-16 NOTE — Therapy (Signed)
[facility] [place], [place], [address] Phone: [phone]   Fax:  [phone]  Physical Therapy Evaluation  Patient Details  Name: Emily Anderson MRN: [id number] Date of Birth: 1960/06/18 Referring Provider: Creig Hines   Encounter Date: 07/16/2017  PT End of Session - 07/16/17 1535    Visit Number  1    Date for PT Re-Evaluation  09/15/17    PT Start Time  4193    PT Stop Time  1530    PT Time Calculation (min)  45 min    Activity Tolerance  Patient tolerated treatment well    Behavior During Therapy  [facility] for tasks assessed/performed       Past Medical History:  Diagnosis Date  . Dyspepsia    food triggers  . Heart murmur   . Hyperlipidemia   . Hypertension   . Hypothyroidism     Past Surgical History:  Procedure Laterality Date  . ABLATION    . CESAREAN SECTION     G 2 P 2, both C sections  . COLONOSCOPY  2009   negative; [facility]  . LAPAROSCOPIC VAGINAL HYSTERECTOMY WITH SALPINGECTOMY Bilateral 08/02/2015   Procedure: LAPAROSCOPIC ASSISTED VAGINAL HYSTERECTOMY WITH SALPINGECTOMY;  Surgeon: Paula Compton, MD;  Location: [facility];  Service: Gynecology;  Laterality: Bilateral;    There were no vitals filed for this visit.   Subjective Assessment - 07/16/17 1443    Subjective  Patient with c/o bilateral hip pain, reports she has had some pain for a number of years.  She reports that she never sought care.  She reports that this episode really was more painful.  X-rays show some arthritis.  Had a cortisone injection that helped.  She also describes having back pain as well    Limitations  Walking;Standing    Patient Stated Goals  have less pain    Currently in Pain?  Yes    Pain Score  3     Pain Location  Hip    Pain Orientation  Right;Left    Pain Descriptors / Indicators  Dull;Sharp;Heaviness    Pain Type  Acute pain    Pain Radiating Towards  reports at times has some pain into the  buttocks    Pain Onset  More than a month ago    Pain Frequency  Constant    Aggravating Factors   sitting, long periods, walking pain up to 8/10    Pain Relieving Factors  just easy motions can help get pain to a 1-2/10    Effect of Pain on Daily Activities  difficulty with getting up and down, walking         [facility] PT Assessment - 07/16/17 0001      Assessment   Medical Diagnosis  bilateral hip pain    Referring Provider  Z. Smith    Onset Date/Surgical Date  06/18/17    Prior Therapy  no      Precautions   Precautions  None      Balance Screen   Has the patient fallen in the past 6 months  No    Has the patient had a decrease in activity level because of a fear of falling?   No    Is the patient reluctant to leave their home because of a fear of falling?   No      Home Environment   Additional Comments  has stairs, does housework, does yardwork  Prior Function   Level of Independence  Independent    Vocation  Full time employment    Vocation Requirements  mostly sitting, some stairs    Leisure  walks on the tmill daily 1-1.5 hours      Posture/Postural Control   Posture Comments  fwd head, rounded shoulders      ROM / Strength   AROM / PROM / Strength  AROM;Strength      AROM   Overall AROM Comments  WNL's with some hesitaiton due to hip pain      Strength   Overall Strength Comments  hips 4+/5 for abduction and adduction, 4-/5 for other motions      Flexibility   Soft Tissue Assessment /Muscle Length  yes    Hamstrings  very tight    Quadriceps  tight    ITB  very tight    Piriformis  very tight      Palpation   Palpation comment  very tender in the GT area's bilaterally      Ambulation/Gait   Gait Comments  no device, mild right antlagic gait             Objective measurements completed on examination: See above findings.      [facility] Adult PT Treatment/Exercise - 07/16/17 0001      Modalities   Modalities  Iontophoresis       Iontophoresis   Type of Iontophoresis  Dexamethasone    Location  right GT area    Dose  80mA    Time  4 hour patch             PT Education - 07/16/17 1533    Education provided  Yes    Education Details  LE stretches    Person(s) Educated  Patient    Methods  Explanation;Demonstration;Tactile cues;Handout;Verbal cues    Comprehension  Verbalized understanding;Returned demonstration       PT Short Term Goals - 07/16/17 1611      PT SHORT TERM GOAL #1   Title  independent with initial HEP    Time  2    Period  Weeks    Status  New        PT Long Term Goals - 07/16/17 1611      PT LONG TERM GOAL #1   Title  independent with a varied exercises program that includes flexibility and core    Time  8    Period  Weeks    Status  New      PT LONG TERM GOAL #2   Title  report pain decreased 50%    Time  8    Period  Weeks    Status  New      PT LONG TERM GOAL #3   Title  increase strength of the hips to 4+/5    Time  8    Period  Weeks    Status  New      PT LONG TERM GOAL #4   Title  increase flexibility to the point hse has no fear while doing it, at evaluation she was very hesitant due to pain    Time  8    Period  Weeks    Status  New             Plan - 07/16/17 1539    Clinical Impression Statement  Patient reports some pain for a number of years, reports that her right hip is worse than the  left.  She was diagnosed with GT bursitis.  She is very tight in the LE mms.  She has DDD of the lumbar spine.  Walks on Treadmill 1-1.5 hrs a day.    Clinical Presentation  Stable    Clinical Decision Making  Low    Rehab Potential  Good    PT Frequency  2x / week    PT Duration  8 weeks    PT Treatment/Interventions  ADLs/Self Care Home Management;Cryotherapy;Electrical Stimulation;Iontophoresis 4mg /ml Dexamethasone;Moist Heat;Traction;Therapeutic exercise;Therapeutic activities;Patient/family education;Manual techniques    PT Next Visit Plan  try lumbar  traction    Consulted and Agree with Plan of Care  Patient       Patient will benefit from skilled therapeutic intervention in order to improve the following deficits and impairments:  Decreased range of motion, Difficulty walking, Pain, Impaired flexibility, Postural dysfunction, Improper body mechanics, Decreased strength, Decreased mobility  Visit Diagnosis: Pain in right hip - Plan: PT plan of care cert/re-cert  Pain in left hip - Plan: PT plan of care cert/re-cert  Acute bilateral low back pain without sciatica - Plan: PT plan of care cert/re-cert     Problem List Patient Active Problem List   Diagnosis Date Noted  . Nonallopathic lesion of lumbosacral region 06/29/2017  . Nonallopathic lesion of sacral region 06/29/2017  . Nonallopathic lesion of thoracic region 06/29/2017  . Greater trochanteric bursitis of both hips 05/28/2017  . Family history of muscular dystrophy 05/11/2017  . Pain of both hip joints 05/11/2017  . Arthralgia 05/11/2017  . Constipation 04/03/2016  . S/P laparoscopic assisted vaginal hysterectomy (LAVH) 08/02/2015  . Hypothyroidism 02/13/2015  . Greater trochanteric bursitis of right hip 05/19/2014  . Essential hypertension 07/19/2009  . Hyperlipidemia 08/01/2007  . DYSPEPSIA 04/04/2007    Sumner Boast., PT 07/16/2017, 4:15 PM  [place] [address] [place], [place], [address] Phone: [phone]   Fax:  [phone]  Name: Emily Anderson MRN: [id number] Date of Birth: [birth year]

## 2017-07-18 ENCOUNTER — Other Ambulatory Visit (INDEPENDENT_AMBULATORY_CARE_PROVIDER_SITE_OTHER): Payer: 59

## 2017-07-18 DIAGNOSIS — E038 Other specified hypothyroidism: Secondary | ICD-10-CM

## 2017-07-18 LAB — T3, FREE: T3 FREE: 2.6 pg/mL (ref 2.3–4.2)

## 2017-07-18 LAB — T4, FREE: FREE T4: 0.6 ng/dL (ref 0.60–1.60)

## 2017-07-18 LAB — TSH: TSH: 4.46 u[IU]/mL (ref 0.35–4.50)

## 2017-07-19 ENCOUNTER — Encounter: Payer: Self-pay | Admitting: Internal Medicine

## 2017-07-19 LAB — THYROID ANTIBODIES: Thyroperoxidase Ab SerPl-aCnc: 19 IU/mL — ABNORMAL HIGH (ref <9)

## 2017-07-25 ENCOUNTER — Encounter: Payer: Self-pay | Admitting: Physical Therapy

## 2017-07-25 ENCOUNTER — Ambulatory Visit: Payer: 59 | Admitting: Physical Therapy

## 2017-07-25 DIAGNOSIS — M25551 Pain in right hip: Secondary | ICD-10-CM | POA: Diagnosis not present

## 2017-07-25 DIAGNOSIS — M25552 Pain in left hip: Secondary | ICD-10-CM

## 2017-07-25 DIAGNOSIS — M545 Low back pain, unspecified: Secondary | ICD-10-CM

## 2017-07-25 NOTE — Therapy (Signed)
[place] [place], [place], [address] Phone: [phone]   Fax:  [phone]  Physical Therapy Treatment  Patient Details  Name: Emily Anderson MRN: [id number] Date of Birth: Feb 08, 1961 Referring Provider: Creig Hines   Encounter Date: 07/25/2017  PT End of Session - 07/25/17 1430    Visit Number  2    Date for PT Re-Evaluation  09/15/17    PT Start Time  1400    PT Stop Time  1445    PT Time Calculation (min)  45 min    Activity Tolerance  Patient tolerated treatment well    Behavior During Therapy  [facility] for tasks assessed/performed       Past Medical History:  Diagnosis Date  . Dyspepsia    food triggers  . Heart murmur   . Hyperlipidemia   . Hypertension   . Hypothyroidism     Past Surgical History:  Procedure Laterality Date  . ABLATION    . CESAREAN SECTION     G 2 P 2, both C sections  . COLONOSCOPY  2009   negative; [facility]  . LAPAROSCOPIC VAGINAL HYSTERECTOMY WITH SALPINGECTOMY Bilateral 08/02/2015   Procedure: LAPAROSCOPIC ASSISTED VAGINAL HYSTERECTOMY WITH SALPINGECTOMY;  Surgeon: Paula Compton, MD;  Location: [facility];  Service: Gynecology;  Laterality: Bilateral;    There were no vitals filed for this visit.  Subjective Assessment - 07/25/17 1402    Subjective  REports that she feels like the stretches really have helped the hip pain.  Reports pushing a 50# box across the floor yesterday and had some increased pain in the hip and the back    Currently in Pain?  Yes    Pain Score  2     Pain Location  Back    Pain Orientation  Lower    Pain Relieving Factors  the stretches really help                No data recorded       [facility] Adult PT Treatment/Exercise - 07/25/17 0001      Exercises   Exercises  Lumbar      Lumbar Exercises: Stretches   Passive Hamstring Stretch  3 reps;20 seconds    Quad Stretch  4 reps;20 seconds    ITB Stretch  4 reps;20  seconds    Piriformis Stretch  4 reps;20 seconds      Lumbar Exercises: Aerobic   Nustep  level 5 x 5 minutes      Lumbar Exercises: Supine   Bridge  20 reps;1 second    Other Supine Lumbar Exercises  feet on ball K2C, trunk rotations, bridges, isometric abs      Modalities   Modalities  Traction      Traction   Type of Traction  Lumbar    Min (lbs)  60    Hold Time  static    Time  15             PT Education - 07/25/17 1430    Education provided  Yes    Education Details  gave Wms flexion    Person(s) Educated  Patient    Methods  Explanation;Demonstration;Tactile cues;Verbal cues;Handout    Comprehension  Verbalized understanding;Returned demonstration       PT Short Term Goals - 07/25/17 1432      PT SHORT TERM GOAL #1   Title  independent with initial HEP    Status  Achieved        PT Long Term Goals - 07/16/17 1611      PT LONG TERM GOAL #1   Title  independent with a varied exercises program that includes flexibility and core    Time  8    Period  Weeks    Status  New      PT LONG TERM GOAL #2   Title  report pain decreased 50%    Time  8    Period  Weeks    Status  New      PT LONG TERM GOAL #3   Title  increase strength of the hips to 4+/5    Time  8    Period  Weeks    Status  New      PT LONG TERM GOAL #4   Title  increase flexibility to the point hse has no fear while doing it, at evaluation she was very hesitant due to pain    Time  8    Period  Weeks    Status  New            Plan - 07/25/17 1430    Clinical Impression Statement  Patient reports that she really feels tha tthe stretches helped.  She did well with the exercises today.  She had some pulling in the left low back with the Wms flexion but she reported it felt good.  Remains very tight in the LE's    PT Next Visit Plan  see how traction did with her    Consulted and Agree with Plan of Care  Patient       Patient will benefit from skilled therapeutic  intervention in order to improve the following deficits and impairments:  Decreased range of motion, Difficulty walking, Pain, Impaired flexibility, Postural dysfunction, Improper body mechanics, Decreased strength, Decreased mobility  Visit Diagnosis: Pain in right hip  Pain in left hip  Acute bilateral low back pain without sciatica     Problem List Patient Active Problem List   Diagnosis Date Noted  . Nonallopathic lesion of lumbosacral region 06/29/2017  . Nonallopathic lesion of sacral region 06/29/2017  . Nonallopathic lesion of thoracic region 06/29/2017  . Greater trochanteric bursitis of both hips 05/28/2017  . Family history of muscular dystrophy 05/11/2017  . Pain of both hip joints 05/11/2017  . Arthralgia 05/11/2017  . Constipation 04/03/2016  . S/P laparoscopic assisted vaginal hysterectomy (LAVH) 08/02/2015  . Hypothyroidism 02/13/2015  . Greater trochanteric bursitis of right hip 05/19/2014  . Essential hypertension 07/19/2009  . Hyperlipidemia 08/01/2007  . DYSPEPSIA 04/04/2007    Sumner Boast., PT 07/25/2017, 2:32 PM  [address] [place], [place], [address] Phone: [phone]   Fax:  [phone]  Name: Emily Anderson MRN: [id number] Date of Birth: 08/28/1960

## 2017-07-27 ENCOUNTER — Ambulatory Visit: Payer: 59 | Admitting: Family Medicine

## 2017-08-01 ENCOUNTER — Ambulatory Visit: Payer: 59 | Admitting: Family Medicine

## 2017-08-01 ENCOUNTER — Encounter: Payer: Self-pay | Admitting: Family Medicine

## 2017-08-01 VITALS — BP 108/70 | HR 72 | Ht 68.0 in | Wt 170.0 lb

## 2017-08-01 DIAGNOSIS — M999 Biomechanical lesion, unspecified: Secondary | ICD-10-CM | POA: Diagnosis not present

## 2017-08-01 DIAGNOSIS — M7062 Trochanteric bursitis, left hip: Secondary | ICD-10-CM | POA: Diagnosis not present

## 2017-08-01 DIAGNOSIS — M7061 Trochanteric bursitis, right hip: Secondary | ICD-10-CM

## 2017-08-01 NOTE — Assessment & Plan Note (Signed)
Still likely more secondary to the back.  Does have also arthritic changes.  Has responded well to osteopathic manipulation.  No significant changes in other treatment options at this time.  Patient will follow up with me again in 4-6 weeks

## 2017-08-01 NOTE — Assessment & Plan Note (Signed)
Decision today to treat with OMT was based on Physical Exam  After verbal consent patient was treated with HVLA, ME, FPR techniques in cervical, thoracic, lumbar and sacral areas  Patient tolerated the procedure well with improvement in symptoms  Patient given exercises, stretches and lifestyle modifications  See medications in patient instructions if given  Patient will follow up in 4-6 weeks 

## 2017-08-01 NOTE — Patient Instructions (Signed)
Good to see you  I am impressed  Up to you on PT  Lets double the tart cherry and see how it goes.  Continue the exercises  See me again in 5-6 weeks

## 2017-08-01 NOTE — Progress Notes (Signed)
[name] [address] [place], [place] [address] Phone: [phone] Subjective:     CC: Back pain follow-up  GYK:[id number]  Emily Anderson is a 57 y.o. female coming in with complaint of back pain.  Patient was seen multiple times for greater trochanteric bursitis bilaterally.  Does have osteophytic changes from L2-L3.  Taking the gabapentin and the over-the-counter medications.  Patient feels like also physical therapy was helpful.  Is making progress.  Not as much pain in the piriformis.  Still seems that sitting seems to exacerbate it.     Past Medical History:  Diagnosis Date  . Dyspepsia    food triggers  . Heart murmur   . Hyperlipidemia   . Hypertension   . Hypothyroidism    Past Surgical History:  Procedure Laterality Date  . ABLATION    . CESAREAN SECTION     G 2 P 2, both C sections  . COLONOSCOPY  2009   negative; [facility]  . LAPAROSCOPIC VAGINAL HYSTERECTOMY WITH SALPINGECTOMY Bilateral 08/02/2015   Procedure: LAPAROSCOPIC ASSISTED VAGINAL HYSTERECTOMY WITH SALPINGECTOMY;  Surgeon: Paula Compton, MD;  Location: [facility];  Service: Gynecology;  Laterality: Bilateral;   Social History   Socioeconomic History  . Marital status: Married    Spouse name: Not on file  . Number of children: Not on file  . Years of education: Not on file  . Highest education level: Not on file  Occupational History  . 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 Financial resource strain: Not on file  . Food insecurity:    Worry: Not on file    Inability: Not on file  . Transportation needs:    Medical: Not on file    Non-medical: Not on file  Tobacco Use  . Smoking status: Never Smoker  . Smokeless tobacco: Never Used  Substance and Sexual Activity  . Alcohol use: No  . Drug use: No  . Sexual activity: Yes  Lifestyle  . Physical activity:    Days per week: Not on file    Minutes per session:  Not on file  . Stress: Not on file  Relationships  . Social connections:    Talks on phone: Not on file    Gets together: Not on file    Attends religious service: Not on file    Active member of club or organization: Not on file    Attends meetings of clubs or organizations: Not on file    Relationship status: Not on file  Other Topics Concern  . Not on file  Social History Narrative   REG EXERCISE..   LOW CARB LOW NA DIET   Allergies  Allergen Reactions  . Amoxicillin Rash    Rash Because of a history of documented adverse serious drug reaction;Medi Alert braceletis recommended Has patient had a PCN reaction causing immediate rash, facial/tongue/throat swelling, SOB or lightheadedness with hypotension: No Has patient had a PCN reaction causing severe rash involving mucus membranes or skin necrosis: No Has patient had a PCN reaction that required hospitalization No Has patient had a PCN reaction occurring within the last 10 years: No If all of the above answers are "N Rash Because of a history of documented adverse serious drug reaction;Medi Alert bracelet  is recommended Has patient had a PCN reaction causing immediate rash, facial/tongue/throat swelling, SOB or lightheadedness with hypotension: No Has patient had a PCN reaction causing severe rash  involving mucus membranes or skin necrosis: No Has patient had a PCN reaction that required hospitalization No Has patient had a PCN reaction occurring within the last 10 years: No If all of the above answers are "N  . Hctz [Hydrochlorothiazide] Other (See Comments)    03/2012 K+ 3.3 03/2012 K+ 3.3  . Sulfamethoxazole-Trimethoprim Rash    H/a's, heart racing & flu symptoms   Family History  Problem Relation Age of Onset  . Thyroid cancer Mother   . Prostate cancer Father   . Heart disease Father 84       angioplasty   . Colon polyps Father   . Diabetes Father   . Other Father 33       carcinoid tumor   . Diabetes  Paternal Aunt        1  . Diabetes Paternal Uncle         2  . Colon polyps Sister   . Muscular dystrophy Sister   . Stroke Neg Hx      Past medical history, social, surgical and family history all reviewed in electronic medical record.  No pertanent information unless stated regarding to the chief complaint.   Review of Systems:Review of systems updated and as accurate as of 08/01/17  No headache, visual changes, nausea, vomiting, diarrhea, constipation, dizziness, abdominal pain, skin rash, fevers, chills, night sweats, weight loss, swollen lymph nodes, body aches, joint swelling, m chest pain, shortness of breath, mood changes.  Mild positive muscle aches  Objective  Blood pressure 108/70, pulse 72, height 5\' 8"  (1.727 m), weight 170 lb (77.1 kg), last menstrual period 07/19/2015, SpO2 96 %. Systems examined below as of 08/01/17   General: No apparent distress alert and oriented x3 mood and affect normal, dressed appropriately.  HEENT: Pupils equal, extraocular movements intact  Respiratory: Patient's speak in full sentences and does not appear short of breath  Cardiovascular: No lower extremity edema, non tender, no erythema  Skin: Warm dry intact with no signs of infection or rash on extremities or on axial skeleton.  Abdomen: Soft nontender  Neuro: Cranial nerves II through XII are intact, neurovascularly intact in all extremities with 2+ DTRs and 2+ pulses.  Lymph: No lymphadenopathy of posterior or anterior cervical chain or axillae bilaterally.  Gait normal with good balance and coordination.  MSK:  Non tender with full range of motion and good stability and symmetric strength and tone of shoulders, elbows, wrist, hip, knee and ankles bilaterally.  Back Exam:  Inspection: Unremarkable  Motion: Flexion 45 deg, Extension 45 deg, Side Bending to 45 deg bilaterally,  Rotation to 45 deg bilaterally  SLR laying: Negative  XSLR laying: Negative  Palpable tenderness: Mild  tenderness to palpation of the paraspinal musculature lumbar spine right greater than left. FABER: Mild positive on the left. Sensory change: Gross sensation intact to all lumbar and sacral dermatomes.  Reflexes: 2+ at both patellar tendons, 2+ at achilles tendons, Babinski's downgoing.  Strength at foot  Plantar-flexion: 5/5 Dorsi-flexion: 5/5 Eversion: 5/5 Inversion: 5/5  Leg strength  Quad: 5/5 Hamstring: 5/5 Hip flexor: 5/5 Hip abductors: 5/5  Gait unremarkable.  Osteopathic findings C5 flexed rotated and side bent left T2 extended rotated and side bent right  T6 extended rotated and side bent left L2 flexed rotated and side bent right Sacrum  left on left     Impression and Recommendations:     This case required medical decision making of moderate complexity.      Note:  This dictation was prepared with Dragon dictation along with smaller phrase technology. Any transcriptional errors that result from this process are unintentional.

## 2017-08-06 ENCOUNTER — Ambulatory Visit: Payer: 59 | Admitting: Physical Therapy

## 2017-08-10 ENCOUNTER — Ambulatory Visit (AMBULATORY_SURGERY_CENTER): Payer: Self-pay

## 2017-08-10 ENCOUNTER — Other Ambulatory Visit: Payer: Self-pay

## 2017-08-10 VITALS — Ht 68.0 in | Wt 168.0 lb

## 2017-08-10 DIAGNOSIS — Z1211 Encounter for screening for malignant neoplasm of colon: Secondary | ICD-10-CM

## 2017-08-10 MED ORDER — NA SULFATE-K SULFATE-MG SULF 17.5-3.13-1.6 GM/177ML PO SOLN: 1.0000 | Freq: Once | ORAL | 0 refills | Status: AC

## 2017-08-10 NOTE — Progress Notes (Signed)
Denies allergies to eggs or soy products. Denies complication of anesthesia or sedation. Denies use of weight loss medication. Denies use of O2.   Emmi instructions declined.  

## 2017-08-20 ENCOUNTER — Encounter: Payer: Self-pay | Admitting: Gastroenterology

## 2017-08-31 ENCOUNTER — Ambulatory Visit (AMBULATORY_SURGERY_CENTER): Payer: 59 | Admitting: Gastroenterology

## 2017-08-31 ENCOUNTER — Other Ambulatory Visit: Payer: Self-pay

## 2017-08-31 ENCOUNTER — Encounter: Payer: Self-pay | Admitting: Gastroenterology

## 2017-08-31 VITALS — BP 114/74 | HR 61 | Temp 96.4°F | Resp 22 | Ht 68.0 in | Wt 168.0 lb

## 2017-08-31 DIAGNOSIS — Z1211 Encounter for screening for malignant neoplasm of colon: Secondary | ICD-10-CM

## 2017-08-31 DIAGNOSIS — D124 Benign neoplasm of descending colon: Secondary | ICD-10-CM

## 2017-08-31 DIAGNOSIS — K635 Polyp of colon: Secondary | ICD-10-CM

## 2017-08-31 MED ORDER — SODIUM CHLORIDE 0.9 % IV SOLN: 500.0000 mL | INTRAVENOUS | Status: DC

## 2017-08-31 NOTE — Patient Instructions (Signed)
INFORMATION ON POLYPS GIVEN.   YOU HAD AN ENDOSCOPIC PROCEDURE TODAY AT THE [facility]:   Refer to the procedure report that was given to you for any specific questions about what was found during the examination.  If the procedure report does not answer your questions, please call your gastroenterologist to clarify.  If you requested that your care partner not be given the details of your procedure findings, then the procedure report has been included in a sealed envelope for you to review at your convenience later.  YOU SHOULD EXPECT: Some feelings of bloating in the abdomen. Passage of more gas than usual.  Walking can help get rid of the air that was put into your GI tract during the procedure and reduce the bloating. If you had a lower endoscopy (such as a colonoscopy or flexible sigmoidoscopy) you may notice spotting of blood in your stool or on the toilet paper. If you underwent a bowel prep for your procedure, you may not have a normal bowel movement for a few days.  Please Note:  You might notice some irritation and congestion in your nose or some drainage.  This is from the oxygen used during your procedure.  There is no need for concern and it should clear up in a day or so.  SYMPTOMS TO REPORT IMMEDIATELY:   Following lower endoscopy (colonoscopy or flexible sigmoidoscopy):  Excessive amounts of blood in the stool  Significant tenderness or worsening of abdominal pains  Swelling of the abdomen that is new, acute  Fever of 100F or higher    For urgent or emergent issues, a gastroenterologist can be reached at any hour by calling [phone].   DIET:  We do recommend a small meal at first, but then you may proceed to your regular diet.  Drink plenty of fluids but you should avoid alcoholic beverages for 24 hours.  ACTIVITY:  You should plan to take it easy for the rest of today and you should NOT DRIVE or use heavy machinery until tomorrow (because of the sedation  medicines used during the test).    FOLLOW UP: Our staff will call the number listed on your records the next business day following your procedure to check on you and address any questions or concerns that you may have regarding the information given to you following your procedure. If we do not reach you, we will leave a message.  However, if you are feeling well and you are not experiencing any problems, there is no need to return our call.  We will assume that you have returned to your regular daily activities without incident.  If any biopsies were taken you will be contacted by phone or by letter within the next 1-3 weeks.  Please call us at [phone] if you have not heard about the biopsies in 3 weeks.    SIGNATURES/CONFIDENTIALITY: You and/or your care partner have signed paperwork which will be entered into your electronic medical record.  These signatures attest to the fact that that the information above on your After Visit Summary has been reviewed and is understood.  Full responsibility of the confidentiality of this discharge information lies with you and/or your care-partner. 

## 2017-08-31 NOTE — Op Note (Signed)
[facility] Patient Name: Emily Anderson Procedure Date: 08/31/2017 10:09 AM MRN: [id number] Endoscopist: Milus Banister , MD Age: 57 Referring MD:  Date of Birth: 04-25-1961 Gender: Female Account #: [id number] Procedure:                Colonoscopy Indications:              Screening for colorectal malignant neoplasm Medicines:                Monitored Anesthesia Care Procedure:                Pre-Anesthesia Assessment:                           - Prior to the procedure, a History and Physical                            was performed, and patient medications and                            allergies were reviewed. The patient's tolerance of                            previous anesthesia was also reviewed. The risks                            and benefits of the procedure and the sedation                            options and risks were discussed with the patient.                            All questions were answered, and informed consent                            was obtained. Prior Anticoagulants: The patient has                            taken no previous anticoagulant or antiplatelet                            agents. ASA Grade Assessment: II - A patient with                            mild systemic disease. After reviewing the risks                            and benefits, the patient was deemed in                            satisfactory condition to undergo the procedure.                           After obtaining informed consent, the colonoscope  was passed under direct vision. Throughout the                            procedure, the patient's blood pressure, pulse, and                            oxygen saturations were monitored continuously. The                            Colonoscope was introduced through the anus and                            advanced to the the cecum, identified by                            appendiceal orifice and  ileocecal valve. The                            colonoscopy was performed without difficulty. The                            patient tolerated the procedure well. The quality                            of the bowel preparation was good. The ileocecal                            valve, appendiceal orifice, and rectum were                            photographed. Scope In: 10:13:00 AM Scope Out: 10:26:01 AM Scope Withdrawal Time: 0 hours 10 minutes 36 seconds  Total Procedure Duration: 0 hours 13 minutes 1 second  Findings:                 A 2-3 mm polyp was found in the descending colon.                            The polyp was sessile. The polyp was removed with a                            cold snare. Resection was complete, but the polyp                            tissue was not retrieved.                           The exam was otherwise without abnormality on                            direct and retroflexion views. Complications:            No immediate complications. Estimated blood loss:  None. Estimated Blood Loss:     Estimated blood loss: none. Impression:               - One 2 mm polyp in the descending colon, removed                            with a cold snare. Complete resection. Polyp tissue                            not retrieved.                           - The examination was otherwise normal on direct                            and retroflexion views. Recommendation:           - Patient has a contact number available for                            emergencies. The signs and symptoms of potential                            delayed complications were discussed with the                            patient. Return to normal activities tomorrow.                            Written discharge instructions were provided to the                            patient.                           - Resume previous diet.                           - Continue  present medications.                           - Repeat colonoscopy in 10 years for screening                            purposes. Milus Banister, MD 08/31/2017 10:29:26 AM This report has been signed electronically.

## 2017-08-31 NOTE — Progress Notes (Signed)
Called to room to assist during endoscopic procedure.  Patient ID and intended procedure confirmed with present staff. Received instructions for my participation in the procedure from the performing physician.  

## 2017-08-31 NOTE — Progress Notes (Signed)
Report to PACU, RN, vss, BBS= Clear.  

## 2017-08-31 NOTE — Progress Notes (Signed)
Pt's states no medical or surgical changes since previsit or office visit. 

## 2017-09-03 ENCOUNTER — Telehealth: Payer: Self-pay

## 2017-09-03 NOTE — Telephone Encounter (Signed)
  Follow up Call-  Call Steffany Schoenfelder number 08/31/2017  Post procedure Call Wilhelmena Zea phone  # [phone]  Permission to leave phone message Yes  Some recent data might be hidden     Patient questions:  Do you have a fever, pain , or abdominal swelling? No. Pain Score  0 *  Have you tolerated food without any problems? Yes.    Have you been able to return to your normal activities? Yes.    Do you have any questions about your discharge instructions: Diet   No. Medications  No. Follow up visit  No.  Do you have questions or concerns about your Care? No.  Actions: * If pain score is 4 or above: No action needed, pain <4.

## 2017-09-09 NOTE — Progress Notes (Signed)
[facility] [place], [place] [address] Phone: [phone] Subjective:      CC: Back pain follow-up  LPF:[id number]  Emily Anderson is a 57 y.o. female coming in with complaint of moderate arthritis.  Discussed with patient in great length.  Patient has symptoms.  Discussed with patient about icing regimen and home exercises.  Discussed which activities of doing which wants to avoid.  Has been doing it but has noticed some tightness low.  Patient states that unfortunately has some discomfort overall.  After doing yard work started having worsening pain again.    Past Medical History:  Diagnosis Date  . Allergy   . Dyspepsia    food triggers  . GERD (gastroesophageal reflux disease)   . Heart murmur   . Hyperlipidemia   . Hypertension   . Hypothyroidism    Past Surgical History:  Procedure Laterality Date  . ABLATION    . CESAREAN SECTION     G 2 P 2, both C sections  . COLONOSCOPY  2009   negative; [facility]  . LAPAROSCOPIC VAGINAL HYSTERECTOMY WITH SALPINGECTOMY Bilateral 08/02/2015   Procedure: LAPAROSCOPIC ASSISTED VAGINAL HYSTERECTOMY WITH SALPINGECTOMY;  Surgeon: Paula Compton, MD;  Location: [facility];  Service: Gynecology;  Laterality: Bilateral;   Social History   Socioeconomic History  . Marital status: Married    Spouse name: Not on file  . Number of children: Not on file  . Years of education: Not on file  . Highest education level: Not on file  Occupational History  . 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 Financial resource strain: Not on file  . Food insecurity:    Worry: Not on file    Inability: Not on file  . Transportation needs:    Medical: Not on file    Non-medical: Not on file  Tobacco Use  . Smoking status: Never Smoker  . Smokeless tobacco: Never Used  Substance and Sexual Activity  . Alcohol use: No  . Drug use: No  . Sexual activity: Yes    Lifestyle  . Physical activity:    Days per week: Not on file    Minutes per session: Not on file  . Stress: Not on file  Relationships  . Social connections:    Talks on phone: Not on file    Gets together: Not on file    Attends religious service: Not on file    Active member of club or organization: Not on file    Attends meetings of clubs or organizations: Not on file    Relationship status: Not on file  Other Topics Concern  . Not on file  Social History Narrative   REG EXERCISE..   LOW CARB LOW NA DIET   Allergies  Allergen Reactions  . Amoxicillin Rash    Rash Because of a history of documented adverse serious drug reaction;Medi Alert braceletis recommended Has patient had a PCN reaction causing immediate rash, facial/tongue/throat swelling, SOB or lightheadedness with hypotension: No Has patient had a PCN reaction causing severe rash involving mucus membranes or skin necrosis: No Has patient had a PCN reaction that required hospitalization No Has patient had a PCN reaction occurring within the last 10 years: No If all of the above answers are "N Rash Because of a history of documented adverse serious drug reaction;Medi Alert bracelet  is recommended Has patient had a PCN reaction causing immediate rash,  facial/tongue/throat swelling, SOB or lightheadedness with hypotension: No Has patient had a PCN reaction causing severe rash involving mucus membranes or skin necrosis: No Has patient had a PCN reaction that required hospitalization No Has patient had a PCN reaction occurring within the last 10 years: No If all of the above answers are "N  . Hctz [Hydrochlorothiazide] Other (See Comments)    03/2012 K+ 3.3 03/2012 K+ 3.3  . Sulfamethoxazole-Trimethoprim Rash    H/a's, heart racing & flu symptoms   Family History  Problem Relation Age of Onset  . Thyroid cancer Mother   . Prostate cancer Father   . Heart disease Father 40       angioplasty   . Colon polyps  Father   . Diabetes Father   . Other Father 9       carcinoid tumor   . Colon cancer Father   . Diabetes Paternal Aunt        1  . Diabetes Paternal Uncle         2  . Colon polyps Sister   . Muscular dystrophy Sister   . Stroke Neg Hx   . Esophageal cancer Neg Hx   . Liver cancer Neg Hx   . Pancreatic cancer Neg Hx   . Rectal cancer Neg Hx   . Stomach cancer Neg Hx      Past medical history, social, surgical and family history all reviewed in electronic medical record.  No pertanent information unless stated regarding to the chief complaint.   Review of Systems:Review of systems updated and as accurate as of 09/10/17  No headache, visual changes, nausea, vomiting, diarrhea, constipation, dizziness, abdominal pain, skin rash, fevers, chills, night sweats, weight loss, swollen lymph nodes, body aches, joint swelling, muscle aches, chest pain, shortness of breath, mood changes.   Objective  Blood pressure 100/70, pulse 66, height 5\' 8"  (1.727 m), weight 174 lb (78.9 kg), last menstrual period 07/19/2015, SpO2 96 %. Systems examined below as of 09/10/17   General: No apparent distress alert and oriented x3 mood and affect normal, dressed appropriately.  HEENT: Pupils equal, extraocular movements intact  Respiratory: Patient's speak in full sentences and does not appear short of breath  Cardiovascular: No lower extremity edema, non tender, no erythema  Skin: Warm dry intact with no signs of infection or rash on extremities or on axial skeleton.  Abdomen: Soft nontender  Neuro: Cranial nerves II through XII are intact, neurovascularly intact in all extremities with 2+ DTRs and 2+ pulses.  Lymph: No lymphadenopathy of posterior or anterior cervical chain or axillae bilaterally.  Gait normal with good balance and coordination.  MSK:  Non tender with full range of motion and good stability and symmetric strength and tone of shoulders, elbows, wrist, hip, knee and ankles bilaterally.    Back exam shows significant loss of lordosis lumbar spine.  Severe tightness of the left paraspinal musculature mostly around L2-L3.  Negative straight leg test.  Mild positive Corky Sox on the left.  Limited range of motion as stated before.  Deep tendon reflexes intact.  Osteopathic findings T5 extended rotated and side bent left L3 flexed rotated and side bent right Sacrum right on right     Impression and Recommendations:     This case required medical decision making of moderate complexity.      Note: This dictation was prepared with Dragon dictation along with smaller phrase technology. Any transcriptional errors that result from this process are unintentional.

## 2017-09-10 ENCOUNTER — Ambulatory Visit: Payer: 59 | Admitting: Family Medicine

## 2017-09-10 ENCOUNTER — Encounter: Payer: Self-pay | Admitting: Family Medicine

## 2017-09-10 VITALS — BP 100/70 | HR 66 | Ht 68.0 in | Wt 174.0 lb

## 2017-09-10 DIAGNOSIS — M51369 Other intervertebral disc degeneration, lumbar region without mention of lumbar back pain or lower extremity pain: Secondary | ICD-10-CM | POA: Insufficient documentation

## 2017-09-10 DIAGNOSIS — M5136 Other intervertebral disc degeneration, lumbar region: Secondary | ICD-10-CM | POA: Insufficient documentation

## 2017-09-10 DIAGNOSIS — M999 Biomechanical lesion, unspecified: Secondary | ICD-10-CM

## 2017-09-10 NOTE — Assessment & Plan Note (Signed)
Moderate arthritis.  Discussed icing regimen and home exercise.  Discussed which activities to doing which wants to avoid.  Patient is to increase activity slowly over the course the next several days.  Follow-up again in 6 to 8 weeks

## 2017-09-10 NOTE — Assessment & Plan Note (Signed)
Decision today to treat with OMT was based on Physical Exam  After verbal consent patient was treated with HVLA, ME, FPR techniques in  thoracic, lumbar and sacral areas  Patient tolerated the procedure well with improvement in symptoms  Patient given exercises, stretches and lifestyle modifications  See medications in patient instructions if given  Patient will follow up in  6-8 weeks 

## 2017-09-10 NOTE — Patient Instructions (Signed)
Good to see you  You will do great  New mattress could help  Ice may be good  See me again in 6-8 weeks

## 2017-09-12 DIAGNOSIS — L821 Other seborrheic keratosis: Secondary | ICD-10-CM | POA: Diagnosis not present

## 2017-09-12 DIAGNOSIS — L814 Other melanin hyperpigmentation: Secondary | ICD-10-CM | POA: Diagnosis not present

## 2017-09-12 DIAGNOSIS — L570 Actinic keratosis: Secondary | ICD-10-CM | POA: Diagnosis not present

## 2017-09-17 ENCOUNTER — Encounter: Payer: Self-pay | Admitting: Family Medicine

## 2017-10-20 NOTE — Progress Notes (Signed)
[name] [place], [place] [address] Phone: [phone] Subjective:     CC: Low back pain  SMO:[id number]  Emily Anderson is a 57 y.o. female coming in with complaint of low back pain.  Patient has had this for quite some time.  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  Has been doing relatively well with conservative therapy.  Patient denies any radiation down the legs or any numbness or tingling.    Past Medical History:  Diagnosis Date  . Allergy   . Dyspepsia    food triggers  . GERD (gastroesophageal reflux disease)   . Heart murmur   . Hyperlipidemia   . Hypertension   . Hypothyroidism    Past Surgical History:  Procedure Laterality Date  . ABLATION    . CESAREAN SECTION     G 2 P 2, both C sections  . COLONOSCOPY  2009   negative; [facility]  . LAPAROSCOPIC VAGINAL HYSTERECTOMY WITH SALPINGECTOMY Bilateral 08/02/2015   Procedure: LAPAROSCOPIC ASSISTED VAGINAL HYSTERECTOMY WITH SALPINGECTOMY;  Surgeon: Paula Compton, MD;  Location: [facility];  Service: Gynecology;  Laterality: Bilateral;   Social History   Socioeconomic History  . Marital status: Married    Spouse name: Not on file  . Number of children: Not on file  . Years of education: Not on file  . Highest education level: Not on file  Occupational History  . 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 Financial resource strain: Not on file  . Food insecurity:    Worry: Not on file    Inability: Not on file  . Transportation needs:    Medical: Not on file    Non-medical: Not on file  Tobacco Use  . Smoking status: Never Smoker  . Smokeless tobacco: Never Used  Substance and Sexual Activity  . Alcohol use: No  . Drug use: No  . Sexual activity: Yes  Lifestyle  . Physical activity:    Days per week: Not on file    Minutes per session: Not on file  . Stress: Not on file    Relationships  . Social connections:    Talks on phone: Not on file    Gets together: Not on file    Attends religious service: Not on file    Active member of club or organization: Not on file    Attends meetings of clubs or organizations: Not on file    Relationship status: Not on file  Other Topics Concern  . Not on file  Social History Narrative   REG EXERCISE..   LOW CARB LOW NA DIET   Allergies  Allergen Reactions  . Amoxicillin Rash    Rash Because of a history of documented adverse serious drug reaction;Medi Alert braceletis recommended Has patient had a PCN reaction causing immediate rash, facial/tongue/throat swelling, SOB or lightheadedness with hypotension: No Has patient had a PCN reaction causing severe rash involving mucus membranes or skin necrosis: No Has patient had a PCN reaction that required hospitalization No Has patient had a PCN reaction occurring within the last 10 years: No If all of the above answers are "N Rash Because of a history of documented adverse serious drug reaction;Medi Alert bracelet  is recommended Has patient had a PCN reaction causing immediate rash, facial/tongue/throat swelling, SOB or lightheadedness with hypotension: No Has patient had a PCN reaction causing severe  rash involving mucus membranes or skin necrosis: No Has patient had a PCN reaction that required hospitalization No Has patient had a PCN reaction occurring within the last 10 years: No If all of the above answers are "N  . Hctz [Hydrochlorothiazide] Other (See Comments)    03/2012 K+ 3.3 03/2012 K+ 3.3  . Sulfamethoxazole-Trimethoprim Rash    H/a's, heart racing & flu symptoms   Family History  Problem Relation Age of Onset  . Thyroid cancer Mother   . Prostate cancer Father   . Heart disease Father 45       angioplasty   . Colon polyps Father   . Diabetes Father   . Other Father 39       carcinoid tumor   . Colon cancer Father   . Diabetes Paternal Aunt         1  . Diabetes Paternal Uncle         2  . Colon polyps Sister   . Muscular dystrophy Sister   . Stroke Neg Hx   . Esophageal cancer Neg Hx   . Liver cancer Neg Hx   . Pancreatic cancer Neg Hx   . Rectal cancer Neg Hx   . Stomach cancer Neg Hx      Past medical history, social, surgical and family history all reviewed in electronic medical record.  No pertanent information unless stated regarding to the chief complaint.   Review of Systems:Review of systems updated and as accurate as of 10/22/17  No headache, visual changes, nausea, vomiting, diarrhea, constipation, dizziness, abdominal pain, skin rash, fevers, chills, night sweats, weight loss, swollen lymph nodes, body aches, joint swelling,  chest pain, shortness of breath, mood changes.  Mild positive muscle aches  Objective  Blood pressure 112/70, pulse 67, height 5\' 8"  (1.727 m), weight 175 lb (79.4 kg), last menstrual period 07/19/2015, SpO2 97 %. Systems examined below as of 10/22/17   General: No apparent distress alert and oriented x3 mood and affect normal, dressed appropriately.  HEENT: Pupils equal, extraocular movements intact  Respiratory: Patient's speak in full sentences and does not appear short of breath  Cardiovascular: No lower extremity edema, non tender, no erythema  Skin: Warm dry intact with no signs of infection or rash on extremities or on axial skeleton.  Abdomen: Soft nontender  Neuro: Cranial nerves II through XII are intact, neurovascularly intact in all extremities with 2+ DTRs and 2+ pulses.  Lymph: No lymphadenopathy of posterior or anterior cervical chain or axillae bilaterally.  Gait normal with good balance and coordination.  MSK:  Non tender with full range of motion and good stability and symmetric strength and tone of shoulders, elbows, wrist, hip, knee and ankles bilaterally.  Back Exam:  Inspection: Loss of lordosis Motion: Flexion 30 deg, Extension 25 deg, Side Bending to 35 deg  bilaterally,  Rotation to 30 deg bilaterally  SLR laying: Negative  XSLR laying: Negative  Palpable tenderness: Tender to palpation the paraspinal musculature lumbar spine right greater than left. FABER: Tightness bilaterally. Sensory change: Gross sensation intact to all lumbar and sacral dermatomes.  Reflexes: 2+ at both patellar tendons, 2+ at achilles tendons, Babinski's downgoing.  Strength at foot  Plantar-flexion: 5/5 Dorsi-flexion: 5/5 Eversion: 5/5 Inversion: 5/5  Leg strength  Quad: 5/5 Hamstring: 5/5 Hip flexor: 5/5 Hip abductors: 4/5 but symmetric Gait unremarkable.  Osteopathic findings  T7 extended rotated and side bent left L2 flexed rotated and side bent right Sacrum right on right  Impression and Recommendations:     This case required medical decision making of moderate complexity.      Note: This dictation was prepared with Dragon dictation along with smaller phrase technology. Any transcriptional errors that result from this process are unintentional.

## 2017-10-22 ENCOUNTER — Encounter: Payer: Self-pay | Admitting: Family Medicine

## 2017-10-22 ENCOUNTER — Ambulatory Visit: Payer: 59 | Admitting: Family Medicine

## 2017-10-22 VITALS — BP 112/70 | HR 67 | Ht 68.0 in | Wt 175.0 lb

## 2017-10-22 DIAGNOSIS — M5136 Other intervertebral disc degeneration, lumbar region: Secondary | ICD-10-CM | POA: Diagnosis not present

## 2017-10-22 DIAGNOSIS — M999 Biomechanical lesion, unspecified: Secondary | ICD-10-CM | POA: Diagnosis not present

## 2017-10-22 DIAGNOSIS — M51369 Other intervertebral disc degeneration, lumbar region without mention of lumbar back pain or lower extremity pain: Secondary | ICD-10-CM

## 2017-10-22 NOTE — Assessment & Plan Note (Signed)
Patient responded fairly well to osteopathic manipulation.  Encourage patient to monitor room injections, we discussed posture and ergonomics.  Follow-up again in 4 to 8 weeks

## 2017-10-22 NOTE — Assessment & Plan Note (Signed)
Decision today to treat with OMT was based on Physical Exam  After verbal consent patient was treated with HVLA, ME, FPR techniques in  thoracic, lumbar and sacral areas  Patient tolerated the procedure well with improvement in symptoms  Patient given exercises, stretches and lifestyle modifications  See medications in patient instructions if given  Patient will follow up in 4-8 weeks 

## 2017-10-22 NOTE — Patient Instructions (Signed)
Good to see you  Emily Anderson is your friend.  Stay active.  Get back to some machine weights See me again in 5-6 weeks

## 2017-11-24 NOTE — Progress Notes (Signed)
[name] [place], [place] [address] Phone: [phone] Subjective:     CC: Back pain and hip pain follow-up  [id number]  Emily Anderson is a 57 y.o. female coming in with complaint of hip pain. States that she is more sore than last time. Has started working in the gym again.  Noticed significant more pain when patient was attempting to do back exercises though.  Patient states that it seems to radiate even down the leg somewhat.  No weakness.  Known moderate arthritic changes of the back especially at L2-L3.      Past Medical History:  Diagnosis Date  . Allergy   . Dyspepsia    food triggers  . GERD (gastroesophageal reflux disease)   . Heart murmur   . Hyperlipidemia   . Hypertension   . Hypothyroidism    Past Surgical History:  Procedure Laterality Date  . ABLATION    . CESAREAN SECTION     G 2 P 2, both C sections  . COLONOSCOPY  2009   negative; [facility]  . LAPAROSCOPIC VAGINAL HYSTERECTOMY WITH SALPINGECTOMY Bilateral 08/02/2015   Procedure: LAPAROSCOPIC ASSISTED VAGINAL HYSTERECTOMY WITH SALPINGECTOMY;  Surgeon: Paula Compton, MD;  Location: [facility];  Service: Gynecology;  Laterality: Bilateral;   Social History   Socioeconomic History  . Marital status: Married    Spouse name: Not on file  . Number of children: Not on file  . Years of education: Not on file  . Highest education level: Not on file  Occupational History  . 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 Financial resource strain: Not on file  . Food insecurity:    Worry: Not on file    Inability: Not on file  . Transportation needs:    Medical: Not on file    Non-medical: Not on file  Tobacco Use  . Smoking status: Never Smoker  . Smokeless tobacco: Never Used  Substance and Sexual Activity  . Alcohol use: No  . Drug use: No  . Sexual activity: Yes  Lifestyle  . Physical activity:    Days per  week: Not on file    Minutes per session: Not on file  . Stress: Not on file  Relationships  . Social connections:    Talks on phone: Not on file    Gets together: Not on file    Attends religious service: Not on file    Active member of club or organization: Not on file    Attends meetings of clubs or organizations: Not on file    Relationship status: Not on file  Other Topics Concern  . Not on file  Social History Narrative   REG EXERCISE..   LOW CARB LOW NA DIET   Allergies  Allergen Reactions  . Amoxicillin Rash    Rash Because of a history of documented adverse serious drug reaction;Medi Alert braceletis recommended Has patient had a PCN reaction causing immediate rash, facial/tongue/throat swelling, SOB or lightheadedness with hypotension: No Has patient had a PCN reaction causing severe rash involving mucus membranes or skin necrosis: No Has patient had a PCN reaction that required hospitalization No Has patient had a PCN reaction occurring within the last 10 years: No If all of the above answers are "N Rash Because of a history of documented adverse serious drug reaction;Medi Alert bracelet  is recommended Has patient had a PCN reaction causing immediate rash, facial/tongue/throat  swelling, SOB or lightheadedness with hypotension: No Has patient had a PCN reaction causing severe rash involving mucus membranes or skin necrosis: No Has patient had a PCN reaction that required hospitalization No Has patient had a PCN reaction occurring within the last 10 years: No If all of the above answers are "N  . Hctz [Hydrochlorothiazide] Other (See Comments)    03/2012 K+ 3.3 03/2012 K+ 3.3  . Sulfamethoxazole-Trimethoprim Rash    H/a's, heart racing & flu symptoms   Family History  Problem Relation Age of Onset  . Thyroid cancer Mother   . Prostate cancer Father   . Heart disease Father 49       angioplasty   . Colon polyps Father   . Diabetes Father   . Other Father 60         carcinoid tumor   . Colon cancer Father   . Diabetes Paternal Aunt        1  . Diabetes Paternal Uncle         2  . Colon polyps Sister   . Muscular dystrophy Sister   . Stroke Neg Hx   . Esophageal cancer Neg Hx   . Liver cancer Neg Hx   . Pancreatic cancer Neg Hx   . Rectal cancer Neg Hx   . Stomach cancer Neg Hx      Past medical history, social, surgical and family history all reviewed in electronic medical record.  No pertanent information unless stated regarding to the chief complaint.   Review of Systems:Review of systems updated and as accurate as of 11/26/17  No headache, visual changes, nausea, vomiting, diarrhea, constipation, dizziness, abdominal pain, skin rash, fevers, chills, night sweats, weight loss, swollen lymph nodes, body aches, joint swelling, muscle aches, chest pain, shortness of breath, mood changes.   Objective  Blood pressure 100/64, pulse 63, height 5\' 8"  (1.727 m), weight 178 lb (80.7 kg), last menstrual period 07/19/2015, SpO2 96 %. Systems examined below as of 11/26/17   General: No apparent distress alert and oriented x3 mood and affect normal, dressed appropriately.  HEENT: Pupils equal, extraocular movements intact  Respiratory: Patient's speak in full sentences and does not appear short of breath  Cardiovascular: No lower extremity edema, non tender, no erythema  Skin: Warm dry intact with no signs of infection or rash on extremities or on axial skeleton.  Abdomen: Soft nontender  Neuro: Cranial nerves II through XII are intact, neurovascularly intact in all extremities with 2+ DTRs and 2+ pulses.  Lymph: No lymphadenopathy of posterior or anterior cervical chain or axillae bilaterally.  Gait normal with good balance and coordination.  MSK:  Non tender with full range of motion and good stability and symmetric strength and tone of shoulders, elbows, wrist,  knee and ankles bilaterally.  Back Exam:  Inspection: Loss of lordosis Motion:  Flexion 40 deg, Extension 25 deg, Side Bending to 30 deg bilaterally,  Rotation to 35 deg bilaterally  SLR laying: Negative  XSLR laying: Negative  Palpable tenderness: Tender to palpation of the paraspinal musculature lumbar spine left greater than right. FABER: Positive Faber on the left. Sensory change: Gross sensation intact to all lumbar and sacral dermatomes.  Reflexes: 2+ at both patellar tendons, 2+ at achilles tendons, Babinski's downgoing.  Strength at foot  Plantar-flexion: 5/5 Dorsi-flexion: 5/5 Eversion: 5/5 Inversion: 5/5  Leg strength  Quad: 5/5 Hamstring: 5/5 Hip flexor: 5/5 Hip abductors: 4/5    Osteopathic findings  T3 extended rotated and side  bent right inhaled third rib T9 extended rotated and side bent left L2 flexed rotated and side bent right Sacrum right on right    Impression and Recommendations:     This case required medical decision making of moderate complexity.      Note: This dictation was prepared with Dragon dictation along with smaller phrase technology. Any transcriptional errors that result from this process are unintentional.

## 2017-11-26 ENCOUNTER — Encounter: Payer: Self-pay | Admitting: Family Medicine

## 2017-11-26 ENCOUNTER — Ambulatory Visit: Payer: 59 | Admitting: Family Medicine

## 2017-11-26 VITALS — BP 100/64 | HR 63 | Ht 68.0 in | Wt 178.0 lb

## 2017-11-26 DIAGNOSIS — M5136 Other intervertebral disc degeneration, lumbar region: Secondary | ICD-10-CM | POA: Diagnosis not present

## 2017-11-26 DIAGNOSIS — M999 Biomechanical lesion, unspecified: Secondary | ICD-10-CM

## 2017-11-26 NOTE — Assessment & Plan Note (Signed)
Moderate.  I do believe lumbar radiculopathy giving patient unfortunately bilateral hip pain.  We discussed different injection necessary.  We discussed advanced imaging which patient has declined.  Attempted osteopathic manipulation again today.  Patient will follow-up with me again in 4 to 8 weeks

## 2017-11-26 NOTE — Assessment & Plan Note (Signed)
Decision today to treat with OMT was based on Physical Exam  After verbal consent patient was treated with HVLA, ME, FPR techniques in  thoracic, lumbar and sacral areas  Patient tolerated the procedure well with improvement in symptoms  Patient given exercises, stretches and lifestyle modifications  See medications in patient instructions if given  Patient will follow up in 4-8 weeks 

## 2017-11-26 NOTE — Patient Instructions (Signed)
Good to see you  Ice 20 minutes 2 times daily. Usually after activity and before bed. Stay active  No flexion or extension of the back  See me again in 4 weeks

## 2017-11-29 DIAGNOSIS — M21611 Bunion of right foot: Secondary | ICD-10-CM | POA: Diagnosis not present

## 2017-11-29 DIAGNOSIS — R200 Anesthesia of skin: Secondary | ICD-10-CM | POA: Diagnosis not present

## 2018-01-03 ENCOUNTER — Ambulatory Visit: Payer: 59 | Admitting: Family Medicine

## 2018-03-11 DIAGNOSIS — H04123 Dry eye syndrome of bilateral lacrimal glands: Secondary | ICD-10-CM | POA: Diagnosis not present

## 2018-03-11 DIAGNOSIS — H2513 Age-related nuclear cataract, bilateral: Secondary | ICD-10-CM | POA: Diagnosis not present

## 2018-03-11 DIAGNOSIS — H16223 Keratoconjunctivitis sicca, not specified as Sjogren's, bilateral: Secondary | ICD-10-CM | POA: Diagnosis not present

## 2018-03-25 ENCOUNTER — Ambulatory Visit: Payer: 59 | Admitting: Internal Medicine

## 2018-03-25 ENCOUNTER — Ambulatory Visit: Payer: Self-pay | Admitting: *Deleted

## 2018-03-25 ENCOUNTER — Encounter: Payer: Self-pay | Admitting: Internal Medicine

## 2018-03-25 ENCOUNTER — Other Ambulatory Visit (INDEPENDENT_AMBULATORY_CARE_PROVIDER_SITE_OTHER): Payer: 59

## 2018-03-25 ENCOUNTER — Ambulatory Visit (INDEPENDENT_AMBULATORY_CARE_PROVIDER_SITE_OTHER)
Admission: RE | Admit: 2018-03-25 | Discharge: 2018-03-25 | Disposition: A | Discharge status: Home / Self Care | Payer: 59 | Source: Ambulatory Visit | Attending: Internal Medicine | Admitting: Internal Medicine

## 2018-03-25 VITALS — BP 118/82 | HR 60 | Temp 98.4°F | Resp 16 | Ht 68.0 in | Wt 177.0 lb

## 2018-03-25 DIAGNOSIS — R002 Palpitations: Secondary | ICD-10-CM | POA: Diagnosis not present

## 2018-03-25 DIAGNOSIS — Z833 Family history of diabetes mellitus: Secondary | ICD-10-CM | POA: Insufficient documentation

## 2018-03-25 DIAGNOSIS — R079 Chest pain, unspecified: Secondary | ICD-10-CM

## 2018-03-25 DIAGNOSIS — R0602 Shortness of breath: Secondary | ICD-10-CM | POA: Diagnosis not present

## 2018-03-25 LAB — CBC WITH DIFFERENTIAL/PLATELET
BASOS ABS: 0.1 10*3/uL (ref 0.0–0.1)
Basophils Relative: 1.2 % (ref 0.0–3.0)
EOS ABS: 0.4 10*3/uL (ref 0.0–0.7)
Eosinophils Relative: 5.5 % — ABNORMAL HIGH (ref 0.0–5.0)
HEMATOCRIT: 41.6 % (ref 36.0–46.0)
Hemoglobin: 14.2 g/dL (ref 12.0–15.0)
LYMPHS PCT: 35 % (ref 12.0–46.0)
Lymphs Abs: 2.3 10*3/uL (ref 0.7–4.0)
MCHC: 34.2 g/dL (ref 30.0–36.0)
MCV: 95.8 fl (ref 78.0–100.0)
Monocytes Absolute: 0.5 10*3/uL (ref 0.1–1.0)
Monocytes Relative: 7.5 % (ref 3.0–12.0)
NEUTROS ABS: 3.3 10*3/uL (ref 1.4–7.7)
NEUTROS PCT: 50.8 % (ref 43.0–77.0)
PLATELETS: 248 10*3/uL (ref 150.0–400.0)
RBC: 4.34 Mil/uL (ref 3.87–5.11)
RDW: 12.6 % (ref 11.5–15.5)
WBC: 6.6 10*3/uL (ref 4.0–10.5)

## 2018-03-25 LAB — COMPREHENSIVE METABOLIC PANEL
ALBUMIN: 4.3 g/dL (ref 3.5–5.2)
ALT: 18 U/L (ref 0–35)
AST: 17 U/L (ref 0–37)
Alkaline Phosphatase: 58 U/L (ref 39–117)
BUN: 12 mg/dL (ref 6–23)
CALCIUM: 9.8 mg/dL (ref 8.4–10.5)
CO2: 28 mEq/L (ref 19–32)
CREATININE: 0.73 mg/dL (ref 0.40–1.20)
Chloride: 102 mEq/L (ref 96–112)
GFR: 87.36 mL/min (ref >60.00)
Glucose, Bld: 100 mg/dL — ABNORMAL HIGH (ref 70–99)
Potassium: 4.2 mEq/L (ref 3.5–5.1)
Sodium: 138 mEq/L (ref 135–145)
TOTAL PROTEIN: 7.4 g/dL (ref 6.0–8.3)
Total Bilirubin: 0.4 mg/dL (ref 0.2–1.2)

## 2018-03-25 LAB — TSH: TSH: 4.72 u[IU]/mL — ABNORMAL HIGH (ref 0.35–4.50)

## 2018-03-25 LAB — HEMOGLOBIN A1C: Hgb A1c MFr Bld: 5.4 % (ref 4.6–6.5)

## 2018-03-25 NOTE — Telephone Encounter (Signed)
Seeing you at 8:45

## 2018-03-25 NOTE — Progress Notes (Signed)
Subjective:    Patient ID: Emily Anderson, female    DOB: April 12, 1961, 57 y.o.   MRN: [id number]  HPI The patient is here for an acute visit for chest heaviness.   Several weeks ago she was in bed and sleeping on her stomach with her arms up.  Her arms would be tingling and it was a strong sensation that would persist no matter what position she was then.  She continued to experience this for 5-6 days and then it stopped and she has not had it since.    Dullness in left upper chest: She has had a dull heaviness or discomfort in her upper left chest that is been constant for the past 1-2 weeks.  Her pain with this chest pain is 3/10.  Intermittently she will get a more prevalent sharp pain that will last 5-10 minutes.  She has been experiencing palpitations.  She denies shortness of breath, except for once recently when going up stairs.  She is exercising regularly-every morning she does 30 minutes of fast walking on the treadmill and 2-3 times a week she does 45 minutes on the treadmill.  She does not have any symptoms while exercising.  Has a stressful job.  This monring she had palpaitions and she has had intermittent palpitations for the past 4-5 days.   Her resting pulse is typically 60-63 and it can be up to 74-75 at times - she feels her heart is beating heavy.   Over the weekend her husband noticed that her breath smelt different.   She has changed her tooth paste.  He said it smelled like nail polish remover - like acetone.  She has been drinking more water, change her toothpaste back and her husband said not as bad.   She does state some increased burping.  She denies reflux.  She is not taking any over-the-counter medications.  She has decreased her caffeine intake since her symptoms started and there has been no change.  She is not drinking much caffeine to begin with.  No change in meds.    Medications and allergies reviewed with patient and updated if appropriate.  Patient  Active Problem List   Diagnosis Date Noted  . Degenerative disc disease, lumbar 09/10/2017  . Nonallopathic lesion of lumbosacral region 06/29/2017  . Nonallopathic lesion of sacral region 06/29/2017  . Nonallopathic lesion of thoracic region 06/29/2017  . Greater trochanteric bursitis of both hips 05/28/2017  . Family history of muscular dystrophy 05/11/2017  . Pain of both hip joints 05/11/2017  . Arthralgia 05/11/2017  . Constipation 04/03/2016  . S/P laparoscopic assisted vaginal hysterectomy (LAVH) 08/02/2015  . Hypothyroidism 02/13/2015  . Greater trochanteric bursitis of right hip 05/19/2014  . Essential hypertension 07/19/2009  . Hyperlipidemia 08/01/2007  . DYSPEPSIA 04/04/2007    Current Outpatient Medications on File Prior to Visit  Medication Sig Dispense Refill  . aspirin EC 81 MG tablet Take 81 mg by mouth daily.    . cetirizine (ZYRTEC) 10 MG tablet Take 10 mg by mouth daily.    . Multiple Vitamins-Minerals (CENTRUM SILVER PO) Take 1 tablet by mouth daily.    . Omega-3 Fatty Acids (FISH OIL PO) Take 1,200 mg by mouth daily.    Marland Kitchen OVER THE COUNTER MEDICATION Vitamin D 3 1000 units daily.    Marland Kitchen OVER THE COUNTER MEDICATION Tumeric Curcumin 500 mg. One capsule daily.    Marland Kitchen OVER THE COUNTER MEDICATION Tart Cherry Extract, 1200 mg one tablet daily.    Marland Kitchen  spironolactone (ALDACTONE) 25 MG tablet Take 25 mg by mouth daily.    Marland Kitchen spironolactone (ALDACTONE) 25 MG tablet Take 1 tablet (25 mg total) by mouth once for 1 dose. --- Office visit needed for further refills 90 tablet 3   Current Facility-Administered Medications on File Prior to Visit  Medication Dose Route Frequency Provider Last Rate Last Dose  . 0.9 %  sodium chloride infusion  500 mL Intravenous Continuous Milus Banister, MD        Past Medical History:  Diagnosis Date  . Allergy   . Dyspepsia    food triggers  . GERD (gastroesophageal reflux disease)   . Heart murmur   . Hyperlipidemia   . Hypertension   .  Hypothyroidism     Past Surgical History:  Procedure Laterality Date  . ABLATION    . CESAREAN SECTION     G 2 P 2, both C sections  . COLONOSCOPY  2009   negative;  GI  . LAPAROSCOPIC VAGINAL HYSTERECTOMY WITH SALPINGECTOMY Bilateral 08/02/2015   Procedure: LAPAROSCOPIC ASSISTED VAGINAL HYSTERECTOMY WITH SALPINGECTOMY;  Surgeon: Paula Compton, MD;  Location: [facility];  Service: Gynecology;  Laterality: Bilateral;    Social History   Socioeconomic History  . Marital status: Married    Spouse name: Not on file  . Number of children: Not on file  . Years of education: Not on file  . Highest education level: Not on file  Occupational History  . 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 Financial resource strain: Not on file  . Food insecurity:    Worry: Not on file    Inability: Not on file  . Transportation needs:    Medical: Not on file    Non-medical: Not on file  Tobacco Use  . Smoking status: Never Smoker  . Smokeless tobacco: Never Used  Substance and Sexual Activity  . Alcohol use: No  . Drug use: No  . Sexual activity: Yes  Lifestyle  . Physical activity:    Days per week: Not on file    Minutes per session: Not on file  . Stress: Not on file  Relationships  . Social connections:    Talks on phone: Not on file    Gets together: Not on file    Attends religious service: Not on file    Active member of club or organization: Not on file    Attends meetings of clubs or organizations: Not on file    Relationship status: Not on file  Other Topics Concern  . Not on file  Social History Narrative   REG EXERCISE..   LOW CARB LOW NA DIET    Family History  Problem Relation Age of Onset  . Thyroid cancer Mother   . Prostate cancer Father   . Heart disease Father 40       angioplasty   . Colon polyps Father   . Diabetes Father   . Other Father 28       carcinoid tumor   . Colon cancer Father   . Diabetes  Paternal Aunt        1  . Diabetes Paternal Uncle         2  . Colon polyps Sister   . Muscular dystrophy Sister   . Stroke Neg Hx   . Esophageal cancer Neg Hx   . Liver cancer Neg Hx   . Pancreatic cancer Neg Hx   .  Rectal cancer Neg Hx   . Stomach cancer Neg Hx     Review of Systems  Constitutional: Negative for chills and fever.  Respiratory: Positive for shortness of breath (a little at times - going up stairs). Negative for cough and wheezing.   Cardiovascular: Positive for chest pain and palpitations. Negative for leg swelling.  Gastrointestinal: Negative for nausea.  Neurological: Positive for light-headedness (with palpitations) and headaches (dull headaches).       Objective:   Vitals:   03/25/18 0839  BP: 118/82  Pulse: 60  Resp: 16  Temp: 98.4 F (36.9 C)  SpO2: 99%   BP Readings from Last 3 Encounters:  03/25/18 118/82  11/26/17 100/64  10/22/17 112/70   Wt Readings from Last 3 Encounters:  03/25/18 177 lb (80.3 kg)  11/26/17 178 lb (80.7 kg)  10/22/17 175 lb (79.4 kg)   Body mass index is 26.91 kg/m.   Physical Exam  Constitutional: She appears well-developed and well-nourished. No distress.  HENT:  Head: Normocephalic and atraumatic.  Neck: Neck supple. No tracheal deviation present. No thyromegaly present.  Cardiovascular: Normal rate, regular rhythm and normal pulses.  No murmur heard. Pulmonary/Chest: Effort normal and breath sounds normal. No respiratory distress. She has no wheezes. She has no rales.  Musculoskeletal:       Right lower leg: She exhibits no edema.       Left lower leg: She exhibits no edema.  No left upper chest pain with palpation  Lymphadenopathy:    She has no cervical adenopathy.  Skin: Skin is warm and dry.  Psychiatric: Her mood appears anxious (Mildly anxious about her symptoms).         Assessment & Plan:    See Problem List for Assessment and Plan of chronic medical problems.

## 2018-03-25 NOTE — Assessment & Plan Note (Signed)
Chest pain is atypical Has had a constant dull pain in her left upper chest for 1-2 weeks and then occasional stronger chest pain that is transient EKG here today with mild sinus arrhythmia, bradycardic at 57, no acute changes.  Only change from prior is a mild sinus arrhythmia Pain may be stress related-she is under increased stress Will check CBC, CMP, A1c and TSH, chest x-ray Will order an exercise tolerance test to make sure this is not cardiac in nature, but she is exercising regularly on the treadmill without symptoms Discussed stress management Continue regular exercise She will let me know if her symptoms worsen prior to having the exercise tolerance test done

## 2018-03-25 NOTE — Assessment & Plan Note (Signed)
Intermittent palpitations-maximum heart rate seen on Fitbit was 75 No excessive caffeine, no over-the-counter medications Likely related to stress Will check CBC, CMP and TSH

## 2018-03-25 NOTE — Patient Instructions (Addendum)
Have blood work and a chest xray today.   An EKG was done today.    A exercise tolerance test was ordered.   Revise your supplements and consider trying pepcid daily.

## 2018-03-25 NOTE — Telephone Encounter (Addendum)
Pt called with chest heaviness and heart racing even without exertion since 03/22/18, and arm numbness/tinigling for a couple of weeks  Starting about 03/01/18; last night her husband said that her breath smells finger nail polish; she is most concerned with the heaviness in her chest; the pt reports that when she is laying down the numbness is in both arms, but when she is up it is in her left arm; recommendations made per nurse triage protocol; pt offered and accepted appointment with Dr Billey Gosling, [facility], 03/25/18 at [facility]; she verbalized understanding; will route to office for notification.   Reason for Disposition . [1] Chest pain lasts > 5 minutes AND [2] occurred > 3 days ago (72 hours) AND [3] NO chest pain or cardiac symptoms now  Answer Assessment - Initial Assessment Questions 1. LOCATION: "Where does it hurt?"       Chest heaviness in upper left chest (feels like a muscle pulled) 2. RADIATION: "Does the pain go anywhere else?" (e.g., into neck, jaw, arms, back)     Some into neck 3. ONSET: "When did the chest pain begin?" (Minutes, hours or days)      weeks 4. PATTERN "Does the pain come and go, or has it been constant since it started?"  "Does it get worse with exertion?"      Constant; worse with exertion 5. DURATION: "How long does it last" (e.g., seconds, minutes, hours)     hours 6. SEVERITY: "How bad is the pain?"  (e.g., Scale 1-10; mild, moderate, or severe)    - MILD (1-3): doesn't interfere with normal activities     - MODERATE (4-7): interferes with normal activities or awakens from sleep    - SEVERE (8-10): excruciating pain, unable to do any normal activities       moderate 7. CARDIAC RISK FACTORS: "Do you have any history of heart problems or risk factors for heart disease?" (e.g., prior heart attack, angina; high blood pressure, diabetes, being overweight, high cholesterol, smoking, or strong family history of heart disease)     High blood pressure, high cholesterol,  family history of diabetes and heart disease 8. PULMONARY RISK FACTORS: "Do you have any history of lung disease?"  (e.g., blood clots in lung, asthma, emphysema, birth control pills)     No 9. CAUSE: "What do you think is causing the chest pain?"     "anxiety and stress" 10. OTHER SYMPTOMS: "Do you have any other symptoms?" (e.g., dizziness, nausea, vomiting, sweating, fever, difficulty breathing, cough)       Heart racing, numbness in both arms, breath smells like fingernail polish  11. PREGNANCY: "Is there any chance you are pregnant?" "When was your last menstrual period?"       No hysterectomy  Protocols used: CHEST PAIN-A-AH

## 2018-03-25 NOTE — Assessment & Plan Note (Signed)
Family history of diabetes and mild hyperglycemia in the past She is concerned that her change of breath may be related to elevated sugars, this is unlikely Revised supplements Make sure GERD is controlled Will check A1c

## 2018-04-05 DIAGNOSIS — Z130 Encounter for screening for diseases of the blood and blood-forming organs and certain disorders involving the immune mechanism: Secondary | ICD-10-CM | POA: Diagnosis not present

## 2018-04-05 DIAGNOSIS — Z01419 Encounter for gynecological examination (general) (routine) without abnormal findings: Secondary | ICD-10-CM | POA: Diagnosis not present

## 2018-04-05 DIAGNOSIS — Z1231 Encounter for screening mammogram for malignant neoplasm of breast: Secondary | ICD-10-CM | POA: Diagnosis not present

## 2018-04-05 DIAGNOSIS — Z6826 Body mass index (BMI) 26.0-26.9, adult: Secondary | ICD-10-CM | POA: Diagnosis not present

## 2018-04-30 ENCOUNTER — Ambulatory Visit (INDEPENDENT_AMBULATORY_CARE_PROVIDER_SITE_OTHER): Payer: 59

## 2018-04-30 DIAGNOSIS — R079 Chest pain, unspecified: Secondary | ICD-10-CM | POA: Diagnosis not present

## 2018-04-30 LAB — EXERCISE TOLERANCE TEST
CHL CUP MPHR: 163 {beats}/min
CSEPED: 10 min
CSEPEDS: 30 s
CSEPHR: 89 %
CSEPPHR: 146 {beats}/min
Estimated workload: 12.5 METS
RPE: 15
Rest HR: 66 {beats}/min

## 2018-05-26 ENCOUNTER — Other Ambulatory Visit: Payer: Self-pay | Admitting: Internal Medicine

## 2018-06-23 ENCOUNTER — Other Ambulatory Visit: Payer: Self-pay | Admitting: Internal Medicine

## 2018-07-09 DIAGNOSIS — R7303 Prediabetes: Secondary | ICD-10-CM | POA: Insufficient documentation

## 2018-07-09 DIAGNOSIS — R739 Hyperglycemia, unspecified: Secondary | ICD-10-CM | POA: Insufficient documentation

## 2018-07-09 NOTE — Patient Instructions (Addendum)
Tests ordered today. Your results will be released to MyChart (or called to you) after review, usually within 72hours after test completion. If any changes need to be made, you will be notified at that same time.  All other Health Maintenance issues reviewed.   All recommended immunizations and age-appropriate screenings are up-to-date or discussed.  No immunizations administered today.   Medications reviewed and updated.  Changes include :   none  Your prescription(s) have been submitted to your pharmacy. Please take as directed and contact our office if you believe you are having problem(s) with the medication(s).   Please followup in one year   Health Maintenance, Female Adopting a healthy lifestyle and getting preventive care can go a long way to promote health and wellness. Talk with your health care provider about what schedule of regular examinations is right for you. This is a good chance for you to check in with your provider about disease prevention and staying healthy. In between checkups, there are plenty of things you can do on your own. Experts have done a lot of research about which lifestyle changes and preventive measures are most likely to keep you healthy. Ask your health care provider for more information. Weight and diet Eat a healthy diet  Be sure to include plenty of vegetables, fruits, low-fat dairy products, and lean protein.  Do not eat a lot of foods high in solid fats, added sugars, or salt.  Get regular exercise. This is one of the most important things you can do for your health. ? Most adults should exercise for at least 150 minutes each week. The exercise should increase your heart rate and make you sweat (moderate-intensity exercise). ? Most adults should also do strengthening exercises at least twice a week. This is in addition to the moderate-intensity exercise. Maintain a healthy weight  Body mass index (BMI) is a measurement that can be used to  identify possible weight problems. It estimates body fat based on height and weight. Your health care provider can help determine your BMI and help you achieve or maintain a healthy weight.  For females 20 years of age and older: ? A BMI below 18.5 is considered underweight. ? A BMI of 18.5 to 24.9 is normal. ? A BMI of 25 to 29.9 is considered overweight. ? A BMI of 30 and above is considered obese. Watch levels of cholesterol and blood lipids  You should start having your blood tested for lipids and cholesterol at 58 years of age, then have this test every 5 years.  You may need to have your cholesterol levels checked more often if: ? Your lipid or cholesterol levels are high. ? You are older than 58 years of age. ? You are at high risk for heart disease. Cancer screening Lung Cancer  Lung cancer screening is recommended for adults 55-80 years old who are at high risk for lung cancer because of a history of smoking.  A yearly low-dose CT scan of the lungs is recommended for people who: ? Currently smoke. ? Have quit within the past 15 years. ? Have at least a 30-pack-year history of smoking. A pack year is smoking an average of one pack of cigarettes a day for 1 year.  Yearly screening should continue until it has been 15 years since you quit.  Yearly screening should stop if you develop a health problem that would prevent you from having lung cancer treatment. Breast Cancer  Practice breast self-awareness. This means understanding how   your breasts normally appear and feel.  It also means doing regular breast self-exams. Let your health care provider know about any changes, no matter how small.  If you are in your 20s or 30s, you should have a clinical breast exam (CBE) by a health care provider every 1-3 years as part of a regular health exam.  If you are 40 or older, have a CBE every year. Also consider having a breast X-ray (mammogram) every year.  If you have a family  history of breast cancer, talk to your health care provider about genetic screening.  If you are at high risk for breast cancer, talk to your health care provider about having an MRI and a mammogram every year.  Breast cancer gene (BRCA) assessment is recommended for women who have family members with BRCA-related cancers. BRCA-related cancers include: ? Breast. ? Ovarian. ? Tubal. ? Peritoneal cancers.  Results of the assessment will determine the need for genetic counseling and BRCA1 and BRCA2 testing. Cervical Cancer Your health care provider may recommend that you be screened regularly for cancer of the pelvic organs (ovaries, uterus, and vagina). This screening involves a pelvic examination, including checking for microscopic changes to the surface of your cervix (Pap test). You may be encouraged to have this screening done every 3 years, beginning at age 21.  For women ages 30-65, health care providers may recommend pelvic exams and Pap testing every 3 years, or they may recommend the Pap and pelvic exam, combined with testing for human papilloma virus (HPV), every 5 years. Some types of HPV increase your risk of cervical cancer. Testing for HPV may also be done on women of any age with unclear Pap test results.  Other health care providers may not recommend any screening for nonpregnant women who are considered low risk for pelvic cancer and who do not have symptoms. Ask your health care provider if a screening pelvic exam is right for you.  If you have had past treatment for cervical cancer or a condition that could lead to cancer, you need Pap tests and screening for cancer for at least 20 years after your treatment. If Pap tests have been discontinued, your risk factors (such as having a new sexual partner) need to be reassessed to determine if screening should resume. Some women have medical problems that increase the chance of getting cervical cancer. In these cases, your health care  provider may recommend more frequent screening and Pap tests. Colorectal Cancer  This type of cancer can be detected and often prevented.  Routine colorectal cancer screening usually begins at 58 years of age and continues through 58 years of age.  Your health care provider may recommend screening at an earlier age if you have risk factors for colon cancer.  Your health care provider may also recommend using home test kits to check for hidden blood in the stool.  A small camera at the end of a tube can be used to examine your colon directly (sigmoidoscopy or colonoscopy). This is done to check for the earliest forms of colorectal cancer.  Routine screening usually begins at age 50.  Direct examination of the colon should be repeated every 5-10 years through 58 years of age. However, you may need to be screened more often if early forms of precancerous polyps or small growths are found. Skin Cancer  Check your skin from head to toe regularly.  Tell your health care provider about any new moles or changes in moles, especially   if there is a change in a mole's shape or color.  Also tell your health care provider if you have a mole that is larger than the size of a pencil eraser.  Always use sunscreen. Apply sunscreen liberally and repeatedly throughout the day.  Protect yourself by wearing long sleeves, pants, a wide-brimmed hat, and sunglasses whenever you are outside. Heart disease, diabetes, and high blood pressure  High blood pressure causes heart disease and increases the risk of stroke. High blood pressure is more likely to develop in: ? People who have blood pressure in the high end of the normal range (130-139/85-89 mm Hg). ? People who are overweight or obese. ? People who are African American.  If you are 18-39 years of age, have your blood pressure checked every 3-5 years. If you are 40 years of age or older, have your blood pressure checked every year. You should have your  blood pressure measured twice-once when you are at a hospital or clinic, and once when you are not at a hospital or clinic. Record the average of the two measurements. To check your blood pressure when you are not at a hospital or clinic, you can use: ? An automated blood pressure machine at a pharmacy. ? A home blood pressure monitor.  If you are between 55 years and 79 years old, ask your health care provider if you should take aspirin to prevent strokes.  Have regular diabetes screenings. This involves taking a blood sample to check your fasting blood sugar level. ? If you are at a normal weight and have a low risk for diabetes, have this test once every three years after 58 years of age. ? If you are overweight and have a high risk for diabetes, consider being tested at a younger age or more often. Preventing infection Hepatitis B  If you have a higher risk for hepatitis B, you should be screened for this virus. You are considered at high risk for hepatitis B if: ? You were born in a country where hepatitis B is common. Ask your health care provider which countries are considered high risk. ? Your parents were born in a high-risk country, and you have not been immunized against hepatitis B (hepatitis B vaccine). ? You have HIV or AIDS. ? You use needles to inject street drugs. ? You live with someone who has hepatitis B. ? You have had sex with someone who has hepatitis B. ? You get hemodialysis treatment. ? You take certain medicines for conditions, including cancer, organ transplantation, and autoimmune conditions. Hepatitis C  Blood testing is recommended for: ? Everyone born from 1945 through 1965. ? Anyone with known risk factors for hepatitis C. Sexually transmitted infections (STIs)  You should be screened for sexually transmitted infections (STIs) including gonorrhea and chlamydia if: ? You are sexually active and are younger than 58 years of age. ? You are older than 58  years of age and your health care provider tells you that you are at risk for this type of infection. ? Your sexual activity has changed since you were last screened and you are at an increased risk for chlamydia or gonorrhea. Ask your health care provider if you are at risk.  If you do not have HIV, but are at risk, it may be recommended that you take a prescription medicine daily to prevent HIV infection. This is called pre-exposure prophylaxis (PrEP). You are considered at risk if: ? You are sexually active and do not   regularly use condoms or know the HIV status of your partner(s). ? You take drugs by injection. ? You are sexually active with a partner who has HIV. Talk with your health care provider about whether you are at high risk of being infected with HIV. If you choose to begin PrEP, you should first be tested for HIV. You should then be tested every 3 months for as long as you are taking PrEP. Pregnancy  If you are premenopausal and you may become pregnant, ask your health care provider about preconception counseling.  If you may become pregnant, take 400 to 800 micrograms (mcg) of folic acid every day.  If you want to prevent pregnancy, talk to your health care provider about birth control (contraception). Osteoporosis and menopause  Osteoporosis is a disease in which the bones lose minerals and strength with aging. This can result in serious bone fractures. Your risk for osteoporosis can be identified using a bone density scan.  If you are 65 years of age or older, or if you are at risk for osteoporosis and fractures, ask your health care provider if you should be screened.  Ask your health care provider whether you should take a calcium or vitamin D supplement to lower your risk for osteoporosis.  Menopause may have certain physical symptoms and risks.  Hormone replacement therapy may reduce some of these symptoms and risks. Talk to your health care provider about whether  hormone replacement therapy is right for you. Follow these instructions at home:  Schedule regular health, dental, and eye exams.  Stay current with your immunizations.  Do not use any tobacco products including cigarettes, chewing tobacco, or electronic cigarettes.  If you are pregnant, do not drink alcohol.  If you are breastfeeding, limit how much and how often you drink alcohol.  Limit alcohol intake to no more than 1 drink per day for nonpregnant women. One drink equals 12 ounces of beer, 5 ounces of wine, or 1 ounces of hard liquor.  Do not use street drugs.  Do not share needles.  Ask your health care provider for help if you need support or information about quitting drugs.  Tell your health care provider if you often feel depressed.  Tell your health care provider if you have ever been abused or do not feel safe at home. This information is not intended to replace advice given to you by your health care provider. Make sure you discuss any questions you have with your health care provider. Document Released: 10/31/2010 Document Revised: 09/23/2015 Document Reviewed: 01/19/2015 Elsevier Interactive Patient Education  2019 [employer]  

## 2018-07-09 NOTE — Progress Notes (Signed)
Subjective:    Patient ID: Emily Anderson, female    DOB: [birth year], 58 y.o.   MRN: [id number]  HPI She is here for a physical exam.   4 episodes of swallowing and it not going down in the past month.  The first episode was very painful, but the others were less painful.  One day she really thinks she took a big bite of chicken and was rushing to get to work.  She has only felt GERD once.  Every time she had difficulty swallowing she was eating meat and she was not paying attention.  She thinks she just needs to slow down, but wanted to mention it.   She has had a couple of episodes since November of palpitations, but they were not as bad.  She does not think they were stress related.  She does have a high stress job.  She has not had them recently and denies chest pain.   Medications and allergies reviewed with patient and updated if appropriate.  Patient Active Problem List   Diagnosis Date Noted  . Hyperglycemia 07/09/2018  . Chest pain 03/25/2018  . Palpitations 03/25/2018  . Family history of diabetes mellitus (DM) 03/25/2018  . Degenerative disc disease, lumbar 09/10/2017  . Nonallopathic lesion of lumbosacral region 06/29/2017  . Nonallopathic lesion of sacral region 06/29/2017  . Nonallopathic lesion of thoracic region 06/29/2017  . Greater trochanteric bursitis of both hips 05/28/2017  . Family history of muscular dystrophy 05/11/2017  . Pain of both hip joints 05/11/2017  . Arthralgia 05/11/2017  . Constipation 04/03/2016  . S/P laparoscopic assisted vaginal hysterectomy (LAVH) 08/02/2015  . Hypothyroidism 02/13/2015  . Greater trochanteric bursitis of right hip 05/19/2014  . Essential hypertension 07/19/2009  . Hyperlipidemia 08/01/2007  . DYSPEPSIA 04/04/2007    Current Outpatient Medications on File Prior to Visit  Medication Sig Dispense Refill  . aspirin EC 81 MG tablet Take 81 mg by mouth daily.    . cetirizine (ZYRTEC) 10 MG tablet Take 10 mg by mouth  daily.    . Multiple Vitamins-Minerals (CENTRUM SILVER PO) Take 1 tablet by mouth daily.    . Omega-3 Fatty Acids (FISH OIL PO) Take 1,200 mg by mouth daily.    Marland Kitchen OVER THE COUNTER MEDICATION Vitamin D 3 1000 units daily.    Marland Kitchen OVER THE COUNTER MEDICATION Tumeric Curcumin 500 mg. One capsule daily.    Marland Kitchen OVER THE COUNTER MEDICATION Tart Cherry Extract, 1200 mg one tablet daily.    Marland Kitchen spironolactone (ALDACTONE) 25 MG tablet TAKE 1 TABLET BY MOUTH EVERY DAY NEEDS OFFICE VISIT 30 tablet 0   No current facility-administered medications on file prior to visit.     Past Medical History:  Diagnosis Date  . Allergy   . Dyspepsia    food triggers  . GERD (gastroesophageal reflux disease)   . Heart murmur   . Hyperlipidemia   . Hypertension   . Hypothyroidism     Past Surgical History:  Procedure Laterality Date  . ABLATION    . CESAREAN SECTION     G 2 P 2, both C sections  . COLONOSCOPY  2009   negative; [facility]  . LAPAROSCOPIC VAGINAL HYSTERECTOMY WITH SALPINGECTOMY Bilateral 08/02/2015   Procedure: LAPAROSCOPIC ASSISTED VAGINAL HYSTERECTOMY WITH SALPINGECTOMY;  Surgeon: Paula Compton, MD;  Location: [facility];  Service: Gynecology;  Laterality: Bilateral;    Social History   Socioeconomic History  . Marital status: Married    Spouse name:  Not on file  . Number of children: Not on file  . Years of education: Not on file  . Highest education level: Not on file  Occupational History  . 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 Financial resource strain: Not on file  . Food insecurity:    Worry: Not on file    Inability: Not on file  . Transportation needs:    Medical: Not on file    Non-medical: Not on file  Tobacco Use  . Smoking status: Never Smoker  . Smokeless tobacco: Never Used  Substance and Sexual Activity  . Alcohol use: No  . Drug use: No  . Sexual activity: Yes  Lifestyle  . Physical activity:    Days per week: Not  on file    Minutes per session: Not on file  . Stress: Not on file  Relationships  . Social connections:    Talks on phone: Not on file    Gets together: Not on file    Attends religious service: Not on file    Active member of club or organization: Not on file    Attends meetings of clubs or organizations: Not on file    Relationship status: Not on file  Other Topics Concern  . Not on file  Social History Narrative   REG EXERCISE..   LOW CARB LOW NA DIET    Family History  Problem Relation Age of Onset  . Thyroid cancer Mother   . Prostate cancer Father   . Heart disease Father 50       angioplasty   . Colon polyps Father   . Diabetes Father   . Other Father 44       carcinoid tumor   . Colon cancer Father   . Diabetes Paternal Aunt        1  . Diabetes Paternal Uncle         2  . Colon polyps Sister   . Muscular dystrophy Sister   . Stroke Neg Hx   . Esophageal cancer Neg Hx   . Liver cancer Neg Hx   . Pancreatic cancer Neg Hx   . Rectal cancer Neg Hx   . Stomach cancer Neg Hx     Review of Systems  Constitutional: Negative for chills and fever.  HENT: Positive for rhinorrhea.   Eyes: Negative for visual disturbance.  Respiratory: Negative for cough, shortness of breath and wheezing.   Cardiovascular: Positive for palpitations (3 times since November). Negative for chest pain and leg swelling.  Gastrointestinal: Positive for constipation. Negative for abdominal pain, blood in stool, diarrhea and nausea.       One  gerd  Genitourinary: Negative for dysuria and hematuria.  Musculoskeletal: Positive for arthralgias (hips). Negative for back pain.  Skin: Negative for color change and rash.  Neurological: Negative for light-headedness and headaches.  Psychiatric/Behavioral: Negative for dysphoric mood. The patient is not nervous/anxious.        Objective:   Vitals:   07/10/18 0854  BP: 118/80  Pulse: 63  Resp: 16  Temp: 98.5 F (36.9 C)  SpO2: 97%    Filed Weights   07/10/18 0854  Weight: 175 lb 12.8 oz (79.7 kg)   Body mass index is 26.73 kg/m.  BP Readings from Last 3 Encounters:  07/10/18 118/80  03/25/18 118/82  11/26/17 100/64    Wt Readings from Last 3 Encounters:  07/10/18 175 lb 12.8 oz (79.7  kg)  03/25/18 177 lb (80.3 kg)  11/26/17 178 lb (80.7 kg)     Physical Exam Constitutional: She appears well-developed and well-nourished. No distress.  HENT:  Head: Normocephalic and atraumatic.  Right Ear: External ear normal. Normal ear canal and TM Left Ear: External ear normal.  Normal ear canal and TM Mouth/Throat: Oropharynx is clear and moist.  Eyes: Conjunctivae and EOM are normal.  Neck: Neck supple. No tracheal deviation present. No thyromegaly present.  No carotid bruit  Cardiovascular: Normal rate, regular rhythm and normal heart sounds.   No murmur heard.  No edema. Pulmonary/Chest: Effort normal and breath sounds normal. No respiratory distress. She has no wheezes. She has no rales.  Breast: deferred to Gyn Abdominal: Soft. She exhibits no distension. There is no tenderness.  Lymphadenopathy: She has no cervical adenopathy.  Skin: Skin is warm and dry. She is not diaphoretic.  Psychiatric: She has a normal mood and affect. Her behavior is normal.        Assessment & Plan:   Physical exam: Screening blood work  ordered Immunizations  Discussed shingrix, flu done last fall Colonoscopy   Up to date  Mammogram  Up to date Gyn  Up to date  Eye exams  Up to date  EKG   Done 11/219 Exercise  Walking, yoga Weight  Weight ok Skin sees derm annually Substance abuse   none  See Problem List for Assessment and Plan of chronic medical problems.    FU in one year

## 2018-07-10 ENCOUNTER — Other Ambulatory Visit: Payer: Self-pay

## 2018-07-10 ENCOUNTER — Ambulatory Visit (INDEPENDENT_AMBULATORY_CARE_PROVIDER_SITE_OTHER): Payer: 59 | Admitting: Internal Medicine

## 2018-07-10 ENCOUNTER — Other Ambulatory Visit (INDEPENDENT_AMBULATORY_CARE_PROVIDER_SITE_OTHER): Payer: 59

## 2018-07-10 ENCOUNTER — Encounter: Payer: Self-pay | Admitting: Internal Medicine

## 2018-07-10 VITALS — BP 118/80 | HR 63 | Temp 98.5°F | Resp 16 | Ht 68.0 in | Wt 175.8 lb

## 2018-07-10 DIAGNOSIS — E039 Hypothyroidism, unspecified: Secondary | ICD-10-CM

## 2018-07-10 DIAGNOSIS — E038 Other specified hypothyroidism: Secondary | ICD-10-CM

## 2018-07-10 DIAGNOSIS — Z0000 Encounter for general adult medical examination without abnormal findings: Secondary | ICD-10-CM | POA: Diagnosis not present

## 2018-07-10 DIAGNOSIS — R739 Hyperglycemia, unspecified: Secondary | ICD-10-CM | POA: Diagnosis not present

## 2018-07-10 DIAGNOSIS — E7849 Other hyperlipidemia: Secondary | ICD-10-CM | POA: Diagnosis not present

## 2018-07-10 DIAGNOSIS — I10 Essential (primary) hypertension: Secondary | ICD-10-CM | POA: Diagnosis not present

## 2018-07-10 DIAGNOSIS — R1310 Dysphagia, unspecified: Secondary | ICD-10-CM

## 2018-07-10 LAB — COMPREHENSIVE METABOLIC PANEL
ALT: 22 U/L (ref 0–35)
AST: 18 U/L (ref 0–37)
Albumin: 4.5 g/dL (ref 3.5–5.2)
Alkaline Phosphatase: 59 U/L (ref 39–117)
BILIRUBIN TOTAL: 0.5 mg/dL (ref 0.2–1.2)
BUN: 20 mg/dL (ref 6–23)
CO2: 28 mEq/L (ref 19–32)
Calcium: 9.6 mg/dL (ref 8.4–10.5)
Chloride: 101 mEq/L (ref 96–112)
Creatinine, Ser: 0.74 mg/dL (ref 0.40–1.20)
GFR: 80.83 mL/min (ref >60.00)
Glucose, Bld: 89 mg/dL (ref 70–99)
Potassium: 4.2 mEq/L (ref 3.5–5.1)
Sodium: 137 mEq/L (ref 135–145)
Total Protein: 7.3 g/dL (ref 6.0–8.3)

## 2018-07-10 LAB — CBC WITH DIFFERENTIAL/PLATELET
BASOS PCT: 1.1 % (ref 0.0–3.0)
Basophils Absolute: 0.1 10*3/uL (ref 0.0–0.1)
Eosinophils Absolute: 0.3 10*3/uL (ref 0.0–0.7)
Eosinophils Relative: 4.9 % (ref 0.0–5.0)
HCT: 40.4 % (ref 36.0–46.0)
Hemoglobin: 13.9 g/dL (ref 12.0–15.0)
Lymphocytes Relative: 38.5 % (ref 12.0–46.0)
Lymphs Abs: 2.3 10*3/uL (ref 0.7–4.0)
MCHC: 34.4 g/dL (ref 30.0–36.0)
MCV: 94.3 fl (ref 78.0–100.0)
MONO ABS: 0.5 10*3/uL (ref 0.1–1.0)
Monocytes Relative: 8.3 % (ref 3.0–12.0)
Neutro Abs: 2.8 10*3/uL (ref 1.4–7.7)
Neutrophils Relative %: 47.2 % (ref 43.0–77.0)
PLATELETS: 231 10*3/uL (ref 150.0–400.0)
RBC: 4.29 Mil/uL (ref 3.87–5.11)
RDW: 12.7 % (ref 11.5–15.5)
WBC: 5.9 10*3/uL (ref 4.0–10.5)

## 2018-07-10 LAB — LIPID PANEL
Cholesterol: 231 mg/dL — ABNORMAL HIGH (ref 0–200)
HDL: 57.1 mg/dL (ref >39.00)
LDL Cholesterol: 153 mg/dL — ABNORMAL HIGH (ref 0–99)
NonHDL: 173.94
Total CHOL/HDL Ratio: 4
Triglycerides: 107 mg/dL (ref 0.0–149.0)
VLDL: 21.4 mg/dL (ref 0.0–40.0)

## 2018-07-10 LAB — HEMOGLOBIN A1C: Hgb A1c MFr Bld: 5.6 % (ref 4.6–6.5)

## 2018-07-10 LAB — TSH: TSH: 3.76 u[IU]/mL (ref 0.35–4.50)

## 2018-07-10 MED ORDER — SPIRONOLACTONE 25 MG PO TABS: ORAL_TABLET | ORAL | 3 refills | Status: DC

## 2018-07-10 NOTE — Assessment & Plan Note (Signed)
tsh last year slightly elevated, thyroid AB Pos in past Clinically euthyroid Check tsh

## 2018-07-10 NOTE — Assessment & Plan Note (Signed)
Check lipid panel  Diet controlled Regular exercise and healthy diet encouraged  

## 2018-07-10 NOTE — Assessment & Plan Note (Signed)
She has had 4 episodes of difficulty swallowing in the past month-all involved eating meat and being in a hurry and what she thinks was just not chewing enough She denies any GERD For now she will just monitor and try to be more careful when chewing meat and eating quickly If symptoms recur she will need to see GI

## 2018-07-10 NOTE — Assessment & Plan Note (Signed)
BP well controlled Can try to dec spironolactone to 12.5 mg daily and monitor BP at home  cmp

## 2018-07-10 NOTE — Assessment & Plan Note (Signed)
a1c

## 2018-07-11 ENCOUNTER — Encounter: Payer: Self-pay | Admitting: Internal Medicine

## 2018-09-09 NOTE — Progress Notes (Signed)
[name] [address], [place] [address] Phone: [phone] Subjective:   I [name] am serving as a Education administrator for Dr. Hulan Saas.   CC: back pain   [id number]  Emily Anderson is a 58 y.o. female coming in with complaint of back pain. State that she injured her back again exercising. Back and hip. Left hip and back pain. Much better today than previously. At its worse Saturday. States she was in bed all day.   Therapies tried- Ice, heat, home exercises     Past Medical History:  Diagnosis Date  . Allergy   . Dyspepsia    food triggers  . GERD (gastroesophageal reflux disease)   . Heart murmur   . Hyperlipidemia   . Hypertension   . Hypothyroidism    Past Surgical History:  Procedure Laterality Date  . ABLATION    . CESAREAN SECTION     G 2 P 2, both C sections  . COLONOSCOPY  2009   negative; [facility]  . LAPAROSCOPIC VAGINAL HYSTERECTOMY WITH SALPINGECTOMY Bilateral 08/02/2015   Procedure: LAPAROSCOPIC ASSISTED VAGINAL HYSTERECTOMY WITH SALPINGECTOMY;  Surgeon: Paula Compton, MD;  Location: [facility];  Service: Gynecology;  Laterality: Bilateral;   Social History   Socioeconomic History  . Marital status: Married    Spouse name: Not on file  . Number of children: Not on file  . Years of education: Not on file  . Highest education level: Not on file  Occupational History  . 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 Financial resource strain: Not on file  . Food insecurity:    Worry: Not on file    Inability: Not on file  . Transportation needs:    Medical: Not on file    Non-medical: Not on file  Tobacco Use  . Smoking status: Never Smoker  . Smokeless tobacco: Never Used  Substance and Sexual Activity  . Alcohol use: No  . Drug use: No  . Sexual activity: Yes  Lifestyle  . Physical activity:    Days per week: Not on file    Minutes per session: Not on file   . Stress: Not on file  Relationships  . Social connections:    Talks on phone: Not on file    Gets together: Not on file    Attends religious service: Not on file    Active member of club or organization: Not on file    Attends meetings of clubs or organizations: Not on file    Relationship status: Not on file  Other Topics Concern  . Not on file  Social History Narrative   REG EXERCISE..   LOW CARB LOW NA DIET   Allergies  Allergen Reactions  . Amoxicillin Rash    Rash Because of a history of documented adverse serious drug reaction;Medi Alert braceletis recommended Has patient had a PCN reaction causing immediate rash, facial/tongue/throat swelling, SOB or lightheadedness with hypotension: No Has patient had a PCN reaction causing severe rash involving mucus membranes or skin necrosis: No Has patient had a PCN reaction that required hospitalization No Has patient had a PCN reaction occurring within the last 10 years: No If all of the above answers are "N Rash Because of a history of documented adverse serious drug reaction;Medi Alert bracelet  is recommended Has patient had a PCN reaction causing immediate rash, facial/tongue/throat swelling, SOB or lightheadedness with hypotension: No Has patient  had a PCN reaction causing severe rash involving mucus membranes or skin necrosis: No Has patient had a PCN reaction that required hospitalization No Has patient had a PCN reaction occurring within the last 10 years: No If all of the above answers are "N  . Hctz [Hydrochlorothiazide] Other (See Comments)    03/2012 K+ 3.3 03/2012 K+ 3.3  . Sulfamethoxazole-Trimethoprim Rash    H/a's, heart racing & flu symptoms   Family History  Problem Relation Age of Onset  . Thyroid cancer Mother   . Prostate cancer Father   . Heart disease Father 88       angioplasty   . Colon polyps Father   . Diabetes Father   . Other Father 49       carcinoid tumor   . Colon cancer Father   .  Diabetes Paternal Aunt        1  . Diabetes Paternal Uncle         2  . Colon polyps Sister   . Muscular dystrophy Sister   . Stroke Neg Hx   . Esophageal cancer Neg Hx   . Liver cancer Neg Hx   . Pancreatic cancer Neg Hx   . Rectal cancer Neg Hx   . Stomach cancer Neg Hx      Current Outpatient Medications (Cardiovascular):  .  spironolactone (ALDACTONE) 25 MG tablet, TAKE 1 TABLET BY MOUTH EVERY DAY NEEDS OFFICE VISIT  Current Outpatient Medications (Respiratory):  .  cetirizine (ZYRTEC) 10 MG tablet, Take 10 mg by mouth daily.  Current Outpatient Medications (Analgesics):  .  aspirin EC 81 MG tablet, Take 81 mg by mouth daily.   Current Outpatient Medications (Other):  Marland Kitchen  Multiple Vitamins-Minerals (CENTRUM SILVER PO), Take 1 tablet by mouth daily. .  Omega-3 Fatty Acids (FISH OIL PO), Take 1,200 mg by mouth daily. Marland Kitchen  OVER THE COUNTER MEDICATION, Vitamin D 3 1000 units daily. Marland Kitchen  OVER THE COUNTER MEDICATION, Tumeric Curcumin 500 mg. One capsule daily. Marland Kitchen  OVER THE COUNTER MEDICATION, Tart Cherry Extract, 1200 mg one tablet daily.    Past medical history, social, surgical and family history all reviewed in electronic medical record.  No pertanent information unless stated regarding to the chief complaint.   Review of Systems:  No headache, visual changes, nausea, vomiting, diarrhea, constipation, dizziness, abdominal pain, skin rash, fevers, chills, night sweats, weight loss, swollen lymph nodes, body aches, joint swelling, muscle aches, chest pain, shortness of breath, mood changes.  Positive muscle aches  Objective  Blood pressure 100/74, pulse 67, height 5\' 8"  (1.727 m), weight 174 lb (78.9 kg), last menstrual period 07/19/2015, SpO2 96 %.    General: No apparent distress alert and oriented x3 mood and affect normal, dressed appropriately.  HEENT: Pupils equal, extraocular movements intact  Respiratory: Patient's speak in full sentences and does not appear short of  breath  Cardiovascular: No lower extremity edema, non tender, no erythema  Skin: Warm dry intact with no signs of infection or rash on extremities or on axial skeleton.  Abdomen: Soft nontender  Neuro: Cranial nerves II through XII are intact, neurovascularly intact in all extremities with 2+ DTRs and 2+ pulses.  Lymph: No lymphadenopathy of posterior or anterior cervical chain or axillae bilaterally.  Gait normal with good balance and coordination.  MSK:  Non tender with full range of motion and good stability and symmetric strength and tone of shoulders, elbows, wrist, hip, knee and ankles bilaterally.  Lumbar spine loss  of lordosis.  Tightness in the paraspinal musculature lumbar spine bilaterally.  Tightness with Corky Sox test.  Osteopathic findings  T7 extended rotated and side bent left L4 flexed rotated and side bent left  Sacrum right on right     Impression and Recommendations:     This case required medical decision making of moderate complexity. The above documentation has been reviewed and is accurate and complete Lyndal Pulley, DO       Note: This dictation was prepared with Dragon dictation along with smaller phrase technology. Any transcriptional errors that result from this process are unintentional.

## 2018-09-10 ENCOUNTER — Other Ambulatory Visit: Payer: Self-pay

## 2018-09-10 ENCOUNTER — Ambulatory Visit: Payer: 59 | Admitting: Family Medicine

## 2018-09-10 ENCOUNTER — Encounter: Payer: Self-pay | Admitting: Family Medicine

## 2018-09-10 VITALS — BP 100/74 | HR 67 | Ht 68.0 in | Wt 174.0 lb

## 2018-09-10 DIAGNOSIS — M5136 Other intervertebral disc degeneration, lumbar region: Secondary | ICD-10-CM

## 2018-09-10 DIAGNOSIS — M999 Biomechanical lesion, unspecified: Secondary | ICD-10-CM

## 2018-09-10 NOTE — Assessment & Plan Note (Signed)
Degenerative disc disease but has responded well to osteopathic manipulation in the past.  Attempted again today.  Patient tolerated well.  Home exercises were found posture and ergonomics.  Discussed core strengthening hip abductors.  Continue to monitor.  Follow-up again in 4 to 6 weeks

## 2018-09-10 NOTE — Assessment & Plan Note (Signed)
Decision today to treat with OMT was based on Physical Exam  After verbal consent patient was treated with HVLA, ME, FPR techniques in  thoracic, lumbar and sacral areas  Patient tolerated the procedure well with improvement in symptoms  Patient given exercises, stretches and lifestyle modifications  See medications in patient instructions if given  Patient will follow up in 4 weeks 

## 2018-09-10 NOTE — Patient Instructions (Signed)
Good to see you  Ice is your friend pennsaid pinkie amount topically 2 times daily as needed.  Duexis 3 times a day for 3 days Have fun at the beach  Call us at [phone] when you need [place]!

## 2018-09-11 ENCOUNTER — Ambulatory Visit: Payer: 59 | Admitting: Family Medicine

## 2019-04-02 ENCOUNTER — Other Ambulatory Visit: Payer: Self-pay

## 2019-04-02 DIAGNOSIS — Z20822 Contact with and (suspected) exposure to covid-19: Secondary | ICD-10-CM

## 2019-04-05 LAB — NOVEL CORONAVIRUS, NAA: SARS-CoV-2, NAA: NOT DETECTED

## 2019-04-21 ENCOUNTER — Ambulatory Visit: Payer: 59 | Attending: Internal Medicine

## 2019-04-21 DIAGNOSIS — Z20822 Contact with and (suspected) exposure to covid-19: Secondary | ICD-10-CM

## 2019-04-22 LAB — NOVEL CORONAVIRUS, NAA: SARS-CoV-2, NAA: NOT DETECTED

## 2019-07-10 ENCOUNTER — Ambulatory Visit: Payer: 59 | Attending: Internal Medicine

## 2019-07-10 DIAGNOSIS — Z23 Encounter for immunization: Secondary | ICD-10-CM

## 2019-07-10 NOTE — Progress Notes (Signed)
   Covid-19 Vaccination Clinic  Name:  LAQUISTA SHORB    MRN: [id number] DOB: 1961/03/02  07/10/2019  Ms. Boze was observed post Covid-19 immunization for 15 minutes without incident. She was provided with Vaccine Information Sheet and instruction to access the V-Safe system.   Ms. Rupar was instructed to call 911 with any severe reactions post vaccine: Marland Kitchen Difficulty breathing  . Swelling of face and throat  . A fast heartbeat  . A bad rash all over body  . Dizziness and weakness   Immunizations Administered    Name Date Dose VIS Date Route   Pfizer COVID-19 Vaccine 07/10/2019  2:22 PM 0.3 mL 04/11/2019 Intramuscular   Manufacturer: [place]   Lot: [id number]   [place]: [id number]

## 2019-07-13 NOTE — Patient Instructions (Addendum)
For your constipation you can try metamucil, probiotics, stool softeners such colace, miralax daily.     Blood work was ordered.    All other Health Maintenance issues reviewed.   All recommended immunizations and age-appropriate screenings are up-to-date or discussed.  No immunization administered today.   Medications reviewed and updated.  Changes include :   none  Your prescription(s) have been submitted to your pharmacy. Please take as directed and contact our office if you believe you are having problem(s) with the medication(s).  A referral was ordered for GI   Please followup in 1 year    Health Maintenance, Female Adopting a healthy lifestyle and getting preventive care are important in promoting health and wellness. Ask your health care provider about:  The right schedule for you to have regular tests and exams.  Things you can do on your own to prevent diseases and keep yourself healthy. What should I know about diet, weight, and exercise? Eat a healthy diet   Eat a diet that includes plenty of vegetables, fruits, low-fat dairy products, and lean protein.  Do not eat a lot of foods that are high in solid fats, added sugars, or sodium. Maintain a healthy weight Body mass index (BMI) is used to identify weight problems. It estimates body fat based on height and weight. Your health care provider can help determine your BMI and help you achieve or maintain a healthy weight. Get regular exercise Get regular exercise. This is one of the most important things you can do for your health. Most adults should:  Exercise for at least 150 minutes each week. The exercise should increase your heart rate and make you sweat (moderate-intensity exercise).  Do strengthening exercises at least twice a week. This is in addition to the moderate-intensity exercise.  Spend less time sitting. Even light physical activity can be beneficial. Watch cholesterol and blood lipids Have your  blood tested for lipids and cholesterol at 59 years of age, then have this test every 5 years. Have your cholesterol levels checked more often if:  Your lipid or cholesterol levels are high.  You are older than 59 years of age.  You are at high risk for heart disease. What should I know about cancer screening? Depending on your health history and family history, you may need to have cancer screening at various ages. This may include screening for:  Breast cancer.  Cervical cancer.  Colorectal cancer.  Skin cancer.  Lung cancer. What should I know about heart disease, diabetes, and high blood pressure? Blood pressure and heart disease  High blood pressure causes heart disease and increases the risk of stroke. This is more likely to develop in people who have high blood pressure readings, are of African descent, or are overweight.  Have your blood pressure checked: ? Every 3-5 years if you are 71-45 years of age. ? Every year if you are 79 years old or older. Diabetes Have regular diabetes screenings. This checks your fasting blood sugar level. Have the screening done:  Once every three years after age 34 if you are at a normal weight and have a low risk for diabetes.  More often and at a younger age if you are overweight or have a high risk for diabetes. What should I know about preventing infection? Hepatitis B If you have a higher risk for hepatitis B, you should be screened for this virus. Talk with your health care provider to find out if you are at risk for  hepatitis B infection. Hepatitis C Testing is recommended for:  Everyone born from [birth year] through 1965.  Anyone with known risk factors for hepatitis C. Sexually transmitted infections (STIs)  Get screened for STIs, including gonorrhea and chlamydia, if: ? You are sexually active and are younger than 59 years of age. ? You are older than 59 years of age and your health care provider tells you that you are at risk  for this type of infection. ? Your sexual activity has changed since you were last screened, and you are at increased risk for chlamydia or gonorrhea. Ask your health care provider if you are at risk.  Ask your health care provider about whether you are at high risk for HIV. Your health care provider may recommend a prescription medicine to help prevent HIV infection. If you choose to take medicine to prevent HIV, you should first get tested for HIV. You should then be tested every 3 months for as long as you are taking the medicine. Pregnancy  If you are about to stop having your period (premenopausal) and you may become pregnant, seek counseling before you get pregnant.  Take 400 to 800 micrograms (mcg) of folic acid every day if you become pregnant.  Ask for birth control (contraception) if you want to prevent pregnancy. Osteoporosis and menopause Osteoporosis is a disease in which the bones lose minerals and strength with aging. This can result in bone fractures. If you are 75 years old or older, or if you are at risk for osteoporosis and fractures, ask your health care provider if you should:  Be screened for bone loss.  Take a calcium or vitamin D supplement to lower your risk of fractures.  Be given hormone replacement therapy (HRT) to treat symptoms of menopause. Follow these instructions at home: Lifestyle  Do not use any products that contain nicotine or tobacco, such as cigarettes, e-cigarettes, and chewing tobacco. If you need help quitting, ask your health care provider.  Do not use street drugs.  Do not share needles.  Ask your health care provider for help if you need support or information about quitting drugs. Alcohol use  Do not drink alcohol if: ? Your health care provider tells you not to drink. ? You are pregnant, may be pregnant, or are planning to become pregnant.  If you drink alcohol: ? Limit how much you use to 0-1 drink a day. ? Limit intake if you are  breastfeeding.  Be aware of how much alcohol is in your drink. In the [place] one drink equals one 12 oz bottle of beer (355 mL), one 5 oz glass of wine (148 mL), or one 1 oz glass of hard liquor (44 mL). General instructions  Schedule regular health, dental, and eye exams.  Stay current with your vaccines.  Tell your health care provider if: ? You often feel depressed. ? You have ever been abused or do not feel safe at home. Summary  Adopting a healthy lifestyle and getting preventive care are important in promoting health and wellness.  Follow your health care provider's instructions about healthy diet, exercising, and getting tested or screened for diseases.  Follow your health care provider's instructions on monitoring your cholesterol and blood pressure. This information is not intended to replace advice given to you by your health care provider. Make sure you discuss any questions you have with your health care provider. Document Revised: 04/10/2018 Document Reviewed: 04/10/2018 Elsevier Patient Education  2020 [employer].

## 2019-07-13 NOTE — Progress Notes (Signed)
Subjective:    Patient ID: Emily Anderson, female    DOB: Aug 08, 1960, 59 y.o.   MRN: [id number]  HPI She is here for a physical exam.   She is on the thyroid medication and feels much better.  Her gynecologist started it after she saw her a couple of months ago and was reporting not feeling well.  Her TSH was slightly elevated.  She is still struggling with constipation. She will take a laxative as needed. She can go 3-4 days w/o a BM.  Her stool is very hard.     She has started having difficulty swallowing.  This started about 6 months ago and it is sporadic.  It has occurred about 5 times.  She has to chew her food really well.  She has had food get stuck and it is very painful.  She has been on the verge of passing out with these episodes.  She thinks it occurs mostly with meat and when she is rushing to eat.  She has rare GERD.  Looking back at last years visit she also complained of dysphagia.    Medications and allergies reviewed with patient and updated if appropriate.  Patient Active Problem List   Diagnosis Date Noted  . Dysphagia 07/10/2018  . Hyperglycemia 07/09/2018  . Chest pain 03/25/2018  . Palpitations 03/25/2018  . Family history of diabetes mellitus (DM) 03/25/2018  . Degenerative disc disease, lumbar 09/10/2017  . Nonallopathic lesion of lumbosacral region 06/29/2017  . Nonallopathic lesion of sacral region 06/29/2017  . Nonallopathic lesion of thoracic region 06/29/2017  . Greater trochanteric bursitis of both hips 05/28/2017  . Family history of muscular dystrophy 05/11/2017  . Pain of both hip joints 05/11/2017  . Arthralgia 05/11/2017  . Constipation 04/03/2016  . S/P laparoscopic assisted vaginal hysterectomy (LAVH) 08/02/2015  . Subclinical hypothyroidism 02/13/2015  . Greater trochanteric bursitis of right hip 05/19/2014  . Essential hypertension 07/19/2009  . Hyperlipidemia 08/01/2007  . DYSPEPSIA 04/04/2007    Current Outpatient  Medications on File Prior to Visit  Medication Sig Dispense Refill  . aspirin EC 81 MG tablet Take 81 mg by mouth daily.    . cetirizine (ZYRTEC) 10 MG tablet Take 10 mg by mouth daily.    Marland Kitchen levothyroxine (SYNTHROID) 50 MCG tablet levothyroxine 50 mcg tablet  TAKE 1 TABLET BY MOUTH EVERY DAY    . Multiple Vitamins-Minerals (CENTRUM SILVER PO) Take 1 tablet by mouth daily.    . Omega-3 Fatty Acids (FISH OIL PO) Take 1,200 mg by mouth daily.    Marland Kitchen OVER THE COUNTER MEDICATION Vitamin D 3 1000 units daily.    Marland Kitchen OVER THE COUNTER MEDICATION Tumeric Curcumin 500 mg. One capsule daily.    Marland Kitchen OVER THE COUNTER MEDICATION Tart Cherry Extract, 1200 mg one tablet daily.    Marland Kitchen spironolactone (ALDACTONE) 25 MG tablet TAKE 1 TABLET BY MOUTH EVERY DAY NEEDS OFFICE VISIT 90 tablet 3   No current facility-administered medications on file prior to visit.    Past Medical History:  Diagnosis Date  . Allergy   . Dyspepsia    food triggers  . GERD (gastroesophageal reflux disease)   . Heart murmur   . Hyperlipidemia   . Hypertension   . Hypothyroidism     Past Surgical History:  Procedure Laterality Date  . ABLATION    . CESAREAN SECTION     G 2 P 2, both C sections  . COLONOSCOPY  2009   negative; [facility]  . LAPAROSCOPIC VAGINAL HYSTERECTOMY WITH SALPINGECTOMY Bilateral 08/02/2015   Procedure: LAPAROSCOPIC ASSISTED VAGINAL HYSTERECTOMY WITH SALPINGECTOMY;  Surgeon: Paula Compton, MD;  Location: [facility];  Service: Gynecology;  Laterality: Bilateral;    Social History   Socioeconomic History  . Marital status: Married    Spouse name: Not on file  . Number of children: Not on file  . Years of education: Not on file  . Highest education level: Not on file  Occupational History  . Occupation: Best boy: [employer]  Tobacco Use  . Smoking status: Never Smoker  . Smokeless tobacco: Never Used  Substance and Sexual Activity  . Alcohol use: No  . Drug use: No    . Sexual activity: Yes  Other Topics Concern  . Not on file  Social History Narrative   REG EXERCISE..   LOW CARB LOW NA DIET   Social Determinants of Health   Financial Resource Strain:   . Difficulty of Paying Living Expenses:   Food Insecurity:   . Worried About Charity fundraiser in the Last Year:   . Arboriculturist in the Last Year:   Transportation Needs:   . Film/video editor (Medical):   Marland Kitchen Lack of Transportation (Non-Medical):   Physical Activity:   . Days of Exercise per Week:   . Minutes of Exercise per Session:   Stress:   . Feeling of Stress :   Social Connections:   . Frequency of Communication with Friends and Family:   . Frequency of Social Gatherings with Friends and Family:   . Attends Religious Services:   . Active Member of Clubs or Organizations:   . Attends Archivist Meetings:   Marland Kitchen Marital Status:     Family History  Problem Relation Age of Onset  . Thyroid cancer Mother   . Prostate cancer Father   . Heart disease Father 16       angioplasty   . Colon polyps Father   . Diabetes Father   . Other Father 11       carcinoid tumor   . Colon cancer Father   . Diabetes Paternal Aunt        1  . Diabetes Paternal Uncle         2  . Colon polyps Sister   . Muscular dystrophy Sister   . Stroke Neg Hx   . Esophageal cancer Neg Hx   . Liver cancer Neg Hx   . Pancreatic cancer Neg Hx   . Rectal cancer Neg Hx   . Stomach cancer Neg Hx     Review of Systems  Constitutional: Negative for chills and fever.  HENT: Positive for trouble swallowing.   Eyes: Negative for visual disturbance.  Respiratory: Negative for cough, shortness of breath and wheezing.   Cardiovascular: Negative for chest pain, palpitations and leg swelling.  Gastrointestinal: Positive for constipation. Negative for abdominal pain, anal bleeding, blood in stool, diarrhea and nausea.       Rare GERD, no bleching  Genitourinary: Negative for dysuria and hematuria.   Musculoskeletal: Positive for back pain. Negative for arthralgias.  Skin: Negative for color change and rash.  Neurological: Negative for dizziness, light-headedness and headaches.  Psychiatric/Behavioral: Negative for dysphoric mood. The patient is not nervous/anxious.        Objective:   Vitals:   07/14/19 0758  BP: 114/70  Pulse: 62  Resp: 16  Temp: 98 F (36.7  C)  SpO2: 96%   Filed Weights   07/14/19 0758  Weight: 176 lb 6.4 oz (80 kg)   Body mass index is 26.82 kg/m.  BP Readings from Last 3 Encounters:  07/14/19 114/70  09/10/18 100/74  07/10/18 118/80    Wt Readings from Last 3 Encounters:  07/14/19 176 lb 6.4 oz (80 kg)  09/10/18 174 lb (78.9 kg)  07/10/18 175 lb 12.8 oz (79.7 kg)     Physical Exam Constitutional: She appears well-developed and well-nourished. No distress.  HENT:  Head: Normocephalic and atraumatic.  Right Ear: External ear normal. Normal ear canal and TM Left Ear: External ear normal.  Normal ear canal and TM Mouth/Throat: Oropharynx is clear and moist.  Eyes: Conjunctivae and EOM are normal.  Neck: Neck supple. No tracheal deviation present. No thyromegaly present.  No carotid bruit  Cardiovascular: Normal rate, regular rhythm and normal heart sounds.   No murmur heard.  No edema. Pulmonary/Chest: Effort normal and breath sounds normal. No respiratory distress. She has no wheezes. She has no rales.  Breast: deferred   Abdominal: Soft. She exhibits no distension. There is no tenderness.  Lymphadenopathy: She has no cervical adenopathy.  Skin: Skin is warm and dry. She is not diaphoretic.  Psychiatric: She has a normal mood and affect. Her behavior is normal.        Assessment & Plan:   Physical exam: Screening blood work    ordered Immunizations  Had first covid vaccine, td up to date, deferred flu Colonoscopy   Up to date  Mammogram   Up to date  Gyn  Up to date  Eye exams  Up to date  Exercise  Walking Weight  Weight  good for age Substance abuse  none Sees derm annually   See Problem List for Assessment and Plan of chronic medical problems.    This visit occurred during the SARS-CoV-2 public health emergency.  Safety protocols were in place, including screening questions prior to the visit, additional usage of staff PPE, and extensive cleaning of exam room while observing appropriate contact time as indicated for disinfecting solutions.

## 2019-07-14 ENCOUNTER — Ambulatory Visit (INDEPENDENT_AMBULATORY_CARE_PROVIDER_SITE_OTHER): Payer: 59 | Admitting: Internal Medicine

## 2019-07-14 ENCOUNTER — Other Ambulatory Visit: Payer: Self-pay

## 2019-07-14 ENCOUNTER — Encounter: Payer: Self-pay | Admitting: Internal Medicine

## 2019-07-14 VITALS — BP 114/70 | HR 62 | Temp 98.0°F | Resp 16 | Ht 68.0 in | Wt 176.4 lb

## 2019-07-14 DIAGNOSIS — I10 Essential (primary) hypertension: Secondary | ICD-10-CM | POA: Diagnosis not present

## 2019-07-14 DIAGNOSIS — E063 Autoimmune thyroiditis: Secondary | ICD-10-CM

## 2019-07-14 DIAGNOSIS — E7849 Other hyperlipidemia: Secondary | ICD-10-CM | POA: Diagnosis not present

## 2019-07-14 DIAGNOSIS — R739 Hyperglycemia, unspecified: Secondary | ICD-10-CM | POA: Diagnosis not present

## 2019-07-14 DIAGNOSIS — E039 Hypothyroidism, unspecified: Secondary | ICD-10-CM

## 2019-07-14 DIAGNOSIS — Z0000 Encounter for general adult medical examination without abnormal findings: Secondary | ICD-10-CM | POA: Diagnosis not present

## 2019-07-14 DIAGNOSIS — E038 Other specified hypothyroidism: Secondary | ICD-10-CM

## 2019-07-14 DIAGNOSIS — R1310 Dysphagia, unspecified: Secondary | ICD-10-CM

## 2019-07-14 DIAGNOSIS — K5900 Constipation, unspecified: Secondary | ICD-10-CM

## 2019-07-14 LAB — LIPID PANEL
Cholesterol: 219 mg/dL — ABNORMAL HIGH (ref 0–200)
HDL: 50.4 mg/dL (ref >39.00)
LDL Cholesterol: 143 mg/dL — ABNORMAL HIGH (ref 0–99)
NonHDL: 169.04
Total CHOL/HDL Ratio: 4
Triglycerides: 130 mg/dL (ref 0.0–149.0)
VLDL: 26 mg/dL (ref 0.0–40.0)

## 2019-07-14 LAB — COMPREHENSIVE METABOLIC PANEL
ALT: 19 U/L (ref 0–35)
AST: 16 U/L (ref 0–37)
Albumin: 4.1 g/dL (ref 3.5–5.2)
Alkaline Phosphatase: 68 U/L (ref 39–117)
BUN: 17 mg/dL (ref 6–23)
CO2: 27 mEq/L (ref 19–32)
Calcium: 9.5 mg/dL (ref 8.4–10.5)
Chloride: 104 mEq/L (ref 96–112)
Creatinine, Ser: 0.72 mg/dL (ref 0.40–1.20)
GFR: 83.13 mL/min (ref >60.00)
Glucose, Bld: 100 mg/dL — ABNORMAL HIGH (ref 70–99)
Potassium: 4.1 mEq/L (ref 3.5–5.1)
Sodium: 140 mEq/L (ref 135–145)
Total Bilirubin: 0.4 mg/dL (ref 0.2–1.2)
Total Protein: 7.1 g/dL (ref 6.0–8.3)

## 2019-07-14 LAB — CBC WITH DIFFERENTIAL/PLATELET
Basophils Absolute: 0.1 10*3/uL (ref 0.0–0.1)
Basophils Relative: 0.8 % (ref 0.0–3.0)
Eosinophils Absolute: 0.4 10*3/uL (ref 0.0–0.7)
Eosinophils Relative: 5.6 % — ABNORMAL HIGH (ref 0.0–5.0)
HCT: 38.4 % (ref 36.0–46.0)
Hemoglobin: 13.1 g/dL (ref 12.0–15.0)
Lymphocytes Relative: 36 % (ref 12.0–46.0)
Lymphs Abs: 2.3 10*3/uL (ref 0.7–4.0)
MCHC: 34 g/dL (ref 30.0–36.0)
MCV: 93.7 fl (ref 78.0–100.0)
Monocytes Absolute: 0.5 10*3/uL (ref 0.1–1.0)
Monocytes Relative: 7.7 % (ref 3.0–12.0)
Neutro Abs: 3.2 10*3/uL (ref 1.4–7.7)
Neutrophils Relative %: 49.9 % (ref 43.0–77.0)
Platelets: 238 10*3/uL (ref 150.0–400.0)
RBC: 4.09 Mil/uL (ref 3.87–5.11)
RDW: 13 % (ref 11.5–15.5)
WBC: 6.5 10*3/uL (ref 4.0–10.5)

## 2019-07-14 LAB — HEMOGLOBIN A1C: Hgb A1c MFr Bld: 5.3 % (ref 4.6–6.5)

## 2019-07-14 LAB — TSH: TSH: 1.88 u[IU]/mL (ref 0.35–4.50)

## 2019-07-14 MED ORDER — SPIRONOLACTONE 25 MG PO TABS: ORAL_TABLET | ORAL | 3 refills | Status: DC

## 2019-07-14 NOTE — Assessment & Plan Note (Signed)
Intermittent She does not recall that we discussed this last year and forgot that had been going on this long, but now it has been over a year of intermittent episodes of difficulty swallowing.  She feels this higher in the esophagus Has rare GERD She has been trying to be more careful about chewing her meat thoroughly No pill dysphagia Will refer to GI for further evaluation

## 2019-07-14 NOTE — Assessment & Plan Note (Signed)
Check A1c. 

## 2019-07-14 NOTE — Assessment & Plan Note (Signed)
Chronic Check lipid panel  Lifestyle controlled Regular exercise and healthy diet encouraged  

## 2019-07-14 NOTE — Assessment & Plan Note (Signed)
Chronic BP well controlled Current regimen effective and well tolerated Continue current medications at current doses cmp  

## 2019-07-14 NOTE — Assessment & Plan Note (Signed)
Chronic  Clinically euthyroid Check tsh  Titrate med dose if needed  

## 2019-07-14 NOTE — Assessment & Plan Note (Signed)
Chronic in nature Continue regular exercise Stressed the importance of increased water intake Can increase natural fiber Advised trying probiotics, Metamucil, Colace or MiraLAX on a daily basis.  Discussed that she may need more than 1 of these Advised her to [place] me with any questions or concerns

## 2019-07-15 ENCOUNTER — Other Ambulatory Visit: Payer: Self-pay | Admitting: Internal Medicine

## 2019-07-15 ENCOUNTER — Encounter: Payer: Self-pay | Admitting: Internal Medicine

## 2019-07-15 MED ORDER — LEVOTHYROXINE SODIUM 50 MCG PO TABS: ORAL_TABLET | ORAL | 3 refills | Status: DC

## 2019-08-05 ENCOUNTER — Ambulatory Visit: Payer: 59 | Attending: Internal Medicine

## 2019-08-05 DIAGNOSIS — Z23 Encounter for immunization: Secondary | ICD-10-CM

## 2019-08-05 NOTE — Progress Notes (Signed)
   Covid-19 Vaccination Clinic  Name:  Emily Anderson    MRN: [id number] DOB: 02-12-1961  08/05/2019  Emily Anderson was observed post Covid-19 immunization for 15 minutes without incident. She was provided with Vaccine Information Sheet and instruction to access the V-Safe system.   Emily Anderson was instructed to call 911 with any severe reactions post vaccine: Marland Kitchen Difficulty breathing  . Swelling of face and throat  . A fast heartbeat  . A bad rash all over body  . Dizziness and weakness   Immunizations Administered    Name Date Dose VIS Date Route   Pfizer COVID-19 Vaccine 08/05/2019  2:19 PM 0.3 mL 04/11/2019 Intramuscular   Manufacturer: [place]   Lot: [id number]   [place]: [id number]

## 2019-08-19 ENCOUNTER — Encounter: Payer: Self-pay | Admitting: Gastroenterology

## 2019-08-19 ENCOUNTER — Ambulatory Visit: Payer: 59 | Admitting: Gastroenterology

## 2019-08-19 VITALS — BP 110/56 | HR 70 | Temp 98.3°F | Ht 68.0 in | Wt 174.0 lb

## 2019-08-19 DIAGNOSIS — R1310 Dysphagia, unspecified: Secondary | ICD-10-CM

## 2019-08-19 DIAGNOSIS — K5900 Constipation, unspecified: Secondary | ICD-10-CM | POA: Diagnosis not present

## 2019-08-19 MED ORDER — OMEPRAZOLE 20 MG PO CPDR: DELAYED_RELEASE_CAPSULE | ORAL | Status: DC

## 2019-08-19 MED ORDER — POLYETHYLENE GLYCOL 3350 17 GM/SCOOP PO POWD: 17.0000 g | Freq: Every day | ORAL | 0 refills | Status: AC

## 2019-08-19 NOTE — Progress Notes (Signed)
HPI: This is a very pleasant 59 year old woman who was referred to me by Binnie Rail, MD  to evaluate chronic constipation, intermittent dysphagia.    She has had difficulty with constipation for about 4 years.  She believes this started shortly after her hysterectomy.  Her gynecologist does not think that surgery had anything to do with her bowel changes.  She often has to push and strain to move her bowels.  I did a colonoscopy for her 2 years ago.  Seals results summarized below.  She tried Metamucil on a daily basis for several months and that did not seem to help.  She is about to start a MiraLAX trial.  Periodic laxatives do help.  She has also had 1 year of intermittent, mildly progressive mixed solid and liquid dysphagia.  She has had raspy voice, clearing of her throat a lot as well.  She feels that food is hanging in her midsternum.  Drinking water makes at work.  She has never vomited it out.  It is quite painful when this happens for 1 to 2 minutes.  She does not have pyrosis.  Her weight is stable.  Her sister was recently diagnosed with adult onset muscular dystrophy and told her that one of the symptoms was swallowing difficulty  Old Data Reviewed:  Colonoscopy May 2019 Dr. Ardis Hughs for colon cancer screening routine risk, single subcentimeter polyp was removed.  The examination was otherwise completely normal.  Polyp was not retrieved.  She was recommended to have repeat colonoscopy at 10-year interval   Review of systems: Pertinent positive and negative review of systems were noted in the above HPI section. All other review negative.   Past Medical History:  Diagnosis Date  . Allergy   . Dyspepsia    food triggers  . GERD (gastroesophageal reflux disease)   . Heart murmur   . Hyperlipidemia   . Hypertension   . Hypothyroidism     Past Surgical History:  Procedure Laterality Date  . ABLATION    . CESAREAN SECTION     G 2 P 2, both C sections  . COLONOSCOPY  2009    negative; [facility]  . LAPAROSCOPIC VAGINAL HYSTERECTOMY WITH SALPINGECTOMY Bilateral 08/02/2015   Procedure: LAPAROSCOPIC ASSISTED VAGINAL HYSTERECTOMY WITH SALPINGECTOMY;  Surgeon: Paula Compton, MD;  Location: [facility];  Service: Gynecology;  Laterality: Bilateral;    Current Outpatient Medications  Medication Sig Dispense Refill  . aspirin EC 81 MG tablet Take 81 mg by mouth daily.    . cetirizine (ZYRTEC) 10 MG tablet Take 10 mg by mouth daily.    Marland Kitchen levothyroxine (SYNTHROID) 50 MCG tablet TAKE 1 TABLET BY MOUTH EVERY DAY 90 tablet 3  . Multiple Vitamins-Minerals (CENTRUM SILVER PO) Take 1 tablet by mouth daily.    . Omega-3 Fatty Acids (FISH OIL PO) Take 1,200 mg by mouth daily.    Marland Kitchen OVER THE COUNTER MEDICATION Vitamin D 3 1000 units daily.    Marland Kitchen OVER THE COUNTER MEDICATION Tumeric Curcumin 500 mg. One capsule daily.    Marland Kitchen OVER THE COUNTER MEDICATION Tart Cherry Extract, 1200 mg one tablet daily.    Marland Kitchen spironolactone (ALDACTONE) 25 MG tablet TAKE 1 TABLET BY MOUTH EVERY DAY NEEDS OFFICE VISIT 90 tablet 3   No current facility-administered medications for this visit.    Allergies as of 08/19/2019 - Review Complete 08/19/2019  Allergen Reaction Noted  . Amoxicillin Rash 03/03/2011  . Hctz [hydrochlorothiazide] Other (See Comments) 04/18/2012  . Sulfamethoxazole-trimethoprim  Rash 02/10/2016    Family History  Problem Relation Age of Onset  . Thyroid cancer Mother   . Prostate cancer Father   . Heart disease Father 72       angioplasty   . Colon polyps Father   . Diabetes Father   . Other Father 82       carcinoid tumor   . Colon cancer Father   . Diabetes Paternal Aunt        1  . Diabetes Paternal Uncle         2  . Colon polyps Sister   . Muscular dystrophy Sister   . Stroke Neg Hx   . Esophageal cancer Neg Hx   . Liver cancer Neg Hx   . Pancreatic cancer Neg Hx   . Rectal cancer Neg Hx   . Stomach cancer Neg Hx     Social History   Socioeconomic History  .  Marital status: Married    Spouse name: Not on file  . Number of children: Not on file  . Years of education: Not on file  . Highest education level: Not on file  Occupational History  . Occupation: Best boy: [employer]  Tobacco Use  . Smoking status: Never Smoker  . Smokeless tobacco: Never Used  Substance and Sexual Activity  . Alcohol use: No  . Drug use: No  . Sexual activity: Yes  Other Topics Concern  . Not on file  Social History Narrative   REG EXERCISE..   LOW CARB LOW NA DIET   Social Determinants of Health   Financial Resource Strain:   . Difficulty of Paying Living Expenses:   Food Insecurity:   . Worried About Charity fundraiser in the Last Year:   . Arboriculturist in the Last Year:   Transportation Needs:   . Film/video editor (Medical):   Marland Kitchen Lack of Transportation (Non-Medical):   Physical Activity:   . Days of Exercise per Week:   . Minutes of Exercise per Session:   Stress:   . Feeling of Stress :   Social Connections:   . Frequency of Communication with Friends and Family:   . Frequency of Social Gatherings with Friends and Family:   . Attends Religious Services:   . Active Member of Clubs or Organizations:   . Attends Archivist Meetings:   Marland Kitchen Marital Status:   Intimate Partner Violence:   . Fear of Current or Ex-Partner:   . Emotionally Abused:   Marland Kitchen Physically Abused:   . Sexually Abused:      Physical Exam: BP (!) 110/56   Pulse 70   Temp 98.3 F (36.8 C)   Ht 5\' 8"  (1.727 m)   Wt 174 lb (78.9 kg)   LMP 07/19/2015 (Exact Date)   BMI 26.46 kg/m  Constitutional: generally well-appearing Psychiatric: alert and oriented x3 Eyes: extraocular movements intact Mouth: oral pharynx moist, no lesions Neck: supple no lymphadenopathy Cardiovascular: heart regular rate and rhythm Lungs: clear to auscultation bilaterally Abdomen: soft, nontender, nondistended, no obvious ascites, no peritoneal  signs, normal bowel sounds Extremities: no lower extremity edema bilaterally Skin: no lesions on visible extremities   Assessment and plan: 59 y.o. female with chronic constipation, dysphagia  First her constipation is mild, sounds functional.  I doubt it has anything to do with her dysphagia.  I explained as such to her.  I recommended she try MiraLAX 1 dose once  daily and to let me know how that works.  I explained to her that acid irritation of the esophagus is a very common cause of dysphagia.  I recommended a trial of over-the-counter strength proton pump inhibitor 1 pill once daily and I recommended an upper endoscopy to exclude other potential causes.  I see no reason for any further blood tests or imaging studies prior to then.   Please see the "Patient Instructions" section for addition details about the plan.   Owens Loffler, MD [facility] 08/19/2019, 3:14 PM  Cc: Binnie Rail, MD  Total time on date of encounter was 45  minutes (this included time spent preparing to see the patient reviewing records; obtaining and/or reviewing separately obtained history; performing a medically appropriate exam and/or evaluation; counseling and educating the patient and family if present; ordering medications, tests or procedures if applicable; and documenting clinical information in the health record).

## 2019-08-19 NOTE — Patient Instructions (Addendum)
If you are age 59 or older, your body mass index should be between 23-30. Your Body mass index is 26.46 kg/m. If this is out of the aforementioned range listed, please consider follow up with your Primary Care Provider.  If you are age 37 or younger, your body mass index should be between 19-25. Your Body mass index is 26.46 kg/m. If this is out of the aformentioned range listed, please consider follow up with your Primary Care Provider.   You have been scheduled for an endoscopy. Please follow written instructions given to you at your visit today. If you use inhalers (even only as needed), please bring them with you on the day of your procedure.   Please purchase the following medications over the counter and take as directed:  START: Miralax powder daily.  START: omeprazole 20mg  take one capsule shortly before breakfast meal daily  Stay hydrated by drinking plenty of fluids.   Thank you for entrusting me with your care and choosing [facility].  Dr Ardis Hughs

## 2019-10-01 ENCOUNTER — Encounter: Payer: Self-pay | Admitting: Gastroenterology

## 2019-10-01 ENCOUNTER — Other Ambulatory Visit: Payer: Self-pay

## 2019-10-01 ENCOUNTER — Ambulatory Visit (AMBULATORY_SURGERY_CENTER): Payer: 59 | Admitting: Gastroenterology

## 2019-10-01 VITALS — BP 118/60 | HR 62 | Temp 97.1°F | Resp 18 | Ht 68.0 in | Wt 174.0 lb

## 2019-10-01 DIAGNOSIS — R1310 Dysphagia, unspecified: Secondary | ICD-10-CM | POA: Diagnosis present

## 2019-10-01 MED ORDER — OMEPRAZOLE 40 MG PO CPDR: 40.0000 mg | DELAYED_RELEASE_CAPSULE | Freq: Every day | ORAL | 3 refills | Status: DC

## 2019-10-01 MED ORDER — SODIUM CHLORIDE 0.9 % IV SOLN: 500.0000 mL | Freq: Once | INTRAVENOUS | Status: DC

## 2019-10-01 NOTE — Progress Notes (Signed)
Vitals-DT  Pt's states no medical or surgical changes since previsit or office visit.  

## 2019-10-01 NOTE — Op Note (Signed)
[facility] Patient Name: Emily Anderson Procedure Date: 10/01/2019 9:21 AM MRN: [id number] Endoscopist: Milus Banister , MD Age: 59 Referring MD:  Date of Birth: 1961-05-01 Gender: Female Account #: [id number] Procedure:                Upper GI endoscopy Indications:              Dysphagia Medicines:                Monitored Anesthesia Care Procedure:                Pre-Anesthesia Assessment:                           - Prior to the procedure, a History and Physical                            was performed, and patient medications and                            allergies were reviewed. The patient's tolerance of                            previous anesthesia was also reviewed. The risks                            and benefits of the procedure and the sedation                            options and risks were discussed with the patient.                            All questions were answered, and informed consent                            was obtained. Prior Anticoagulants: The patient has                            taken no previous anticoagulant or antiplatelet                            agents. ASA Grade Assessment: II - A patient with                            mild systemic disease. After reviewing the risks                            and benefits, the patient was deemed in                            satisfactory condition to undergo the procedure.                           After obtaining informed consent, the endoscope was  passed under direct vision. Throughout the                            procedure, the patient's blood pressure, pulse, and                            oxygen saturations were monitored continuously. The                            Endoscope was introduced through the mouth, and                            advanced to the second part of duodenum. The upper                            GI endoscopy was accomplished without  difficulty.                            The patient tolerated the procedure well. Scope In: Scope Out: Findings:                 The esophagus was normal.                           The stomach was normal.                           The examined duodenum was normal. Complications:            No immediate complications. Estimated blood loss:                            None. Estimated Blood Loss:     Estimated blood loss: none. Impression:               - Normal UGI tract. Recommendation:           - Patient has a contact number available for                            emergencies. The signs and symptoms of potential                            delayed complications were discussed with the                            patient. Return to normal activities tomorrow.                            Written discharge instructions were provided to the                            patient.                           - Resume previous diet.                           -  Since OTC omeprazole has helped alot but not                            completely, please start prescription strength                            omeprazole (new script called in today omeprazole                            40mg  pills, one pill 20-30 min before breakfast                            meal daily, disp 1 month with 11 refills) and call                            my office in 6-7 weeks to report on your progress. Milus Banister, MD 10/01/2019 9:38:37 AM This report has been signed electronically.

## 2019-10-01 NOTE — Progress Notes (Signed)
Report to PACU, RN, vss, BBS= Clear.  

## 2019-10-01 NOTE — Patient Instructions (Signed)
Thank you for allowing [place] to care for you today!  Resume previous diet and medications today except Omeprazole.  Increasing Omeprazole to 40 mg every morning 30 minutes before breakfast.    Call Dr Ardis Hughs office in 6-7 weeks to report your progress.  Return to your normal activities tomorrow.    YOU HAD AN ENDOSCOPIC PROCEDURE TODAY AT [facility]:   Refer to the procedure report that was given to you for any specific questions about what was found during the examination.  If the procedure report does not answer your questions, please call your gastroenterologist to clarify.  If you requested that your care partner not be given the details of your procedure findings, then the procedure report has been included in a sealed envelope for you to review at your convenience later.  YOU SHOULD EXPECT: Some feelings of bloating in the abdomen. Passage of more gas than usual.  Walking can help get rid of the air that was put into your GI tract during the procedure and reduce the bloating. If you had a lower endoscopy (such as a colonoscopy or flexible sigmoidoscopy) you may notice spotting of blood in your stool or on the toilet paper. If you underwent a bowel prep for your procedure, you may not have a normal bowel movement for a few days.  Please Note:  You might notice some irritation and congestion in your nose or some drainage.  This is from the oxygen used during your procedure.  There is no need for concern and it should clear up in a day or so.  SYMPTOMS TO REPORT IMMEDIATELY:    Following upper endoscopy (EGD)  Vomiting of blood or coffee ground material  New chest pain or pain under the shoulder blades  Painful or persistently difficult swallowing  New shortness of breath  Fever of 100F or higher  Black, tarry-looking stools  For urgent or emergent issues, a gastroenterologist can be reached at any hour by calling [phone]. Do not use MyChart messaging for  urgent concerns.    DIET:  We do recommend a small meal at first, but then you may proceed to your regular diet.  Drink plenty of fluids but you should avoid alcoholic beverages for 24 hours.  ACTIVITY:  You should plan to take it easy for the rest of today and you should NOT DRIVE or use heavy machinery until tomorrow (because of the sedation medicines used during the test).    FOLLOW UP: Our staff will call the number listed on your records 48-72 hours following your procedure to check on you and address any questions or concerns that you may have regarding the information given to you following your procedure. If we do not reach you, we will leave a message.  We will attempt to reach you two times.  During this call, we will ask if you have developed any symptoms of COVID 19. If you develop any symptoms (ie: fever, flu-like symptoms, shortness of breath, cough etc.) before then, please call [phone].  If you test positive for Covid 19 in the 2 weeks post procedure, please call and report this information to [place].    If any biopsies were taken you will be contacted by phone or by letter within the next 1-3 weeks.  Please call us at [phone] if you have not heard about the biopsies in 3 weeks.    SIGNATURES/CONFIDENTIALITY: You and/or your care partner have signed paperwork which will be entered into your  electronic medical record.  These signatures attest to the fact that that the information above on your After Visit Summary has been reviewed and is understood.  Full responsibility of the confidentiality of this discharge information lies with you and/or your care-partner.

## 2019-10-03 ENCOUNTER — Telehealth: Payer: Self-pay

## 2019-10-03 NOTE — Telephone Encounter (Signed)
  Follow up Call-  Call back number 10/01/2019 08/31/2017  Post procedure Call Back phone  # [phone]  Permission to leave phone message Yes Yes  Some recent data might be hidden     Patient questions:  Do you have a fever, pain , or abdominal swelling? No. Pain Score  0 *  Have you tolerated food without any problems? Yes.    Have you been able to return to your normal activities? Yes.    Do you have any questions about your discharge instructions: Diet   No. Medications  No. Follow up visit  No.  Do you have questions or concerns about your Care? No.  Actions: * If pain score is 4 or above: No action needed, pain <4.    1. Have you developed a fever since your procedure? no  2.   Have you had an respiratory symptoms (SOB or cough) since your procedure? No  3.   Have you tested positive for COVID 19 since your procedure no  4.   Have you had any family members/close contacts diagnosed with the COVID 19 since your procedure?  no   If yes to any of these questions please route to Joylene John, RN and Erenest Rasher, RN

## 2019-12-09 IMAGING — DX DG LUMBAR SPINE 2-3V
3 series · 3 of 3 positions shown · non-contrast
Comparison: None.

CLINICAL DATA: 56-year-old female with chronic bilateral hip pain
worsening over the past few months. No known injury. Initial
encounter.

EXAM:
LUMBAR SPINE - 2-3 VIEW

[l-spine ap]
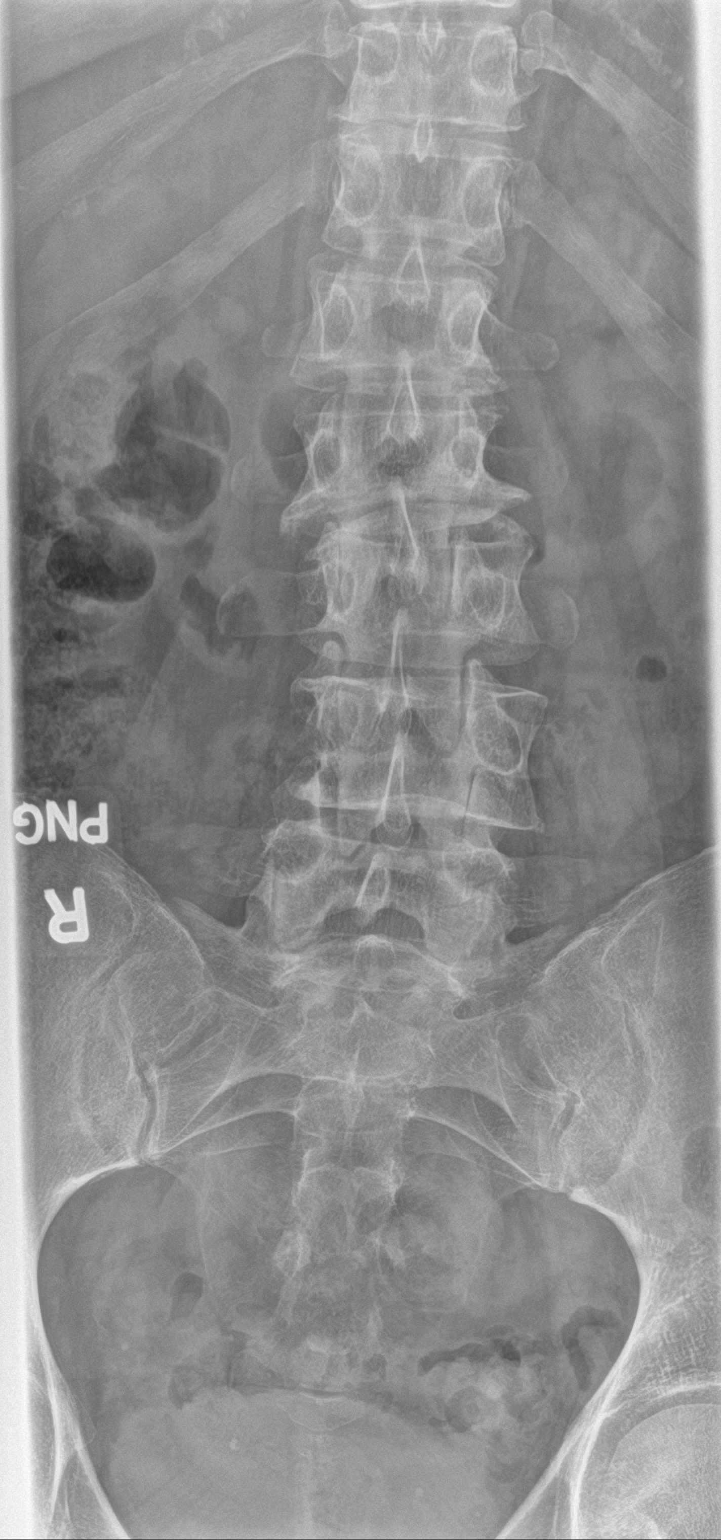

[l-spine lat]
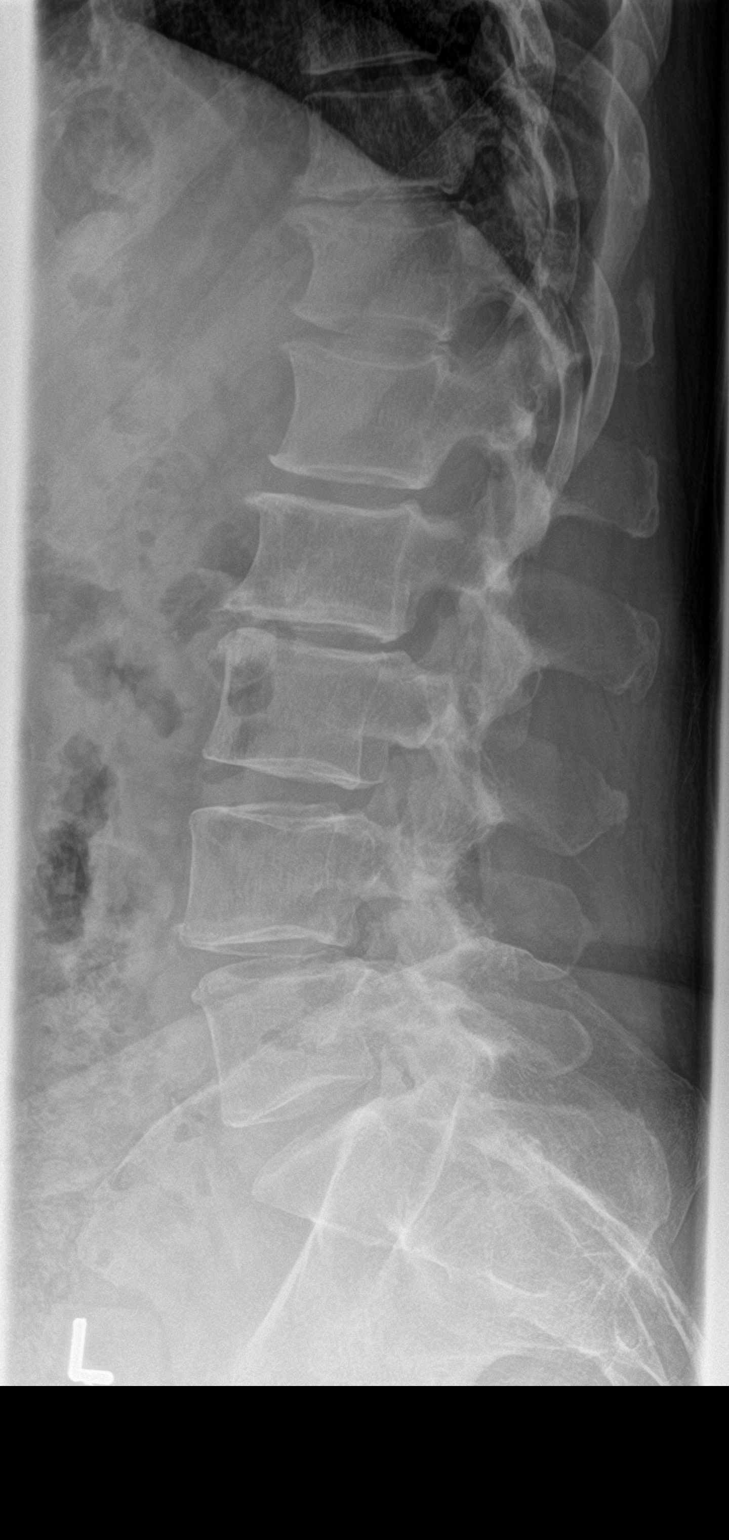

[l-spine spot]
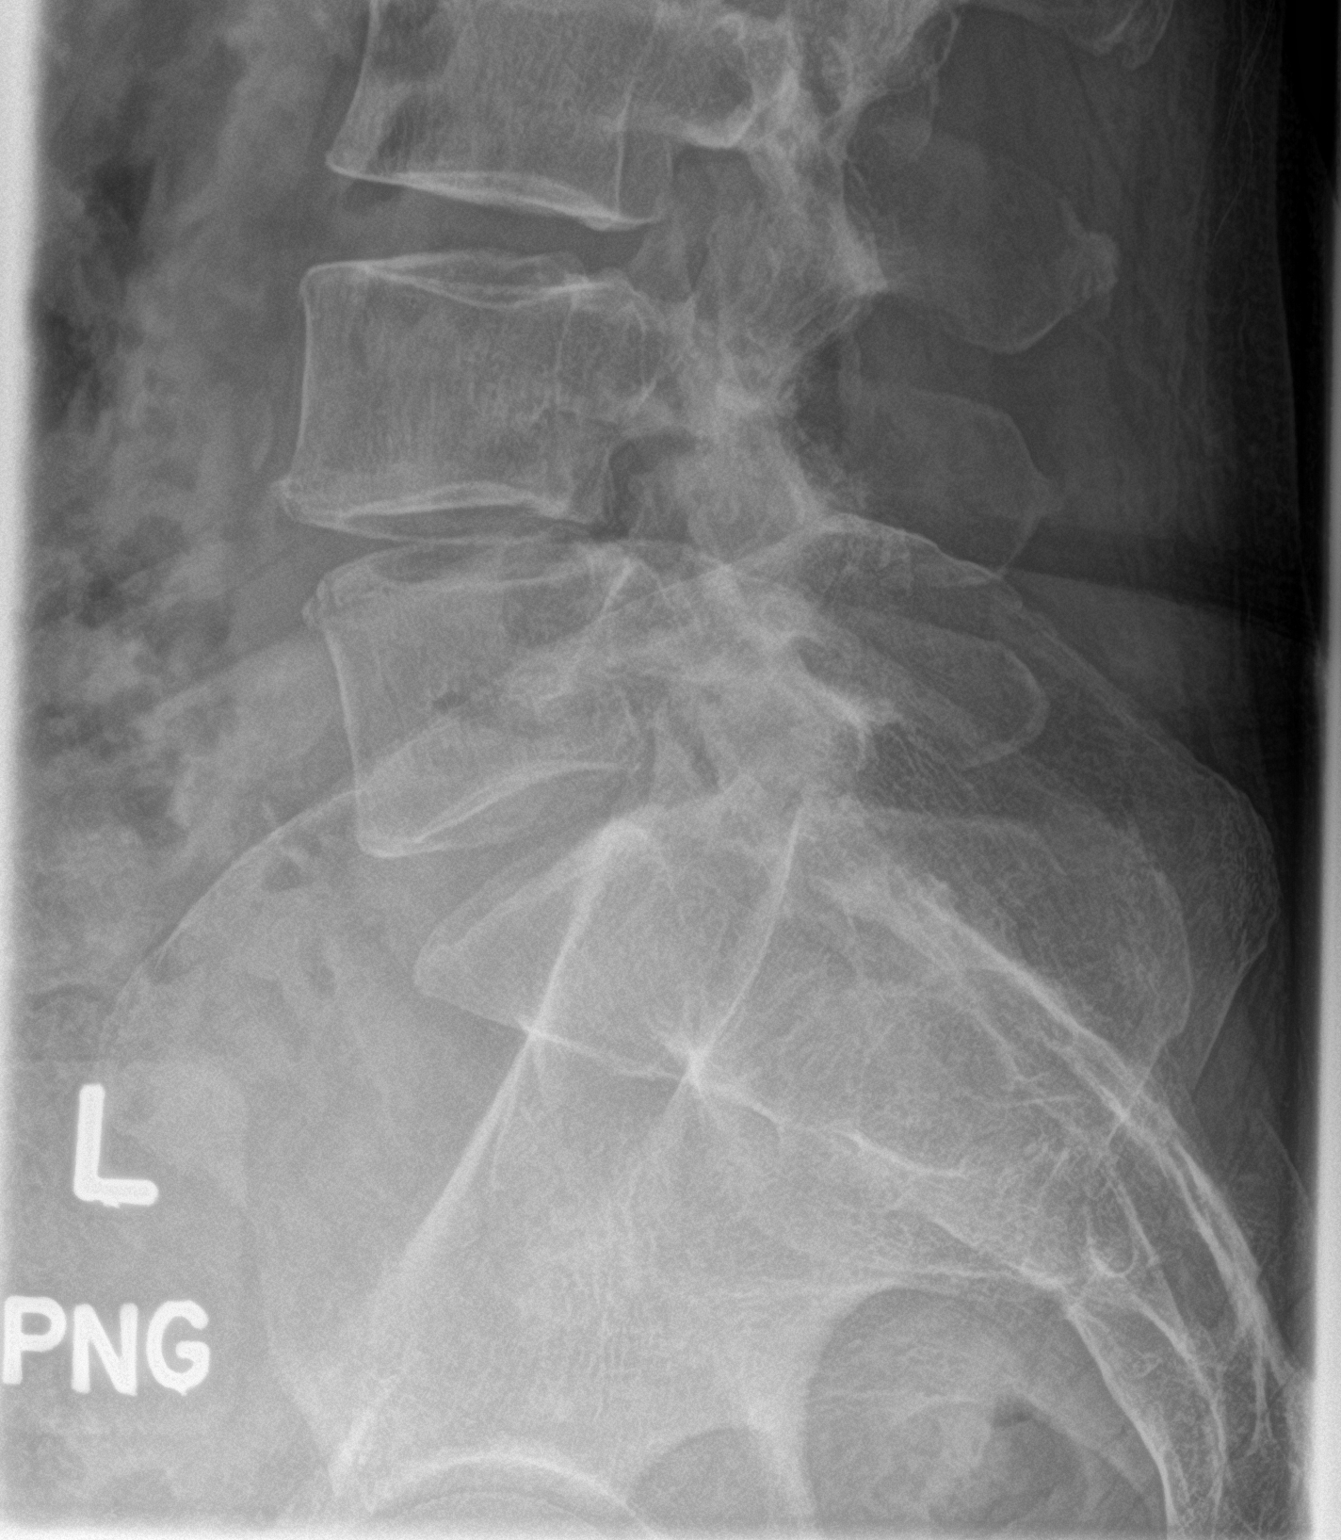

[3 of 3 positions shown; findings below may reference images not displayed]

FINDINGS: Mild curvature of the lumbar spine.

Minimal left-sided L1-2 disc space narrowing.

Moderate L2-3 disc degeneration with disc space narrowing and
osteophyte. Mild rotation of L2 upon L3 towards the right.

Minimal right-sided L3-4 disc space narrowing.

Mild L4-5 disc space narrowing greater on the right with mild facet
degenerative changes.

T11-12 disc degeneration with anterior osteophyte.
IMPRESSION: Degenerative changes of the lumbar spine most prominent at the L2-3
level as detailed above.

## 2019-12-16 ENCOUNTER — Encounter: Payer: Self-pay | Admitting: Internal Medicine

## 2019-12-16 DIAGNOSIS — E038 Other specified hypothyroidism: Secondary | ICD-10-CM

## 2020-01-08 ENCOUNTER — Other Ambulatory Visit: Payer: Self-pay | Admitting: Internal Medicine

## 2020-01-08 DIAGNOSIS — Z808 Family history of malignant neoplasm of other organs or systems: Secondary | ICD-10-CM

## 2020-01-08 DIAGNOSIS — E041 Nontoxic single thyroid nodule: Secondary | ICD-10-CM

## 2020-01-22 ENCOUNTER — Ambulatory Visit
Admission: RE | Admit: 2020-01-22 | Discharge: 2020-01-22 | Disposition: A | Discharge status: Home / Self Care | Payer: 59 | Source: Ambulatory Visit | Attending: Internal Medicine | Admitting: Internal Medicine

## 2020-01-22 DIAGNOSIS — E041 Nontoxic single thyroid nodule: Secondary | ICD-10-CM

## 2020-01-22 DIAGNOSIS — Z808 Family history of malignant neoplasm of other organs or systems: Secondary | ICD-10-CM

## 2020-07-13 NOTE — Progress Notes (Signed)
Subjective:    Patient ID: Emily Anderson, female    DOB: October 20, 1960, 60 y.o.   MRN: [id number]   This visit occurred during the SARS-CoV-2 public health emergency.  Safety protocols were in place, including screening questions prior to the visit, additional usage of staff PPE, and extensive cleaning of exam room while observing appropriate contact time as indicated for disinfecting solutions.    HPI She is here for a physical exam.     Medications and allergies reviewed with patient and updated if appropriate.  Patient Active Problem List   Diagnosis Date Noted  . Dysphagia 07/10/2018  . Hyperglycemia 07/09/2018  . Chest pain 03/25/2018  . Palpitations 03/25/2018  . Family history of diabetes mellitus (DM) 03/25/2018  . Degenerative disc disease, lumbar 09/10/2017  . Nonallopathic lesion of lumbosacral region 06/29/2017  . Nonallopathic lesion of sacral region 06/29/2017  . Nonallopathic lesion of thoracic region 06/29/2017  . Greater trochanteric bursitis of both hips 05/28/2017  . Family history of muscular dystrophy 05/11/2017  . Pain of both hip joints 05/11/2017  . Arthralgia 05/11/2017  . S/P laparoscopic assisted vaginal hysterectomy (LAVH) 08/02/2015  . Hypothyroidism due to Hashimoto's thyroiditis 02/13/2015  . Greater trochanteric bursitis of right hip 05/19/2014  . Essential hypertension 07/19/2009  . Hypercholesterolemia 08/01/2007    Current Outpatient Medications on File Prior to Visit  Medication Sig Dispense Refill  . aspirin EC 81 MG tablet Take 81 mg by mouth daily.    . cetirizine (ZYRTEC) 10 MG tablet Take 10 mg by mouth daily.    Marland Kitchen levothyroxine (SYNTHROID) 50 MCG tablet TAKE 1 TABLET BY MOUTH EVERY DAY 90 tablet 3  . Multiple Vitamins-Minerals (CENTRUM SILVER PO) Take 1 tablet by mouth daily.    . Omega-3 Fatty Acids (FISH OIL PO) Take 1,200 mg by mouth daily.    Marland Kitchen omeprazole (PRILOSEC) 40 MG capsule Take 1 capsule (40 mg total) by mouth  daily before breakfast. 90 capsule 3  . OVER THE COUNTER MEDICATION Vitamin D 3 1000 units daily.    Marland Kitchen OVER THE COUNTER MEDICATION Tumeric Curcumin 500 mg. One capsule daily.    Marland Kitchen OVER THE COUNTER MEDICATION Tart Cherry Extract, 1200 mg one tablet daily.    . polyethylene glycol powder (MIRALAX) 17 GM/SCOOP powder Take 17 g by mouth daily. 17 g 0  . spironolactone (ALDACTONE) 25 MG tablet TAKE 1 TABLET BY MOUTH EVERY DAY NEEDS OFFICE VISIT 90 tablet 3   No current facility-administered medications on file prior to visit.    Past Medical History:  Diagnosis Date  . Allergy   . Dyspepsia    food triggers  . GERD (gastroesophageal reflux disease)   . Heart murmur   . Hyperlipidemia   . Hypertension   . Hypothyroidism     Past Surgical History:  Procedure Laterality Date  . ABLATION    . CESAREAN SECTION     G 2 P 2, both C sections  . COLONOSCOPY  2009   negative; [facility]  . LAPAROSCOPIC VAGINAL HYSTERECTOMY WITH SALPINGECTOMY Bilateral 08/02/2015   Procedure: LAPAROSCOPIC ASSISTED VAGINAL HYSTERECTOMY WITH SALPINGECTOMY;  Surgeon: Paula Compton, MD;  Location: [facility];  Service: Gynecology;  Laterality: Bilateral;    Social History   Socioeconomic History  . Marital status: Married    Spouse name: Not on file  . Number of children: Not on file  . Years of education: Not on file  . Highest education level: Not on file  Occupational  History  . Occupation: Best boy: [employer]  Tobacco Use  . Smoking status: Never Smoker  . Smokeless tobacco: Never Used  Substance and Sexual Activity  . Alcohol use: No  . Drug use: No  . Sexual activity: Yes  Other Topics Concern  . Not on file  Social History Narrative   REG EXERCISE..   LOW CARB LOW NA DIET   Social Determinants of Health   Financial Resource Strain: Not on file  Food Insecurity: Not on file  Transportation Needs: Not on file  Physical Activity: Not on file  Stress: Not  on file  Social Connections: Not on file    Family History  Problem Relation Age of Onset  . Thyroid cancer Mother   . Prostate cancer Father   . Heart disease Father 68       angioplasty   . Colon polyps Father   . Diabetes Father   . Other Father 40       carcinoid tumor   . Colon cancer Father   . Diabetes Paternal Aunt        1  . Diabetes Paternal Uncle         2  . Colon polyps Sister   . Muscular dystrophy Sister   . Stroke Neg Hx   . Esophageal cancer Neg Hx   . Liver cancer Neg Hx   . Pancreatic cancer Neg Hx   . Rectal cancer Neg Hx   . Stomach cancer Neg Hx     Review of Systems     Objective:  There were no vitals filed for this visit. There were no vitals filed for this visit. There is no height or weight on file to calculate BMI.  BP Readings from Last 3 Encounters:  10/01/19 118/60  08/19/19 (!) 110/56  07/14/19 114/70    Wt Readings from Last 3 Encounters:  10/01/19 174 lb (78.9 kg)  08/19/19 174 lb (78.9 kg)  07/14/19 176 lb 6.4 oz (80 kg)     Physical Exam Constitutional: She appears well-developed and well-nourished. No distress.  HENT:  Head: Normocephalic and atraumatic.  Right Ear: External ear normal. Normal ear canal and TM Left Ear: External ear normal.  Normal ear canal and TM Mouth/Throat: Oropharynx is clear and moist.  Eyes: Conjunctivae and EOM are normal.  Neck: Neck supple. No tracheal deviation present. No thyromegaly present.  No carotid bruit  Cardiovascular: Normal rate, regular rhythm and normal heart sounds.   No murmur heard.  No edema. Pulmonary/Chest: Effort normal and breath sounds normal. No respiratory distress. She has no wheezes. She has no rales.  Breast: deferred   Abdominal: Soft. She exhibits no distension. There is no tenderness.  Lymphadenopathy: She has no cervical adenopathy.  Skin: Skin is warm and dry. She is not diaphoretic.  Psychiatric: She has a normal mood and affect. Her behavior is normal.         Assessment & Plan:   Physical exam: Screening blood work    ordered Immunizations  ? Flu, ? covid booster, discussed shingrix Colonoscopy  Up to date Mammogram  Up to date  Gyn  Up to date - [facility] exams  Up to date  Exercise  walking Weight  Mildly overweight Substance abuse  none      See Problem List for Assessment and Plan of chronic medical problems.      This encounter was created in error - please disregard.

## 2020-07-13 NOTE — Patient Instructions (Addendum)
Blood work was ordered.     Medications changes include :     Your prescription(s) have been submitted to your pharmacy. Please take as directed and contact our office if you believe you are having problem(s) with the medication(s).   A referral was ordered for        Someone from their office will call you to schedule an appointment.    Please followup in 1 year for a physical    Health Maintenance, Female Adopting a healthy lifestyle and getting preventive care are important in promoting health and wellness. Ask your health care provider about:  The right schedule for you to have regular tests and exams.  Things you can do on your own to prevent diseases and keep yourself healthy. What should I know about diet, weight, and exercise? Eat a healthy diet  Eat a diet that includes plenty of vegetables, fruits, low-fat dairy products, and lean protein.  Do not eat a lot of foods that are high in solid fats, added sugars, or sodium.   Maintain a healthy weight Body mass index (BMI) is used to identify weight problems. It estimates body fat based on height and weight. Your health care provider can help determine your BMI and help you achieve or maintain a healthy weight. Get regular exercise Get regular exercise. This is one of the most important things you can do for your health. Most adults should:  Exercise for at least 150 minutes each week. The exercise should increase your heart rate and make you sweat (moderate-intensity exercise).  Do strengthening exercises at least twice a week. This is in addition to the moderate-intensity exercise.  Spend less time sitting. Even light physical activity can be beneficial. Watch cholesterol and blood lipids Have your blood tested for lipids and cholesterol at 60 years of age, then have this test every 5 years. Have your cholesterol levels checked more often if:  Your lipid or cholesterol levels are high.  You are older than 60 years  of age.  You are at high risk for heart disease. What should I know about cancer screening? Depending on your health history and family history, you may need to have cancer screening at various ages. This may include screening for:  Breast cancer.  Cervical cancer.  Colorectal cancer.  Skin cancer.  Lung cancer. What should I know about heart disease, diabetes, and high blood pressure? Blood pressure and heart disease  High blood pressure causes heart disease and increases the risk of stroke. This is more likely to develop in people who have high blood pressure readings, are of African descent, or are overweight.  Have your blood pressure checked: ? Every 3-5 years if you are 51-15 years of age. ? Every year if you are 79 years old or older. Diabetes Have regular diabetes screenings. This checks your fasting blood sugar level. Have the screening done:  Once every three years after age 19 if you are at a normal weight and have a low risk for diabetes.  More often and at a younger age if you are overweight or have a high risk for diabetes. What should I know about preventing infection? Hepatitis B If you have a higher risk for hepatitis B, you should be screened for this virus. Talk with your health care provider to find out if you are at risk for hepatitis B infection. Hepatitis C Testing is recommended for:  Everyone born from [birth year] through 1965.  Anyone with known risk factors for  hepatitis C. Sexually transmitted infections (STIs)  Get screened for STIs, including gonorrhea and chlamydia, if: ? You are sexually active and are younger than 60 years of age. ? You are older than 60 years of age and your health care provider tells you that you are at risk for this type of infection. ? Your sexual activity has changed since you were last screened, and you are at increased risk for chlamydia or gonorrhea. Ask your health care provider if you are at risk.  Ask your health care  provider about whether you are at high risk for HIV. Your health care provider may recommend a prescription medicine to help prevent HIV infection. If you choose to take medicine to prevent HIV, you should first get tested for HIV. You should then be tested every 3 months for as long as you are taking the medicine. Pregnancy  If you are about to stop having your period (premenopausal) and you may become pregnant, seek counseling before you get pregnant.  Take 400 to 800 micrograms (mcg) of folic acid every day if you become pregnant.  Ask for birth control (contraception) if you want to prevent pregnancy. Osteoporosis and menopause Osteoporosis is a disease in which the bones lose minerals and strength with aging. This can result in bone fractures. If you are 58 years old or older, or if you are at risk for osteoporosis and fractures, ask your health care provider if you should:  Be screened for bone loss.  Take a calcium or vitamin D supplement to lower your risk of fractures.  Be given hormone replacement therapy (HRT) to treat symptoms of menopause. Follow these instructions at home: Lifestyle  Do not use any products that contain nicotine or tobacco, such as cigarettes, e-cigarettes, and chewing tobacco. If you need help quitting, ask your health care provider.  Do not use street drugs.  Do not share needles.  Ask your health care provider for help if you need support or information about quitting drugs. Alcohol use  Do not drink alcohol if: ? Your health care provider tells you not to drink. ? You are pregnant, may be pregnant, or are planning to become pregnant.  If you drink alcohol: ? Limit how much you use to 0-1 drink a day. ? Limit intake if you are breastfeeding.  Be aware of how much alcohol is in your drink. In the [place] one drink equals one 12 oz bottle of beer (355 mL), one 5 oz glass of wine (148 mL), or one 1 oz glass of hard liquor (44 mL). General  instructions  Schedule regular health, dental, and eye exams.  Stay current with your vaccines.  Tell your health care provider if: ? You often feel depressed. ? You have ever been abused or do not feel safe at home. Summary  Adopting a healthy lifestyle and getting preventive care are important in promoting health and wellness.  Follow your health care provider's instructions about healthy diet, exercising, and getting tested or screened for diseases.  Follow your health care provider's instructions on monitoring your cholesterol and blood pressure. This information is not intended to replace advice given to you by your health care provider. Make sure you discuss any questions you have with your health care provider. Document Revised: 04/10/2018 Document Reviewed: 04/10/2018 Elsevier Patient Education  2021 [employer].

## 2020-07-14 ENCOUNTER — Ambulatory Visit (INDEPENDENT_AMBULATORY_CARE_PROVIDER_SITE_OTHER): Payer: 59 | Admitting: Internal Medicine

## 2020-07-14 ENCOUNTER — Other Ambulatory Visit: Payer: Self-pay

## 2020-07-14 ENCOUNTER — Encounter: Payer: Self-pay | Admitting: Internal Medicine

## 2020-07-14 ENCOUNTER — Encounter: Payer: 59 | Admitting: Internal Medicine

## 2020-07-14 VITALS — BP 114/74 | HR 68 | Temp 98.1°F | Ht 68.0 in | Wt 181.0 lb

## 2020-07-14 DIAGNOSIS — E038 Other specified hypothyroidism: Secondary | ICD-10-CM | POA: Diagnosis not present

## 2020-07-14 DIAGNOSIS — I10 Essential (primary) hypertension: Secondary | ICD-10-CM | POA: Diagnosis not present

## 2020-07-14 DIAGNOSIS — E7800 Pure hypercholesterolemia, unspecified: Secondary | ICD-10-CM

## 2020-07-14 DIAGNOSIS — E063 Autoimmune thyroiditis: Secondary | ICD-10-CM | POA: Diagnosis not present

## 2020-07-14 DIAGNOSIS — Z0000 Encounter for general adult medical examination without abnormal findings: Secondary | ICD-10-CM | POA: Diagnosis not present

## 2020-07-14 LAB — CBC WITH DIFFERENTIAL/PLATELET
Basophils Absolute: 0.1 10*3/uL (ref 0.0–0.1)
Basophils Relative: 1.3 % (ref 0.0–3.0)
Eosinophils Absolute: 0.4 10*3/uL (ref 0.0–0.7)
Eosinophils Relative: 7.1 % — ABNORMAL HIGH (ref 0.0–5.0)
HCT: 38.9 % (ref 36.0–46.0)
Hemoglobin: 13.5 g/dL (ref 12.0–15.0)
Lymphocytes Relative: 42.2 % (ref 12.0–46.0)
Lymphs Abs: 2.3 10*3/uL (ref 0.7–4.0)
MCHC: 34.6 g/dL (ref 30.0–36.0)
MCV: 92.8 fl (ref 78.0–100.0)
Monocytes Absolute: 0.4 10*3/uL (ref 0.1–1.0)
Monocytes Relative: 7.8 % (ref 3.0–12.0)
Neutro Abs: 2.3 10*3/uL (ref 1.4–7.7)
Neutrophils Relative %: 41.6 % — ABNORMAL LOW (ref 43.0–77.0)
Platelets: 203 10*3/uL (ref 150.0–400.0)
RBC: 4.19 Mil/uL (ref 3.87–5.11)
RDW: 12.9 % (ref 11.5–15.5)
WBC: 5.5 10*3/uL (ref 4.0–10.5)

## 2020-07-14 LAB — LIPID PANEL
Cholesterol: 230 mg/dL — ABNORMAL HIGH (ref 0–200)
HDL: 54.7 mg/dL (ref >39.00)
LDL Cholesterol: 154 mg/dL — ABNORMAL HIGH (ref 0–99)
NonHDL: 175.44
Total CHOL/HDL Ratio: 4
Triglycerides: 106 mg/dL (ref 0.0–149.0)
VLDL: 21.2 mg/dL (ref 0.0–40.0)

## 2020-07-14 LAB — T3, FREE: T3, Free: 3.3 pg/mL (ref 2.3–4.2)

## 2020-07-14 LAB — T4, FREE: Free T4: 0.94 ng/dL (ref 0.60–1.60)

## 2020-07-14 LAB — COMPREHENSIVE METABOLIC PANEL
ALT: 28 U/L (ref 0–35)
AST: 20 U/L (ref 0–37)
Albumin: 4.1 g/dL (ref 3.5–5.2)
Alkaline Phosphatase: 55 U/L (ref 39–117)
BUN: 21 mg/dL (ref 6–23)
CO2: 30 mEq/L (ref 19–32)
Calcium: 9.5 mg/dL (ref 8.4–10.5)
Chloride: 103 mEq/L (ref 96–112)
Creatinine, Ser: 0.71 mg/dL (ref 0.40–1.20)
GFR: 93.19 mL/min (ref >60.00)
Glucose, Bld: 96 mg/dL (ref 70–99)
Potassium: 3.9 mEq/L (ref 3.5–5.1)
Sodium: 139 mEq/L (ref 135–145)
Total Bilirubin: 0.4 mg/dL (ref 0.2–1.2)
Total Protein: 7.1 g/dL (ref 6.0–8.3)

## 2020-07-14 LAB — TSH: TSH: 1.7 u[IU]/mL (ref 0.35–4.50)

## 2020-07-14 MED ORDER — SPIRONOLACTONE 25 MG PO TABS
ORAL_TABLET | ORAL | 3 refills | Status: DC
Start: 2020-07-14 — End: 2020-10-18

## 2020-07-14 MED ORDER — LEVOTHYROXINE SODIUM 50 MCG PO TABS
ORAL_TABLET | ORAL | 3 refills | Status: DC
Start: 2020-07-14 — End: 2021-07-18

## 2020-07-14 NOTE — Patient Instructions (Addendum)
Blood work was ordered.     Medications changes include :   none  Your prescription(s) have been submitted to your pharmacy. Please take as directed and contact our office if you believe you are having problem(s) with the medication(s).    Please followup in 1 year   Health Maintenance, Female Adopting a healthy lifestyle and getting preventive care are important in promoting health and wellness. Ask your health care provider about:  The right schedule for you to have regular tests and exams.  Things you can do on your own to prevent diseases and keep yourself healthy. What should I know about diet, weight, and exercise? Eat a healthy diet  Eat a diet that includes plenty of vegetables, fruits, low-fat dairy products, and lean protein.  Do not eat a lot of foods that are high in solid fats, added sugars, or sodium.   Maintain a healthy weight Body mass index (BMI) is used to identify weight problems. It estimates body fat based on height and weight. Your health care provider can help determine your BMI and help you achieve or maintain a healthy weight. Get regular exercise Get regular exercise. This is one of the most important things you can do for your health. Most adults should:  Exercise for at least 150 minutes each week. The exercise should increase your heart rate and make you sweat (moderate-intensity exercise).  Do strengthening exercises at least twice a week. This is in addition to the moderate-intensity exercise.  Spend less time sitting. Even light physical activity can be beneficial. Watch cholesterol and blood lipids Have your blood tested for lipids and cholesterol at 60 years of age, then have this test every 5 years. Have your cholesterol levels checked more often if:  Your lipid or cholesterol levels are high.  You are older than 60 years of age.  You are at high risk for heart disease. What should I know about cancer screening? Depending on your  health history and family history, you may need to have cancer screening at various ages. This may include screening for:  Breast cancer.  Cervical cancer.  Colorectal cancer.  Skin cancer.  Lung cancer. What should I know about heart disease, diabetes, and high blood pressure? Blood pressure and heart disease  High blood pressure causes heart disease and increases the risk of stroke. This is more likely to develop in people who have high blood pressure readings, are of African descent, or are overweight.  Have your blood pressure checked: ? Every 3-5 years if you are 18-39 years of age. ? Every year if you are 40 years old or older. Diabetes Have regular diabetes screenings. This checks your fasting blood sugar level. Have the screening done:  Once every three years after age 40 if you are at a normal weight and have a low risk for diabetes.  More often and at a younger age if you are overweight or have a high risk for diabetes. What should I know about preventing infection? Hepatitis B If you have a higher risk for hepatitis B, you should be screened for this virus. Talk with your health care provider to find out if you are at risk for hepatitis B infection. Hepatitis C Testing is recommended for:  Everyone born from 1945 through 1965.  Anyone with known risk factors for hepatitis C. Sexually transmitted infections (STIs)  Get screened for STIs, including gonorrhea and chlamydia, if: ? You are sexually active and are younger than 60 years of age. ?   You are older than 60 years of age and your health care provider tells you that you are at risk for this type of infection. ? Your sexual activity has changed since you were last screened, and you are at increased risk for chlamydia or gonorrhea. Ask your health care provider if you are at risk.  Ask your health care provider about whether you are at high risk for HIV. Your health care provider may recommend a prescription  medicine to help prevent HIV infection. If you choose to take medicine to prevent HIV, you should first get tested for HIV. You should then be tested every 3 months for as long as you are taking the medicine. Pregnancy  If you are about to stop having your period (premenopausal) and you may become pregnant, seek counseling before you get pregnant.  Take 400 to 800 micrograms (mcg) of folic acid every day if you become pregnant.  Ask for birth control (contraception) if you want to prevent pregnancy. Osteoporosis and menopause Osteoporosis is a disease in which the bones lose minerals and strength with aging. This can result in bone fractures. If you are 65 years old or older, or if you are at risk for osteoporosis and fractures, ask your health care provider if you should:  Be screened for bone loss.  Take a calcium or vitamin D supplement to lower your risk of fractures.  Be given hormone replacement therapy (HRT) to treat symptoms of menopause. Follow these instructions at home: Lifestyle  Do not use any products that contain nicotine or tobacco, such as cigarettes, e-cigarettes, and chewing tobacco. If you need help quitting, ask your health care provider.  Do not use street drugs.  Do not share needles.  Ask your health care provider for help if you need support or information about quitting drugs. Alcohol use  Do not drink alcohol if: ? Your health care provider tells you not to drink. ? You are pregnant, may be pregnant, or are planning to become pregnant.  If you drink alcohol: ? Limit how much you use to 0-1 drink a day. ? Limit intake if you are breastfeeding.  Be aware of how much alcohol is in your drink. In the [place] one drink equals one 12 oz bottle of beer (355 mL), one 5 oz glass of wine (148 mL), or one 1 oz glass of hard liquor (44 mL). General instructions  Schedule regular health, dental, and eye exams.  Stay current with your vaccines.  Tell your health  care provider if: ? You often feel depressed. ? You have ever been abused or do not feel safe at home. Summary  Adopting a healthy lifestyle and getting preventive care are important in promoting health and wellness.  Follow your health care provider's instructions about healthy diet, exercising, and getting tested or screened for diseases.  Follow your health care provider's instructions on monitoring your cholesterol and blood pressure. This information is not intended to replace advice given to you by your health care provider. Make sure you discuss any questions you have with your health care provider. Document Revised: 04/10/2018 Document Reviewed: 04/10/2018 Elsevier Patient Education  2021 [employer]  

## 2020-07-14 NOTE — Assessment & Plan Note (Addendum)
Chronic  Clinically euthyroid Currently taking levothyroxine 100 mcg twice a week, 50 mcg ROW Check tfts Titrate med dose if needed

## 2020-07-14 NOTE — Assessment & Plan Note (Signed)
Chronic BP well controlled Continue spironolactone 25 mg daily cmp

## 2020-07-14 NOTE — Assessment & Plan Note (Signed)
Chronic Low ascvd risk  Check lipid panel  Diet controlled Regular exercise and healthy diet encouraged

## 2020-07-14 NOTE — Progress Notes (Signed)
Subjective:    Patient ID: Emily Anderson, female    DOB: 09-Jul-1960, 60 y.o.   MRN: [id number]   This visit occurred during the SARS-CoV-2 public health emergency.  Safety protocols were in place, including screening questions prior to the visit, additional usage of staff PPE, and extensive cleaning of exam room while observing appropriate contact time as indicated for disinfecting solutions.    HPI She is here for a physical exam.   She does not always sleep well.  Her constipation is better.  She still has brain fog.  She has weight gain - in her abdominal area.    She feels better on her current thyroid regimen.    Her constipation is controlled with miralax.  Her sleep is better - some nights she does not sleep well.   Medications and allergies reviewed with patient and updated if appropriate.  Patient Active Problem List   Diagnosis Date Noted  . Dysphagia 07/10/2018  . Hyperglycemia 07/09/2018  . Chest pain 03/25/2018  . Palpitations 03/25/2018  . Family history of diabetes mellitus (DM) 03/25/2018  . Degenerative disc disease, lumbar 09/10/2017  . Nonallopathic lesion of lumbosacral region 06/29/2017  . Nonallopathic lesion of sacral region 06/29/2017  . Nonallopathic lesion of thoracic region 06/29/2017  . Greater trochanteric bursitis of both hips 05/28/2017  . Family history of muscular dystrophy 05/11/2017  . Pain of both hip joints 05/11/2017  . Arthralgia 05/11/2017  . S/P laparoscopic assisted vaginal hysterectomy (LAVH) 08/02/2015  . Hypothyroidism due to Hashimoto's thyroiditis 02/13/2015  . Greater trochanteric bursitis of right hip 05/19/2014  . Essential hypertension 07/19/2009  . Hypercholesterolemia 08/01/2007    Current Outpatient Medications on File Prior to Visit  Medication Sig Dispense Refill  . aspirin EC 81 MG tablet Take 81 mg by mouth daily.    . cetirizine (ZYRTEC) 10 MG tablet Take 10 mg by mouth daily.    . cholecalciferol (VITAMIN  D) 25 MCG (1000 UNIT) tablet 1 tablet    . IMVEXXY STARTER PACK 10 MCG INST Place vaginally.    Marland Kitchen levothyroxine (SYNTHROID) 50 MCG tablet TAKE 1 TABLET BY MOUTH EVERY DAY (Patient taking differently: TAKE 2 TABLES BY MOUTH EVERY Saturday AND Sunday; ONE TABLET MON-FRIDAY) 90 tablet 3  . Multiple Vitamins-Minerals (CENTRUM SILVER PO) Take 1 tablet by mouth daily.    . Omega-3 Fatty Acids (FISH OIL PO) Take 1,200 mg by mouth daily.    Marland Kitchen OVER THE COUNTER MEDICATION Vitamin D 3 1000 units daily.    Marland Kitchen OVER THE COUNTER MEDICATION Tumeric Curcumin 500 mg. One capsule daily.    Marland Kitchen OVER THE COUNTER MEDICATION Tart Cherry Extract, 1200 mg one tablet daily.    . polyethylene glycol powder (MIRALAX) 17 GM/SCOOP powder Take 17 g by mouth daily. 17 g 0  . spironolactone (ALDACTONE) 25 MG tablet TAKE 1 TABLET BY MOUTH EVERY DAY NEEDS OFFICE VISIT 90 tablet 3  . Turmeric Curcumin 500 MG CAPS See admin instructions.    . valACYclovir (VALTREX) 1000 MG tablet Take 2,000 mg by mouth 2 (two) times daily as needed.     No current facility-administered medications on file prior to visit.    Past Medical History:  Diagnosis Date  . Allergy   . Dyspepsia    food triggers  . GERD (gastroesophageal reflux disease)   . Heart murmur   . Hyperlipidemia   . Hypertension   . Hypothyroidism     Past Surgical History:  Procedure Laterality  Date  . ABLATION    . CESAREAN SECTION     G 2 P 2, both C sections  . COLONOSCOPY  2009   negative; [facility]  . LAPAROSCOPIC VAGINAL HYSTERECTOMY WITH SALPINGECTOMY Bilateral 08/02/2015   Procedure: LAPAROSCOPIC ASSISTED VAGINAL HYSTERECTOMY WITH SALPINGECTOMY;  Surgeon: Paula Compton, MD;  Location: [facility];  Service: Gynecology;  Laterality: Bilateral;    Social History   Socioeconomic History  . Marital status: Married    Spouse name: Not on file  . Number of children: Not on file  . Years of education: Not on file  . Highest education level: Not on file   Occupational History  . Occupation: Best boy: [employer]  Tobacco Use  . Smoking status: Never Smoker  . Smokeless tobacco: Never Used  Substance and Sexual Activity  . Alcohol use: No  . Drug use: No  . Sexual activity: Yes  Other Topics Concern  . Not on file  Social History Narrative   REG EXERCISE..   LOW CARB LOW NA DIET   Social Determinants of Health   Financial Resource Strain: Not on file  Food Insecurity: Not on file  Transportation Needs: Not on file  Physical Activity: Not on file  Stress: Not on file  Social Connections: Not on file    Family History  Problem Relation Age of Onset  . Thyroid cancer Mother   . Prostate cancer Father   . Heart disease Father 96       angioplasty   . Colon polyps Father   . Diabetes Father   . Other Father 43       carcinoid tumor   . Colon cancer Father   . Diabetes Paternal Aunt        1  . Diabetes Paternal Uncle         2  . Colon polyps Sister   . Muscular dystrophy Sister   . Stroke Neg Hx   . Esophageal cancer Neg Hx   . Liver cancer Neg Hx   . Pancreatic cancer Neg Hx   . Rectal cancer Neg Hx   . Stomach cancer Neg Hx     Review of Systems  Constitutional: Negative for chills and fever.  Eyes: Negative for visual disturbance.  Respiratory: Negative for cough, shortness of breath and wheezing.   Cardiovascular: Positive for palpitations (occ). Negative for chest pain and leg swelling.  Gastrointestinal: Negative for abdominal pain, blood in stool, constipation, diarrhea and nausea.       Occ gerd  Genitourinary: Negative for dysuria and hematuria.  Musculoskeletal: Positive for back pain (intermittent flares). Negative for arthralgias.  Skin: Negative for rash.  Neurological: Negative for light-headedness and headaches.  Psychiatric/Behavioral: Negative for dysphoric mood. The patient is not nervous/anxious.        Objective:   Vitals:   07/14/20 0752  BP:  114/74  Pulse: 68  Temp: 98.1 F (36.7 C)  SpO2: 97%   Filed Weights   07/14/20 0752  Weight: 181 lb (82.1 kg)   Body mass index is 27.52 kg/m.  BP Readings from Last 3 Encounters:  07/14/20 114/74  10/01/19 118/60  08/19/19 (!) 110/56    Wt Readings from Last 3 Encounters:  07/14/20 181 lb (82.1 kg)  10/01/19 174 lb (78.9 kg)  08/19/19 174 lb (78.9 kg)     Physical Exam Constitutional: She appears well-developed and well-nourished. No distress.  HENT:  Head: Normocephalic and atraumatic.  Right Ear: External ear normal. Normal ear canal and TM Left Ear: External ear normal.  Normal ear canal and TM Mouth/Throat: Oropharynx is clear and moist.  Eyes: Conjunctivae and EOM are normal.  Neck: Neck supple. No tracheal deviation present. No thyromegaly present.  No carotid bruit  Cardiovascular: Normal rate, regular rhythm and normal heart sounds.   No murmur heard.  No edema. Pulmonary/Chest: Effort normal and breath sounds normal. No respiratory distress. She has no wheezes. She has no rales.  Breast: deferred   Abdominal: Soft. She exhibits no distension. There is no tenderness.  Lymphadenopathy: She has no cervical adenopathy.  Skin: Skin is warm and dry. She is not diaphoretic.  Psychiatric: She has a normal mood and affect. Her behavior is normal.   The 10-year ASCVD risk score Mikey Bussing DC Jr., et al., 2013) is: 3.6%   Values used to calculate the score:     Age: 64 years     Sex: Female     Is Non-Hispanic African American: No     Diabetic: No     Tobacco smoker: No     Systolic Blood Pressure: 196 mmHg     Is BP treated: Yes     HDL Cholesterol: 50.4 mg/dL     Total Cholesterol: 219 mg/dL      Assessment & Plan:   Physical exam: Screening blood work    ordered Immunizations  deferred Flu, had covid booster, discussed shingrix Colonoscopy  Up to date Mammogram  Up to date  Gyn  Up to date - [facility] exams  Up to date  Exercise  Walking  30-45 min Weight  Mildly overweight Substance abuse  none Sees derm anually     See Problem List for Assessment and Plan of chronic medical problems.

## 2020-10-18 ENCOUNTER — Other Ambulatory Visit: Payer: Self-pay | Admitting: Internal Medicine

## 2020-12-02 NOTE — Progress Notes (Signed)
Subjective:    Patient ID: Emily Anderson, female    DOB: 1960/05/19, 60 y.o.   MRN: [id number]  HPI The patient is here for follow up of vertigo  7/28 ED at [facility] for dizziness, N/V, headache.  No neuro deficits.  It was positional.  Similar episode a few weeks prior that resolved on its own.  Thought to be peripheral vertigo.  CT/ CTA head w/o acute abnormality.  Prescribed meclizine 25 mg tid prn.   She was dizzy and had a headache for a couple of days.  Each day after that it got better slowly.  Now she still feels a little dizzy with quick movements.  Her head is stuffy.  She takes zyrtec daily.  She feels a little foggy.  She did not tolerate the meclizine due to drowsiness.   Some of her episodes have been lightheadedness.  Her blood pressure has been low at home.  Medications and allergies reviewed with patient and updated if appropriate.  Patient Active Problem List   Diagnosis Date Noted   Vertigo 12/03/2020   Dysphagia 07/10/2018   Hyperglycemia 07/09/2018   Chest pain 03/25/2018   Palpitations 03/25/2018   Family history of diabetes mellitus (DM) 03/25/2018   Degenerative disc disease, lumbar 09/10/2017   Nonallopathic lesion of lumbosacral region 06/29/2017   Nonallopathic lesion of sacral region 06/29/2017   Nonallopathic lesion of thoracic region 06/29/2017   Greater trochanteric bursitis of both hips 05/28/2017   Family history of muscular dystrophy 05/11/2017   Pain of both hip joints 05/11/2017   Arthralgia 05/11/2017   S/P laparoscopic assisted vaginal hysterectomy (LAVH) 08/02/2015   Hypothyroidism due to Hashimoto's thyroiditis 02/13/2015   Greater trochanteric bursitis of right hip 05/19/2014   Essential hypertension 07/19/2009   Hypercholesterolemia 08/01/2007    Current Outpatient Medications on File Prior to Visit  Medication Sig Dispense Refill   aspirin EC 81 MG tablet Take 81 mg by mouth daily.     cetirizine (ZYRTEC) 10 MG tablet  Take 10 mg by mouth daily.     cholecalciferol (VITAMIN D) 25 MCG (1000 UNIT) tablet 1 tablet     IMVEXXY STARTER PACK 10 MCG INST Place vaginally.     levothyroxine (SYNTHROID) 50 MCG tablet TAKE 2 TABLES BY MOUTH EVERY Saturday AND Sunday; ONE TABLET MON-FRIDAY 116 tablet 3   Multiple Vitamins-Minerals (CENTRUM SILVER PO) Take 1 tablet by mouth daily.     Omega-3 Fatty Acids (FISH OIL PO) Take 1,200 mg by mouth daily.     OVER THE COUNTER MEDICATION Vitamin D 3 1000 units daily.     OVER THE COUNTER MEDICATION Tumeric Curcumin 500 mg. One capsule daily.     OVER THE COUNTER MEDICATION Tart Cherry Extract, 1200 mg one tablet daily.     polyethylene glycol powder (MIRALAX) 17 GM/SCOOP powder Take 17 g by mouth daily. 17 g 0   Turmeric Curcumin 500 MG CAPS See admin instructions.     valACYclovir (VALTREX) 1000 MG tablet Take 2,000 mg by mouth 2 (two) times daily as needed.     No current facility-administered medications on file prior to visit.    Past Medical History:  Diagnosis Date   Allergy    Dyspepsia    food triggers   GERD (gastroesophageal reflux disease)    Heart murmur    Hyperlipidemia    Hypertension    Hypothyroidism     Past Surgical History:  Procedure Laterality Date   ABLATION  CESAREAN SECTION     G 2 P 2, both C sections   COLONOSCOPY  2009   negative; [facility]   LAPAROSCOPIC VAGINAL HYSTERECTOMY WITH SALPINGECTOMY Bilateral 08/02/2015   Procedure: LAPAROSCOPIC ASSISTED VAGINAL HYSTERECTOMY WITH SALPINGECTOMY;  Surgeon: Paula Compton, MD;  Location: [facility];  Service: Gynecology;  Laterality: Bilateral;    Social History   Socioeconomic History   Marital status: Married    Spouse name: Not on file   Number of children: Not on file   Years of education: Not on file   Highest education level: Not on file  Occupational History   Occupation: MANAGER    Employer: [employer]  Tobacco Use   Smoking status: Never   Smokeless  tobacco: Never  Substance and Sexual Activity   Alcohol use: No   Drug use: No   Sexual activity: Yes  Other Topics Concern   Not on file  Social History Narrative   REG EXERCISE..   LOW CARB LOW NA DIET   Social Determinants of Health   Financial Resource Strain: Not on file  Food Insecurity: Not on file  Transportation Needs: Not on file  Physical Activity: Not on file  Stress: Not on file  Social Connections: Not on file    Family History  Problem Relation Age of Onset   Thyroid cancer Mother    Prostate cancer Father    Heart disease Father 38       angioplasty    Colon polyps Father    Diabetes Father    Other Father 70       carcinoid tumor    Colon cancer Father    Diabetes Paternal Aunt        1   Diabetes Paternal Uncle         2   Colon polyps Sister    Muscular dystrophy Sister    Stroke Neg Hx    Esophageal cancer Neg Hx    Liver cancer Neg Hx    Pancreatic cancer Neg Hx    Rectal cancer Neg Hx    Stomach cancer Neg Hx     Review of Systems  Constitutional:  Negative for chills and fever.  HENT:  Positive for congestion (allergy related).   Eyes:  Negative for visual disturbance (none since 7/28).  Cardiovascular:  Negative for chest pain and palpitations.  Gastrointestinal:  Negative for nausea (none since 7/28).  Neurological:  Positive for dizziness, light-headedness and headaches (frontal - come and go - intense - has had those in the past with allergies). Negative for weakness and numbness.      Objective:   Vitals:   12/03/20 0848  BP: 102/72  Pulse: 70  Temp: 98.4 F (36.9 C)  SpO2: 97%   BP Readings from Last 3 Encounters:  12/03/20 102/72  07/14/20 114/74  10/01/19 118/60   Wt Readings from Last 3 Encounters:  12/03/20 178 lb (80.7 kg)  07/14/20 181 lb (82.1 kg)  10/01/19 174 lb (78.9 kg)   Body mass index is 27.06 kg/m.   Physical Exam Constitutional:      General: She is not in acute distress.    Appearance:  Normal appearance. She is not ill-appearing.  HENT:     Head: Normocephalic and atraumatic.     Right Ear: Tympanic membrane, ear canal and external ear normal. There is no impacted cerumen.     Left Ear: Tympanic membrane, ear canal and external ear normal. There is no  impacted cerumen.  Eyes:     Conjunctiva/sclera: Conjunctivae normal.  Cardiovascular:     Rate and Rhythm: Normal rate and regular rhythm.     Heart sounds: No murmur heard. Pulmonary:     Effort: No respiratory distress.     Breath sounds: No wheezing or rales.  Musculoskeletal:     Right lower leg: No edema.     Left lower leg: No edema.  Skin:    General: Skin is warm and dry.  Neurological:     General: No focal deficit present.     Mental Status: She is alert and oriented to person, place, and time.     Sensory: No sensory deficit.     Motor: No weakness.  Psychiatric:        Mood and Affect: Mood normal.         Assessment & Plan:    See Problem List for Assessment and Plan of chronic medical problems.    This visit occurred during the SARS-CoV-2 public health emergency.  Safety protocols were in place, including screening questions prior to the visit, additional usage of staff PPE, and extensive cleaning of exam room while observing appropriate contact time as indicated for disinfecting solutions.

## 2020-12-03 ENCOUNTER — Ambulatory Visit: Payer: 59 | Admitting: Internal Medicine

## 2020-12-03 ENCOUNTER — Other Ambulatory Visit: Payer: Self-pay

## 2020-12-03 ENCOUNTER — Encounter: Payer: Self-pay | Admitting: Internal Medicine

## 2020-12-03 DIAGNOSIS — I10 Essential (primary) hypertension: Secondary | ICD-10-CM | POA: Diagnosis not present

## 2020-12-03 DIAGNOSIS — R42 Dizziness and giddiness: Secondary | ICD-10-CM | POA: Diagnosis not present

## 2020-12-03 MED ORDER — SPIRONOLACTONE 25 MG PO TABS
12.5000 mg | ORAL_TABLET | Freq: Every day | ORAL | 3 refills | Status: DC
Start: 2020-12-03 — End: 2021-07-18

## 2020-12-03 NOTE — Assessment & Plan Note (Signed)
Chronic BP on low side here and consistently at home Decrease spironolactone to 12.5 mg daily  Monitor bp at home

## 2020-12-03 NOTE — Patient Instructions (Addendum)
     Medications changes include :   decrease spironolactone to 12.5 mg daily    Vertigo Vertigo is the feeling that you or the things around you are moving when they are not. This feeling can come and go at any time. Vertigo often goes away on its own. This condition can be dangerous if it happens when you are doingactivities like driving or working with machines. Your doctor will do tests to find the cause of your vertigo. These tests willalso help your doctor decide on the best treatment for you. Follow these instructions at home: Eating and drinking     Drink enough fluid to keep your pee (urine) pale yellow. Do not drink alcohol. Activity Return to your normal activities when your doctor says that it is safe. In the morning, first sit up on the side of the bed. When you feel okay, stand slowly while you hold onto something until you know that your balance is fine. Move slowly. Avoid sudden body or head movements or certain positions, as told by your doctor. Use a cane if you have trouble standing or walking. Sit down right away if you feel dizzy. Avoid doing any tasks or activities that can cause danger to you or others if you get dizzy. Avoid bending down if you feel dizzy. Place items in your home so that they are easy for you to reach without bending or leaning over. Do not drive or use machinery if you feel dizzy. General instructions Take over-the-counter and prescription medicines only as told by your doctor. Keep all follow-up visits. Contact a doctor if: Your medicine does not help your vertigo. Your problems get worse or you have new symptoms. You have a fever. You feel like you may vomit (nauseous), or this feeling gets worse. You start to vomit. Your family or friends see changes in how you act. You lose feeling (have numbness) in part of your body. You feel prickling and tingling in a part of your body. Get help right away if: You are always dizzy. You  faint. You get very bad headaches. You get a stiff neck. Bright light starts to bother you. You have trouble moving or talking. You feel weak in your hands, arms, or legs. You have changes in your hearing or in how you see (vision). These symptoms may be an emergency. Get help right away. Call your local emergency services (911 in the [place] Do not wait to see if the symptoms will go away. Do not drive yourself to the hospital. Summary Vertigo is the feeling that you or the things around you are moving when they are not. Your doctor will do tests to find the cause of your vertigo. You may be told to avoid some tasks, positions, or movements. Contact a doctor if your medicine is not helping, or if you have a fever, new symptoms, or a change in how you act. Get help right away if you get very bad headaches, or if you have changes in how you speak, hear, or see. This information is not intended to replace advice given to you by your health care provider. Make sure you discuss any questions you have with your healthcare provider. Document Revised: 03/17/2020 Document Reviewed: 03/17/2020 Elsevier Patient Education  2022 [employer].

## 2020-12-03 NOTE — Assessment & Plan Note (Addendum)
Acute CT/CTA in ED negative for stroke Symptoms are positional Symptoms are improving and I expect will continue to improve and she will let me know if they do not Discussed symptomatic treatment-she has done some of the simple exercises.  Discussed trying Epley maneuver at home.  Can refer to vestibular PT if needed Can try cutting meclizine in half-can also just take at night Can try over-the-counter nondrowsy Dramamine Advised to keep hydrated If her symptoms do not continue to improve or if she has any question she will let me know Continue Zyrtec daily

## 2021-07-17 ENCOUNTER — Encounter: Payer: Self-pay | Admitting: Internal Medicine

## 2021-07-17 NOTE — Progress Notes (Signed)
? ? ?Subjective:  ? ? Patient ID: Emily Anderson, female    DOB: [birth year], 61 y.o.   MRN: [id number] ? ? ?This visit occurred during the SARS-CoV-2 public health emergency.  Safety protocols were in place, including screening questions prior to the visit, additional usage of staff PPE, and extensive cleaning of exam room while observing appropriate contact time as indicated for disinfecting solutions. ? ? ? ?HPI ?Tiffanni is here for  ?Chief Complaint  ?Patient presents with  ? Annual Exam  ? ? ?Still brain fog and belly fat.   Less fatigue than last year.  No longer experiencing the constipation. ? ?She has discussed with her gynecologist in the past about hormones because of the brain fog and she is now seriously thinking about it because she wonders if the brain fog is related to menopause.  She does have difficulty remembering things, especially at work. ? ? ? ?Medications and allergies reviewed with patient and updated if appropriate. ? ? ? ?Current Outpatient Medications on File Prior to Visit  ?Medication Sig Dispense Refill  ? aspirin EC 81 MG tablet Take 81 mg by mouth daily.    ? cetirizine (ZYRTEC) 10 MG tablet Take 10 mg by mouth daily.    ? cholecalciferol (VITAMIN D) 25 MCG (1000 UNIT) tablet 1 tablet    ? IMVEXXY STARTER PACK 10 MCG INST Place vaginally.    ? Multiple Vitamins-Minerals (CENTRUM SILVER PO) Take 1 tablet by mouth daily.    ? Omega-3 Fatty Acids (FISH OIL PO) Take 1,200 mg by mouth daily.    ? OVER THE COUNTER MEDICATION Tumeric Curcumin 500 mg. One capsule daily.    ? OVER THE COUNTER MEDICATION Tart Cherry Extract, 1200 mg one tablet daily.    ? polyethylene glycol powder (MIRALAX) 17 GM/SCOOP powder Take 17 g by mouth daily. 17 g 0  ? valACYclovir (VALTREX) 1000 MG tablet Take 2,000 mg by mouth 2 (two) times daily as needed.    ? ?No current facility-administered medications on file prior to visit.  ? ? ?Review of Systems  ?Constitutional:  Negative for fever.  ?Eyes:  Negative for  visual disturbance.  ?Respiratory:  Negative for cough, shortness of breath and wheezing.   ?Cardiovascular:  Negative for chest pain, palpitations and leg swelling.  ?Gastrointestinal:  Negative for abdominal pain, blood in stool, constipation, diarrhea and nausea.  ?     Occ gerd  ?Genitourinary:  Negative for dysuria.  ?Musculoskeletal:  Negative for arthralgias (mild) and back pain.  ?Skin:  Negative for rash.  ?Neurological:  Positive for light-headedness (a couple of episodes). Negative for headaches.  ?Psychiatric/Behavioral:  Negative for dysphoric mood. The patient is not nervous/anxious.   ? ?   ?Objective:  ? ?Vitals:  ? 07/18/21 0810  ?BP: 110/78  ?Pulse: 60  ?Temp: 98.2 ?F (36.8 ?C)  ?SpO2: 98%  ? ?Filed Weights  ? 07/18/21 0810  ?Weight: 180 lb (81.6 kg)  ? ?Body mass index is 27.37 kg/m?. ? ?BP Readings from Last 3 Encounters:  ?07/18/21 110/78  ?12/03/20 102/72  ?07/14/20 114/74  ? ? ?Wt Readings from Last 3 Encounters:  ?07/18/21 180 lb (81.6 kg)  ?12/03/20 178 lb (80.7 kg)  ?07/14/20 181 lb (82.1 kg)  ? ? ?Depression screen [facility] 2/9 07/18/2021 07/14/2020 07/14/2019 07/10/2018 05/11/2017  ?Decreased Interest 0 0 0 0 0  ?Down, Depressed, Hopeless 0 0 0 0 0  ?PHQ - 2 Score 0 0 0 0 0  ?Altered sleeping -  2 - - -  ?Tired, decreased energy - 2 - - -  ?Change in appetite - 0 - - -  ?Feeling bad or failure about yourself  - 0 - - -  ?Trouble concentrating - 0 - - -  ?Moving slowly or fidgety/restless - 0 - - -  ?Suicidal thoughts - 0 - - -  ?PHQ-9 Score - 4 - - -  ?Difficult doing work/chores - Not difficult at all - - -  ? ? ? ?GAD 7 : Generalized Anxiety Score 07/14/2020  ?Nervous, Anxious, on Edge 0  ?Control/stop worrying 0  ?Worry too much - different things 0  ?Trouble relaxing 0  ?Restless 0  ?Easily annoyed or irritable 0  ?Afraid - awful might happen 0  ?Total GAD 7 Score 0  ? ? ? ? ?  ?Physical Exam ?Constitutional: She appears well-developed and well-nourished. No distress.  ?HENT:  ?Head:  Normocephalic and atraumatic.  ?Right Ear: External ear normal. Normal ear canal and TM ?Left Ear: External ear normal.  Normal ear canal and TM ?Mouth/Throat: Oropharynx is clear and moist.  ?Eyes: Conjunctivae and EOM are normal.  ?Neck: Neck supple. No tracheal deviation present. No thyromegaly present.  ?No carotid bruit  ?Cardiovascular: Normal rate, regular rhythm and normal heart sounds.   ?No murmur heard.  No edema. ?Pulmonary/Chest: Effort normal and breath sounds normal. No respiratory distress. She has no wheezes. She has no rales.  ?Breast: deferred   ?Abdominal: Soft. She exhibits no distension. There is no tenderness.  ?Lymphadenopathy: She has no cervical adenopathy.  ?Skin: Skin is warm and dry. She is not diaphoretic.  ?Psychiatric: She has a normal mood and affect. Her behavior is normal.  ? ? ? ?Lab Results  ?Component Value Date  ? WBC 6.0 07/18/2021  ? HGB 14.0 07/18/2021  ? HCT 41.5 07/18/2021  ? PLT 213.0 07/18/2021  ? GLUCOSE 105 (H) 07/18/2021  ? CHOL 228 (H) 07/18/2021  ? TRIG 134.0 07/18/2021  ? HDL 57.80 07/18/2021  ? LDLDIRECT 171.0 08/23/2015  ? LDLCALC 143 (H) 07/18/2021  ? ALT 34 07/18/2021  ? AST 24 07/18/2021  ? NA 139 07/18/2021  ? K 4.2 07/18/2021  ? CL 104 07/18/2021  ? CREATININE 0.77 07/18/2021  ? BUN 15 07/18/2021  ? CO2 28 07/18/2021  ? TSH 1.70 07/18/2021  ? HGBA1C 5.8 07/18/2021  ? ? ? ? ?   ?Assessment & Plan:  ? ?Physical exam: ?Screening blood work  ordered ?Exercise  not regular - will start again. ?Weight  mildly overweight ?Substance abuse  none ? ? ?Reviewed recommended immunizations.  Shingrix 1 today ? ? ?Health Maintenance  ?Topic Date Due  ? Zoster Vaccines- Shingrix (2 of 2) 09/12/2021  ? MAMMOGRAM  06/07/2022  ? TETANUS/TDAP  05/15/2023  ? PAP SMEAR-Modifier  06/07/2025  ? COLONOSCOPY (Pts 45-59yr Insurance coverage will need to be confirmed)  09/01/2027  ? INFLUENZA VACCINE  Completed  ? Hepatitis C Screening  Completed  ? HIV Screening  Completed  ? HPV  VACCINES  Aged Out  ? COVID-19 Vaccine  Discontinued  ?  ? ? ? ? ? ? ?See Problem List for Assessment and Plan of chronic medical problems. ? ? ? ? ?

## 2021-07-17 NOTE — Patient Instructions (Addendum)
? ? ? ?Blood work was ordered.   ? ? ?Medications changes include :   none ? ? ?Your prescription(s) have been sent to your pharmacy.  ? ? ?Return in about 1 year (around 07/19/2022) for CPE. ? ? ?Health Maintenance, Female ?Adopting a healthy lifestyle and getting preventive care are important in promoting health and wellness. Ask your health care provider about: ?The right schedule for you to have regular tests and exams. ?Things you can do on your own to prevent diseases and keep yourself healthy. ?What should I know about diet, weight, and exercise? ?Eat a healthy diet ? ?Eat a diet that includes plenty of vegetables, fruits, low-fat dairy products, and lean protein. ?Do not eat a lot of foods that are high in solid fats, added sugars, or sodium. ?Maintain a healthy weight ?Body mass index (BMI) is used to identify weight problems. It estimates body fat based on height and weight. Your health care provider can help determine your BMI and help you achieve or maintain a healthy weight. ?Get regular exercise ?Get regular exercise. This is one of the most important things you can do for your health. Most adults should: ?Exercise for at least 150 minutes each week. The exercise should increase your heart rate and make you sweat (moderate-intensity exercise). ?Do strengthening exercises at least twice a week. This is in addition to the moderate-intensity exercise. ?Spend less time sitting. Even light physical activity can be beneficial. ?Watch cholesterol and blood lipids ?Have your blood tested for lipids and cholesterol at 61 years of age, then have this test every 5 years. ?Have your cholesterol levels checked more often if: ?Your lipid or cholesterol levels are high. ?You are older than 61 years of age. ?You are at high risk for heart disease. ?What should I know about cancer screening? ?Depending on your health history and family history, you may need to have cancer screening at various ages. This may include  screening for: ?Breast cancer. ?Cervical cancer. ?Colorectal cancer. ?Skin cancer. ?Lung cancer. ?What should I know about heart disease, diabetes, and high blood pressure? ?Blood pressure and heart disease ?High blood pressure causes heart disease and increases the risk of stroke. This is more likely to develop in people who have high blood pressure readings or are overweight. ?Have your blood pressure checked: ?Every 3-5 years if you are 41-83 years of age. ?Every year if you are 60 years old or older. ?Diabetes ?Have regular diabetes screenings. This checks your fasting blood sugar level. Have the screening done: ?Once every three years after age 36 if you are at a normal weight and have a low risk for diabetes. ?More often and at a younger age if you are overweight or have a high risk for diabetes. ?What should I know about preventing infection? ?Hepatitis B ?If you have a higher risk for hepatitis B, you should be screened for this virus. Talk with your health care provider to find out if you are at risk for hepatitis B infection. ?Hepatitis C ?Testing is recommended for: ?Everyone born from [birth year] through 1965. ?Anyone with known risk factors for hepatitis C. ?Sexually transmitted infections (STIs) ?Get screened for STIs, including gonorrhea and chlamydia, if: ?You are sexually active and are younger than 61 years of age. ?You are older than 61 years of age and your health care provider tells you that you are at risk for this type of infection. ?Your sexual activity has changed since you were last screened, and you are at increased  risk for chlamydia or gonorrhea. Ask your health care provider if you are at risk. ?Ask your health care provider about whether you are at high risk for HIV. Your health care provider may recommend a prescription medicine to help prevent HIV infection. If you choose to take medicine to prevent HIV, you should first get tested for HIV. You should then be tested every 3 months for as  long as you are taking the medicine. ?Pregnancy ?If you are about to stop having your period (premenopausal) and you may become pregnant, seek counseling before you get pregnant. ?Take 400 to 800 micrograms (mcg) of folic acid every day if you become pregnant. ?Ask for birth control (contraception) if you want to prevent pregnancy. ?Osteoporosis and menopause ?Osteoporosis is a disease in which the bones lose minerals and strength with aging. This can result in bone fractures. If you are 87 years old or older, or if you are at risk for osteoporosis and fractures, ask your health care provider if you should: ?Be screened for bone loss. ?Take a calcium or vitamin D supplement to lower your risk of fractures. ?Be given hormone replacement therapy (HRT) to treat symptoms of menopause. ?Follow these instructions at home: ?Alcohol use ?Do not drink alcohol if: ?Your health care provider tells you not to drink. ?You are pregnant, may be pregnant, or are planning to become pregnant. ?If you drink alcohol: ?Limit how much you have to: ?0-1 drink a day. ?Know how much alcohol is in your drink. In the [place] one drink equals one 12 oz bottle of beer (355 mL), one 5 oz glass of wine (148 mL), or one 1? oz glass of hard liquor (44 mL). ?Lifestyle ?Do not use any products that contain nicotine or tobacco. These products include cigarettes, chewing tobacco, and vaping devices, such as e-cigarettes. If you need help quitting, ask your health care provider. ?Do not use street drugs. ?Do not share needles. ?Ask your health care provider for help if you need support or information about quitting drugs. ?General instructions ?Schedule regular health, dental, and eye exams. ?Stay current with your vaccines. ?Tell your health care provider if: ?You often feel depressed. ?You have ever been abused or do not feel safe at home. ?Summary ?Adopting a healthy lifestyle and getting preventive care are important in promoting health and  wellness. ?Follow your health care provider's instructions about healthy diet, exercising, and getting tested or screened for diseases. ?Follow your health care provider's instructions on monitoring your cholesterol and blood pressure. ?This information is not intended to replace advice given to you by your health care provider. Make sure you discuss any questions you have with your health care provider. ?Document Revised: 09/06/2020 Document Reviewed: 09/06/2020 ?Elsevier Patient Education ? [place]. ? ?

## 2021-07-18 ENCOUNTER — Ambulatory Visit (INDEPENDENT_AMBULATORY_CARE_PROVIDER_SITE_OTHER): Payer: 59 | Admitting: Internal Medicine

## 2021-07-18 ENCOUNTER — Other Ambulatory Visit: Payer: Self-pay

## 2021-07-18 ENCOUNTER — Encounter: Payer: Self-pay | Admitting: Internal Medicine

## 2021-07-18 VITALS — BP 110/78 | HR 60 | Temp 98.2°F | Ht 68.0 in | Wt 180.0 lb

## 2021-07-18 DIAGNOSIS — R739 Hyperglycemia, unspecified: Secondary | ICD-10-CM | POA: Diagnosis not present

## 2021-07-18 DIAGNOSIS — E038 Other specified hypothyroidism: Secondary | ICD-10-CM | POA: Diagnosis not present

## 2021-07-18 DIAGNOSIS — Z23 Encounter for immunization: Secondary | ICD-10-CM

## 2021-07-18 DIAGNOSIS — E7800 Pure hypercholesterolemia, unspecified: Secondary | ICD-10-CM | POA: Diagnosis not present

## 2021-07-18 DIAGNOSIS — I10 Essential (primary) hypertension: Secondary | ICD-10-CM

## 2021-07-18 DIAGNOSIS — Z0000 Encounter for general adult medical examination without abnormal findings: Secondary | ICD-10-CM

## 2021-07-18 DIAGNOSIS — R4189 Other symptoms and signs involving cognitive functions and awareness: Secondary | ICD-10-CM

## 2021-07-18 DIAGNOSIS — E063 Autoimmune thyroiditis: Secondary | ICD-10-CM

## 2021-07-18 LAB — LIPID PANEL
Cholesterol: 228 mg/dL — ABNORMAL HIGH (ref 0–200)
HDL: 57.8 mg/dL (ref >39.00)
LDL Cholesterol: 143 mg/dL — ABNORMAL HIGH (ref 0–99)
NonHDL: 170.28
Total CHOL/HDL Ratio: 4
Triglycerides: 134 mg/dL (ref 0.0–149.0)
VLDL: 26.8 mg/dL (ref 0.0–40.0)

## 2021-07-18 LAB — CBC WITH DIFFERENTIAL/PLATELET
Basophils Absolute: 0.1 10*3/uL (ref 0.0–0.1)
Basophils Relative: 1.3 % (ref 0.0–3.0)
Eosinophils Absolute: 0.5 10*3/uL (ref 0.0–0.7)
Eosinophils Relative: 8.7 % — ABNORMAL HIGH (ref 0.0–5.0)
HCT: 41.5 % (ref 36.0–46.0)
Hemoglobin: 14 g/dL (ref 12.0–15.0)
Lymphocytes Relative: 43.8 % (ref 12.0–46.0)
Lymphs Abs: 2.6 10*3/uL (ref 0.7–4.0)
MCHC: 33.8 g/dL (ref 30.0–36.0)
MCV: 94.2 fl (ref 78.0–100.0)
Monocytes Absolute: 0.5 10*3/uL (ref 0.1–1.0)
Monocytes Relative: 8.2 % (ref 3.0–12.0)
Neutro Abs: 2.3 10*3/uL (ref 1.4–7.7)
Neutrophils Relative %: 38 % — ABNORMAL LOW (ref 43.0–77.0)
Platelets: 213 10*3/uL (ref 150.0–400.0)
RBC: 4.4 Mil/uL (ref 3.87–5.11)
RDW: 13.2 % (ref 11.5–15.5)
WBC: 6 10*3/uL (ref 4.0–10.5)

## 2021-07-18 LAB — COMPREHENSIVE METABOLIC PANEL
ALT: 34 U/L (ref 0–35)
AST: 24 U/L (ref 0–37)
Albumin: 4.5 g/dL (ref 3.5–5.2)
Alkaline Phosphatase: 66 U/L (ref 39–117)
BUN: 15 mg/dL (ref 6–23)
CO2: 28 mEq/L (ref 19–32)
Calcium: 9.5 mg/dL (ref 8.4–10.5)
Chloride: 104 mEq/L (ref 96–112)
Creatinine, Ser: 0.77 mg/dL (ref 0.40–1.20)
GFR: 83.95 mL/min (ref >60.00)
Glucose, Bld: 105 mg/dL — ABNORMAL HIGH (ref 70–99)
Potassium: 4.2 mEq/L (ref 3.5–5.1)
Sodium: 139 mEq/L (ref 135–145)
Total Bilirubin: 0.5 mg/dL (ref 0.2–1.2)
Total Protein: 7.3 g/dL (ref 6.0–8.3)

## 2021-07-18 LAB — T4, FREE: Free T4: 1 ng/dL (ref 0.60–1.60)

## 2021-07-18 LAB — TSH: TSH: 1.7 u[IU]/mL (ref 0.35–5.50)

## 2021-07-18 LAB — T3, FREE: T3, Free: 3 pg/mL (ref 2.3–4.2)

## 2021-07-18 LAB — HEMOGLOBIN A1C: Hgb A1c MFr Bld: 5.8 % (ref 4.6–6.5)

## 2021-07-18 MED ORDER — LEVOTHYROXINE SODIUM 50 MCG PO TABS: ORAL_TABLET | ORAL | 3 refills | Status: DC

## 2021-07-18 MED ORDER — SPIRONOLACTONE 25 MG PO TABS: 12.5000 mg | ORAL_TABLET | Freq: Every day | ORAL | 3 refills | Status: DC

## 2021-07-18 NOTE — Assessment & Plan Note (Signed)
Chronic ?Has been going on for a while ?For a while has thought it was related to her thyroid, but does not seem to be related to her thyroid-discussed her thyroid has been in the normal range and at a good place in the normal range ?We will recheck TFTs today ?Possibly related to menopause, which her gynecologist agrees with ?May consider starting hormone therapy to see if that helps ?

## 2021-07-18 NOTE — Assessment & Plan Note (Signed)
Chronic ?Blood pressure well controlled ?CMP ?Continue spironolactone 12.5 mg daily ?

## 2021-07-18 NOTE — Assessment & Plan Note (Signed)
Chronic  ?Clinically euthyroid ?Currently taking levothyroxine 50 mcg 5 days a week, 100 mcg 2 days a week ?Check tsh  ?Titrate med dose if needed ?

## 2021-07-18 NOTE — Assessment & Plan Note (Signed)
Chronic ?Family history of diabetes ?Check a1c ?Low sugar / carb diet ?Stressed regular exercise ?

## 2021-07-18 NOTE — Assessment & Plan Note (Signed)
Chronic °Regular exercise and healthy diet encouraged °Check lipid panel  °Lifestyle controlled °

## 2021-07-19 ENCOUNTER — Encounter: Payer: Self-pay | Admitting: Internal Medicine

## 2021-09-28 LAB — HM MAMMOGRAPHY

## 2021-10-17 ENCOUNTER — Encounter: Payer: Self-pay | Admitting: Internal Medicine

## 2021-10-17 DIAGNOSIS — R7303 Prediabetes: Secondary | ICD-10-CM

## 2021-10-17 DIAGNOSIS — E7800 Pure hypercholesterolemia, unspecified: Secondary | ICD-10-CM

## 2021-10-19 ENCOUNTER — Ambulatory Visit (INDEPENDENT_AMBULATORY_CARE_PROVIDER_SITE_OTHER): Payer: 59 | Admitting: *Deleted

## 2021-10-19 ENCOUNTER — Other Ambulatory Visit (INDEPENDENT_AMBULATORY_CARE_PROVIDER_SITE_OTHER): Payer: 59

## 2021-10-19 DIAGNOSIS — E7800 Pure hypercholesterolemia, unspecified: Secondary | ICD-10-CM

## 2021-10-19 DIAGNOSIS — R7303 Prediabetes: Secondary | ICD-10-CM | POA: Diagnosis not present

## 2021-10-19 DIAGNOSIS — Z23 Encounter for immunization: Secondary | ICD-10-CM | POA: Diagnosis not present

## 2021-10-19 LAB — LIPID PANEL
Cholesterol: 231 mg/dL — ABNORMAL HIGH (ref 0–200)
HDL: 58.8 mg/dL (ref >39.00)
LDL Cholesterol: 157 mg/dL — ABNORMAL HIGH (ref 0–99)
NonHDL: 171.88
Total CHOL/HDL Ratio: 4
Triglycerides: 72 mg/dL (ref 0.0–149.0)
VLDL: 14.4 mg/dL (ref 0.0–40.0)

## 2021-10-19 LAB — HEMOGLOBIN A1C: Hgb A1c MFr Bld: 5.7 % (ref 4.6–6.5)

## 2021-10-19 NOTE — Progress Notes (Signed)
Patient here in office for 2nd shingrix vaccine. Given in left deltoid. Patient tolerated well  Please cosign

## 2021-11-16 ENCOUNTER — Other Ambulatory Visit: Payer: Self-pay | Admitting: Internal Medicine

## 2022-01-09 ENCOUNTER — Encounter: Payer: Self-pay | Admitting: Internal Medicine

## 2022-01-09 NOTE — Progress Notes (Signed)
Outside notes received. Information abstracted. Notes sent to scan.  

## 2022-01-16 ENCOUNTER — Encounter: Payer: Self-pay | Admitting: Registered"

## 2022-01-16 ENCOUNTER — Encounter: Payer: 59 | Attending: Internal Medicine | Admitting: Registered"

## 2022-01-16 DIAGNOSIS — R7303 Prediabetes: Secondary | ICD-10-CM

## 2022-01-16 DIAGNOSIS — E7800 Pure hypercholesterolemia, unspecified: Secondary | ICD-10-CM | POA: Diagnosis present

## 2022-01-16 NOTE — Patient Instructions (Addendum)
Diet: Continue having Fruit and Vegetables daily Consider adding whole grains back into diet Include protein with each meal (Lean meats, nuts, seeds, yogurt, low fat cheese) Fats: continue including healthy fats, start reading labels for saturated fat Continue with exercise: 3-5 x/week to = 150 min/week of vigorous activity Continue getting Sleep: 8 hours of restful sleep Consider working on stress management tools.

## 2022-01-16 NOTE — Progress Notes (Signed)
Medical Nutrition Therapy  Appointment Start time:  1500  Appointment End time:  1605  Primary concerns today: Pt states she wants to keep improving her health and reduce risk of developing deiabetes  Referral diagnosis:  R73.03 (ICD-10-CM) - Prediabetes  E78.00 (ICD-10-CM) - Hypercholesterolemia   Preferred learning style: no preference indicated Learning readiness: ready, change in progress  NUTRITION ASSESSMENT  Anthropometrics  Not assessed this visit  Clinical Medical Hx: high LDL cholesterol Medications: reviewed Labs: Ttl Chol 231; HDL 58; LDL 157; TG 72; A1c 5.7% down from 5.8% Notable Signs/Symptoms: feels fine  Lifestyle & Dietary Hx Pt states she made drastic changes starting in March after the pre-diabetes diagnosis and lost 22 lbs. Pt states she gained 22 lbs over the year prior due to elevated stress and stopped her exercise routine and stopped paying attention to diet.  Pt states she was disappointed that the changes to diet and getting regular exercise didn't make more of a difference to her A1c. Pt states she stopped eating red meat and mostly just eats fish now, cut out pasta and bread and eating a lot of fruits and vegetables.   Pt states she has had an issue with elevated cholesterol for a while but statins made her ache all over.   Pt states her thyroid issue and menopause was affecting sleep, but has been getting good sleep for the past 4-5 months.   Estimated daily fluid intake: 80 oz water Supplements: reviewed Sleep: 7-8 hrs  Stress / self-care: 7/10, (work related); exercise, husband listens Current average weekly physical activity: 4-5 x/week at gym, weights 20-25 min, 60 min total walking  Mon-Thurs Salmon and salad, cheat day on weekend   24-Hr Dietary Recall First Meal: apple, handful of pistachios, water Snack: none Second Meal: vegetables, peppers, blueberries, (busy today) Snack:  Third Meal: (cheat day) pizza, vegetables (generally salmon &  salad) Snack: yogurt or carb smart  Beverages: 80 oz water, on cheat day 1/2 & 1/2 tea or soda  NUTRITION DIAGNOSIS  NB-1.1 Food and nutrition-related knowledge deficit As related to balanced eating.  As evidenced by cutting out carbs and having a limited diet to health concerns.  NUTRITION INTERVENTION  Nutrition education (E-1) on the following topics:  MyPlate balanced eating Reading labels Carb/protein balance Insulin/glucose in the body  Handouts Provided Include  Nutrition Facts Label Breakfast ideas MyPlate  Learning Style & Readiness for Change Teaching method utilized: Visual & Auditory  Demonstrated degree of understanding via: Teach Back  Barriers to learning/adherence to lifestyle change: none  Goals Established by Pt Diet: Continue having Fruit and Vegetables daily Consider adding whole grains back into diet Include protein with each meal (Lean meats, nuts, seeds, yogurt, low fat cheese) Fats: continue including healthy fats, start reading labels for saturated fat Continue with exercise: 3 5 x/week to = 150 min/week of vigorous activity Continue getting Sleep: 8 hours of restful sleep Consider working on stress management tools.   MONITORING & EVALUATION Dietary intake, weekly physical activity, and lipid labs in prn.

## 2022-07-20 ENCOUNTER — Encounter: Payer: Self-pay | Admitting: Internal Medicine

## 2022-07-20 NOTE — Progress Notes (Signed)
Subjective:    Patient ID: Emily Anderson, female    DOB: 1961/01/06, 62 y.o.   MRN: [id number]      HPI Emily Anderson is here for a Physical exam and her chronic medical problems.    Eating more healthy.  Has lost weight.  No concerns.    The 10-year ASCVD risk score (Arnett DK, et al., 2019) is: 2.7%   Values used to calculate the score:     Age: 62 years     Sex: Female     Is Non-Hispanic African American: No     Diabetic: No     Tobacco smoker: No     Systolic Blood Pressure: [id number] mmHg     Is BP treated: No     HDL Cholesterol: 58.8 mg/dL     Total Cholesterol: 231 mg/dL  Medications and allergies reviewed with patient and updated if appropriate.  Current Outpatient Medications on File Prior to Visit  Medication Sig Dispense Refill   aspirin EC 81 MG tablet Take 81 mg by mouth daily.     Biotin 5000 MCG TABS Take 10,000 mcg/day by mouth daily.     cetirizine (ZYRTEC) 10 MG tablet Take 10 mg by mouth daily.     cholecalciferol (VITAMIN D) 25 MCG (1000 UNIT) tablet 1 tablet     IMVEXXY STARTER PACK 10 MCG INST Place vaginally.     Multiple Vitamins-Minerals (CENTRUM SILVER PO) Take 1 tablet by mouth daily.     Omega-3 Fatty Acids (FISH OIL PO) Take 1,200 mg by mouth daily.     OVER THE COUNTER MEDICATION Tumeric Curcumin 500 mg. One capsule daily.     OVER THE COUNTER MEDICATION Tart Cherry Extract, 1200 mg one tablet daily.     polyethylene glycol powder (MIRALAX) 17 GM/SCOOP powder Take 17 g by mouth daily. 17 g 0   valACYclovir (VALTREX) 1000 MG tablet Take 2,000 mg by mouth 2 (two) times daily as needed.     No current facility-administered medications on file prior to visit.    Review of Systems  Constitutional:  Negative for fever.  Eyes:  Negative for visual disturbance.  Respiratory:  Negative for cough, shortness of breath and wheezing.   Cardiovascular:  Negative for chest pain, palpitations and leg swelling.  Gastrointestinal:  Negative for abdominal  pain, blood in stool, constipation and diarrhea.       No gerd  Genitourinary:  Negative for dysuria.  Musculoskeletal:  Positive for arthralgias (right hip bursitis) and back pain (lower back some).  Skin:  Negative for rash.  Neurological:  Negative for light-headedness and headaches.  Psychiatric/Behavioral:  Negative for dysphoric mood. The patient is not nervous/anxious.        Objective:   Vitals:   07/21/22 0750  BP: 106/78  Pulse: 64  Temp: 98.1 F (36.7 C)  SpO2: 98%   Filed Weights   07/21/22 0750  Weight: 159 lb (72.1 kg)   Body mass index is 24.18 kg/m.  BP Readings from Last 3 Encounters:  07/21/22 106/78  07/18/21 110/78  12/03/20 102/72    Wt Readings from Last 3 Encounters:  07/21/22 159 lb (72.1 kg)  07/18/21 180 lb (81.6 kg)  12/03/20 178 lb (80.7 kg)       Physical Exam Constitutional: She appears well-developed and well-nourished. No distress.  HENT:  Head: Normocephalic and atraumatic.  Right Ear: External ear normal. Normal ear canal and TM Left Ear: External ear normal.  Normal ear canal  and TM Mouth/Throat: Oropharynx is clear and moist.  Eyes: Conjunctivae normal.  Neck: Neck supple. No tracheal deviation present. No thyromegaly present.  No carotid bruit  Cardiovascular: Normal rate, regular rhythm and normal heart sounds.   No murmur heard.  No edema. Pulmonary/Chest: Effort normal and breath sounds normal. No respiratory distress. She has no wheezes. She has no rales.  Breast: deferred   Abdominal: Soft. She exhibits no distension. There is no tenderness.  Lymphadenopathy: She has no cervical adenopathy.  Skin: Skin is warm and dry. She is not diaphoretic.  Psychiatric: She has a normal mood and affect. Her behavior is normal.     Lab Results  Component Value Date   WBC 6.0 07/18/2021   HGB 14.0 07/18/2021   HCT 41.5 07/18/2021   PLT 213.0 07/18/2021   GLUCOSE 105 (H) 07/18/2021   CHOL 231 (H) 10/19/2021   TRIG 72.0  10/19/2021   HDL 58.80 10/19/2021   LDLDIRECT 171.0 08/23/2015   LDLCALC 157 (H) 10/19/2021   ALT 34 07/18/2021   AST 24 07/18/2021   NA 139 07/18/2021   K 4.2 07/18/2021   CL 104 07/18/2021   CREATININE 0.77 07/18/2021   BUN 15 07/18/2021   CO2 28 07/18/2021   TSH 1.70 07/18/2021   HGBA1C 5.7 10/19/2021         Assessment & Plan:   Physical exam: Screening blood work  ordered Exercise  daily - regular Weight  normal Substance abuse  none   Reviewed recommended immunizations.   Health Maintenance  Topic Date Due   INFLUENZA VACCINE  07/30/2022 (Originally 11/29/2021)   DTaP/Tdap/Td (2 - Td or Tdap) 05/15/2023   MAMMOGRAM  09/29/2023   PAP SMEAR-Modifier  06/07/2025   COLONOSCOPY (Pts 45-86yrs Insurance coverage will need to be confirmed)  09/01/2027   Hepatitis C Screening  Completed   HIV Screening  Completed   Zoster Vaccines- Shingrix  Completed   HPV VACCINES  Aged Out   COVID-19 Vaccine  Discontinued          See Problem List for Assessment and Plan of chronic medical problems.

## 2022-07-20 NOTE — Patient Instructions (Addendum)
Blood work was ordered.   The lab is on the first floor.    Medications changes include :   none     Return in about 1 year (around 07/21/2023) for Physical Exam.    Health Maintenance, Female Adopting a healthy lifestyle and getting preventive care are important in promoting health and wellness. Ask your health care provider about: The right schedule for you to have regular tests and exams. Things you can do on your own to prevent diseases and keep yourself healthy. What should I know about diet, weight, and exercise? Eat a healthy diet  Eat a diet that includes plenty of vegetables, fruits, low-fat dairy products, and lean protein. Do not eat a lot of foods that are high in solid fats, added sugars, or sodium. Maintain a healthy weight Body mass index (BMI) is used to identify weight problems. It estimates body fat based on height and weight. Your health care provider can help determine your BMI and help you achieve or maintain a healthy weight. Get regular exercise Get regular exercise. This is one of the most important things you can do for your health. Most adults should: Exercise for at least 150 minutes each week. The exercise should increase your heart rate and make you sweat (moderate-intensity exercise). Do strengthening exercises at least twice a week. This is in addition to the moderate-intensity exercise. Spend less time sitting. Even light physical activity can be beneficial. Watch cholesterol and blood lipids Have your blood tested for lipids and cholesterol at 62 years of age, then have this test every 5 years. Have your cholesterol levels checked more often if: Your lipid or cholesterol levels are high. You are older than 62 years of age. You are at high risk for heart disease. What should I know about cancer screening? Depending on your health history and family history, you may need to have cancer screening at various ages. This may include screening  for: Breast cancer. Cervical cancer. Colorectal cancer. Skin cancer. Lung cancer. What should I know about heart disease, diabetes, and high blood pressure? Blood pressure and heart disease High blood pressure causes heart disease and increases the risk of stroke. This is more likely to develop in people who have high blood pressure readings or are overweight. Have your blood pressure checked: Every 3-5 years if you are 59-30 years of age. Every year if you are 98 years old or older. Diabetes Have regular diabetes screenings. This checks your fasting blood sugar level. Have the screening done: Once every three years after age 60 if you are at a normal weight and have a low risk for diabetes. More often and at a younger age if you are overweight or have a high risk for diabetes. What should I know about preventing infection? Hepatitis B If you have a higher risk for hepatitis B, you should be screened for this virus. Talk with your health care provider to find out if you are at risk for hepatitis B infection. Hepatitis C Testing is recommended for: Everyone born from [birth year] through 1965. Anyone with known risk factors for hepatitis C. Sexually transmitted infections (STIs) Get screened for STIs, including gonorrhea and chlamydia, if: You are sexually active and are younger than 62 years of age. You are older than 62 years of age and your health care provider tells you that you are at risk for this type of infection. Your sexual activity has changed since you were last screened, and you are  at increased risk for chlamydia or gonorrhea. Ask your health care provider if you are at risk. Ask your health care provider about whether you are at high risk for HIV. Your health care provider may recommend a prescription medicine to help prevent HIV infection. If you choose to take medicine to prevent HIV, you should first get tested for HIV. You should then be tested every 3 months for as long as you  are taking the medicine. Pregnancy If you are about to stop having your period (premenopausal) and you may become pregnant, seek counseling before you get pregnant. Take 400 to 800 micrograms (mcg) of folic acid every day if you become pregnant. Ask for birth control (contraception) if you want to prevent pregnancy. Osteoporosis and menopause Osteoporosis is a disease in which the bones lose minerals and strength with aging. This can result in bone fractures. If you are 71 years old or older, or if you are at risk for osteoporosis and fractures, ask your health care provider if you should: Be screened for bone loss. Take a calcium or vitamin D supplement to lower your risk of fractures. Be given hormone replacement therapy (HRT) to treat symptoms of menopause. Follow these instructions at home: Alcohol use Do not drink alcohol if: Your health care provider tells you not to drink. You are pregnant, may be pregnant, or are planning to become pregnant. If you drink alcohol: Limit how much you have to: 0-1 drink a day. Know how much alcohol is in your drink. In the [place] one drink equals one 12 oz bottle of beer (355 mL), one 5 oz glass of wine (148 mL), or one 1 oz glass of hard liquor (44 mL). Lifestyle Do not use any products that contain nicotine or tobacco. These products include cigarettes, chewing tobacco, and vaping devices, such as e-cigarettes. If you need help quitting, ask your health care provider. Do not use street drugs. Do not share needles. Ask your health care provider for help if you need support or information about quitting drugs. General instructions Schedule regular health, dental, and eye exams. Stay current with your vaccines. Tell your health care provider if: You often feel depressed. You have ever been abused or do not feel safe at home. Summary Adopting a healthy lifestyle and getting preventive care are important in promoting health and wellness. Follow your  health care provider's instructions about healthy diet, exercising, and getting tested or screened for diseases. Follow your health care provider's instructions on monitoring your cholesterol and blood pressure. This information is not intended to replace advice given to you by your health care provider. Make sure you discuss any questions you have with your health care provider. Document Revised: 09/06/2020 Document Reviewed: 09/06/2020 Elsevier Patient Education  [place].

## 2022-07-21 ENCOUNTER — Ambulatory Visit (INDEPENDENT_AMBULATORY_CARE_PROVIDER_SITE_OTHER): Payer: 59 | Admitting: Internal Medicine

## 2022-07-21 VITALS — BP 106/78 | HR 64 | Temp 98.1°F | Wt 159.0 lb

## 2022-07-21 DIAGNOSIS — E7800 Pure hypercholesterolemia, unspecified: Secondary | ICD-10-CM

## 2022-07-21 DIAGNOSIS — I10 Essential (primary) hypertension: Secondary | ICD-10-CM | POA: Diagnosis not present

## 2022-07-21 DIAGNOSIS — L659 Nonscarring hair loss, unspecified: Secondary | ICD-10-CM | POA: Insufficient documentation

## 2022-07-21 DIAGNOSIS — Z0000 Encounter for general adult medical examination without abnormal findings: Secondary | ICD-10-CM

## 2022-07-21 DIAGNOSIS — R7303 Prediabetes: Secondary | ICD-10-CM | POA: Diagnosis not present

## 2022-07-21 DIAGNOSIS — E038 Other specified hypothyroidism: Secondary | ICD-10-CM

## 2022-07-21 DIAGNOSIS — E063 Autoimmune thyroiditis: Secondary | ICD-10-CM

## 2022-07-21 DIAGNOSIS — R9431 Abnormal electrocardiogram [ECG] [EKG]: Secondary | ICD-10-CM

## 2022-07-21 DIAGNOSIS — R739 Hyperglycemia, unspecified: Secondary | ICD-10-CM

## 2022-07-21 LAB — COMPREHENSIVE METABOLIC PANEL
ALT: 24 U/L (ref 0–35)
AST: 22 U/L (ref 0–37)
Albumin: 4.4 g/dL (ref 3.5–5.2)
Alkaline Phosphatase: 57 U/L (ref 39–117)
BUN: 17 mg/dL (ref 6–23)
CO2: 30 mEq/L (ref 19–32)
Calcium: 9.6 mg/dL (ref 8.4–10.5)
Chloride: 105 mEq/L (ref 96–112)
Creatinine, Ser: 0.76 mg/dL (ref 0.40–1.20)
GFR: 84.67 mL/min (ref >60.00)
Glucose, Bld: 98 mg/dL (ref 70–99)
Potassium: 4.2 mEq/L (ref 3.5–5.1)
Sodium: 143 mEq/L (ref 135–145)
Total Bilirubin: 0.5 mg/dL (ref 0.2–1.2)
Total Protein: 7.3 g/dL (ref 6.0–8.3)

## 2022-07-21 LAB — CBC WITH DIFFERENTIAL/PLATELET
Basophils Absolute: 0 10*3/uL (ref 0.0–0.1)
Basophils Relative: 0.8 % (ref 0.0–3.0)
Eosinophils Absolute: 0.3 10*3/uL (ref 0.0–0.7)
Eosinophils Relative: 4.7 % (ref 0.0–5.0)
HCT: 40.9 % (ref 36.0–46.0)
Hemoglobin: 13.9 g/dL (ref 12.0–15.0)
Lymphocytes Relative: 38.7 % (ref 12.0–46.0)
Lymphs Abs: 2.3 10*3/uL (ref 0.7–4.0)
MCHC: 34 g/dL (ref 30.0–36.0)
MCV: 94.7 fl (ref 78.0–100.0)
Monocytes Absolute: 0.5 10*3/uL (ref 0.1–1.0)
Monocytes Relative: 8.6 % (ref 3.0–12.0)
Neutro Abs: 2.8 10*3/uL (ref 1.4–7.7)
Neutrophils Relative %: 47.2 % (ref 43.0–77.0)
Platelets: 213 10*3/uL (ref 150.0–400.0)
RBC: 4.32 Mil/uL (ref 3.87–5.11)
RDW: 13.1 % (ref 11.5–15.5)
WBC: 5.9 10*3/uL (ref 4.0–10.5)

## 2022-07-21 LAB — LIPID PANEL
Cholesterol: 230 mg/dL — ABNORMAL HIGH (ref 0–200)
HDL: 72.5 mg/dL (ref >39.00)
LDL Cholesterol: 144 mg/dL — ABNORMAL HIGH (ref 0–99)
NonHDL: 157.81
Total CHOL/HDL Ratio: 3
Triglycerides: 69 mg/dL (ref 0.0–149.0)
VLDL: 13.8 mg/dL (ref 0.0–40.0)

## 2022-07-21 LAB — TSH: TSH: 2.98 u[IU]/mL (ref 0.35–5.50)

## 2022-07-21 LAB — VITAMIN B12: Vitamin B-12: 632 pg/mL (ref 211–911)

## 2022-07-21 LAB — HEMOGLOBIN A1C: Hgb A1c MFr Bld: 5.6 % (ref 4.6–6.5)

## 2022-07-21 MED ORDER — LEVOTHYROXINE SODIUM 50 MCG PO TABS: ORAL_TABLET | ORAL | 3 refills | Status: DC

## 2022-07-21 NOTE — Assessment & Plan Note (Signed)
New Has been experiencing some hair loss ?  Related to change in diet, weight loss May also be thyroid related which we are checking today Check B12 level

## 2022-07-21 NOTE — Assessment & Plan Note (Signed)
Chronic Family history of diabetes Check a1c Low sugar / carb diet Stressed regular exercise 

## 2022-07-21 NOTE — Assessment & Plan Note (Addendum)
Chronic Blood pressure well controlled CMP Stopped spironolactone and BP at home well controlled  EKG today: Sinus bradycardia at 58 bpm, possible Q waves in lead II,III, aVF, V1, V2, which is new compared to previous EKG from 03/20/2018 and 02/18/2015

## 2022-07-21 NOTE — Assessment & Plan Note (Signed)
New EKG today: Sinus bradycardia at 58 bpm, possible Q waves in lead II,III, aVF, V1, V2, which is new compared to previous EKG from 03/20/2018 and 02/18/2015  I do not think she has had a septal infarct-possible lead placement Will go ahead and get a 2D echocardiogram to evaluate for to confirm

## 2022-07-21 NOTE — Assessment & Plan Note (Signed)
Chronic  ?Clinically euthyroid ?Currently taking levothyroxine 50 mcg 5 days a week, 100 mcg 2 days a week ?Check tsh  ?Titrate med dose if needed ?

## 2022-07-21 NOTE — Assessment & Plan Note (Signed)
Chronic Regular exercise and healthy diet encouraged Check lipid panel  Lifestyle controlled - low ascvd risk

## 2022-08-04 IMAGING — US US THYROID
1 series · 14 of 25 positions shown · non-contrast
Comparison: None.

CLINICAL DATA: Right nodule on physical exam, family history of
thyroid carcinoma

EXAM:
THYROID ULTRASOUND
TECHNIQUE: Ultrasound examination of the thyroid gland and adjacent soft
tissues was performed.

[Series 1: us thyroid · 0.07mm/px · 14 of 42 slices shown]
[im 1/42]
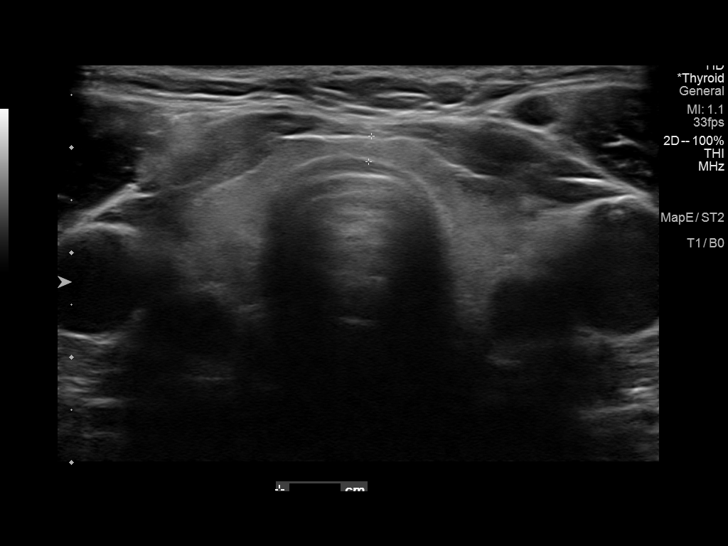
[im 4/42]
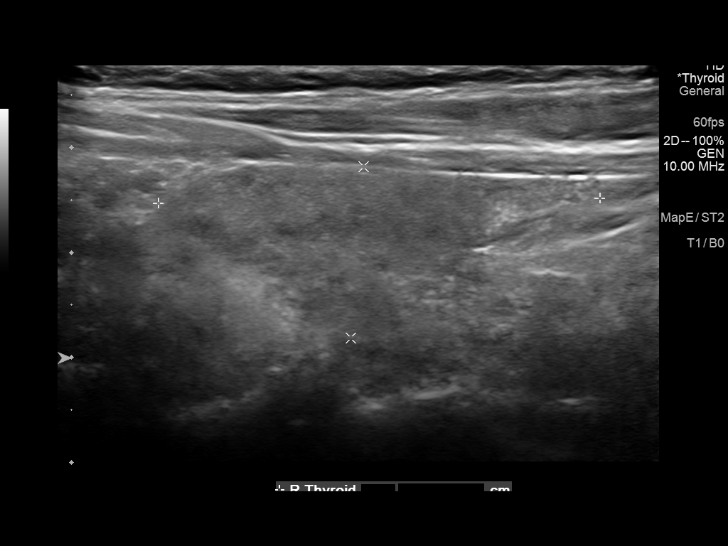
[im 7/42]
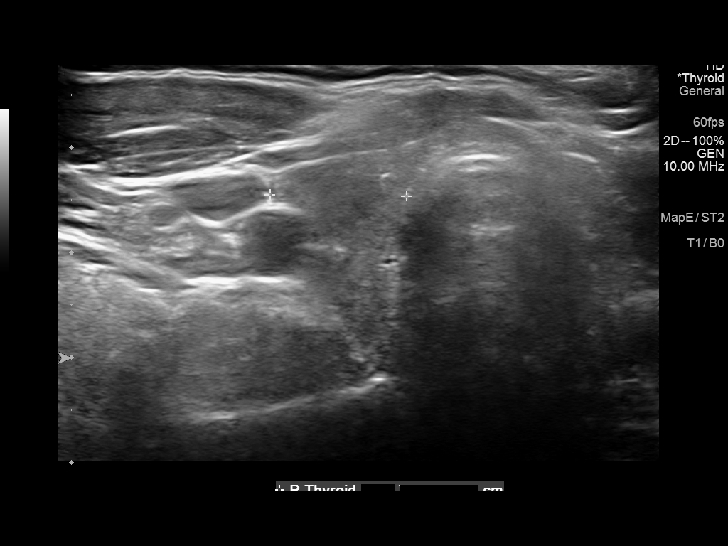
[im 11/42]
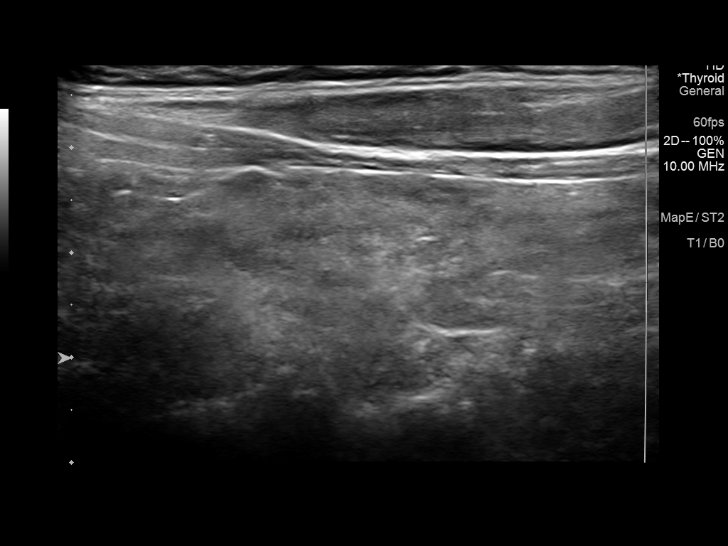
[im 14/42]
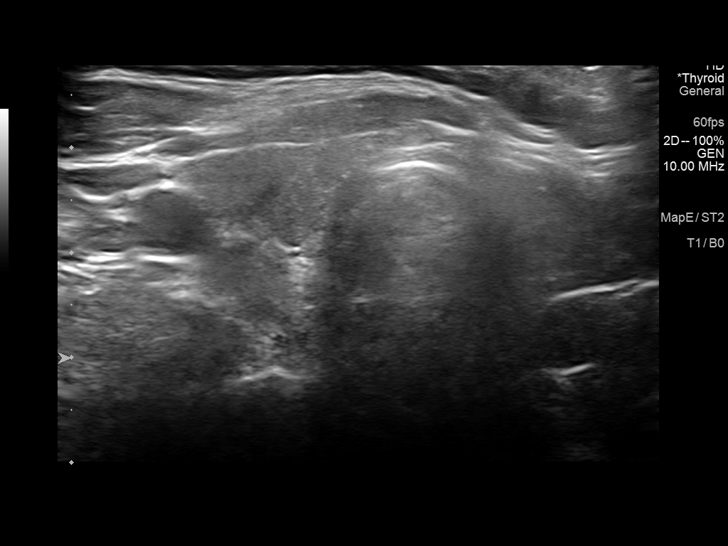
[im 16/42]
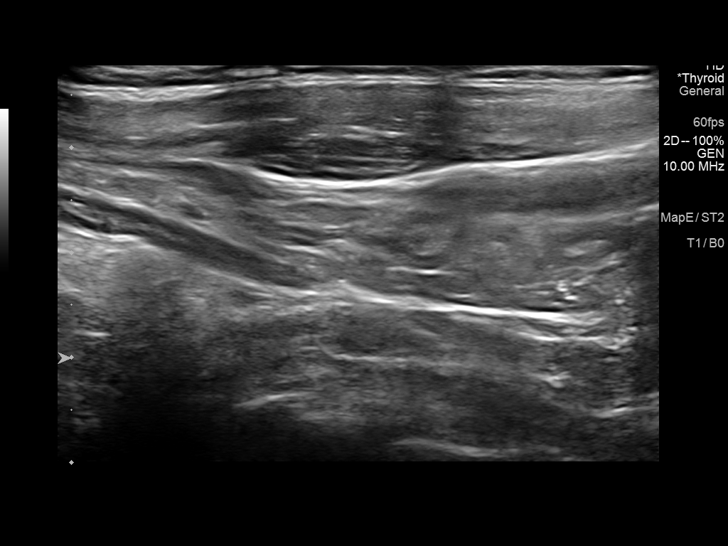
[im 19/42]
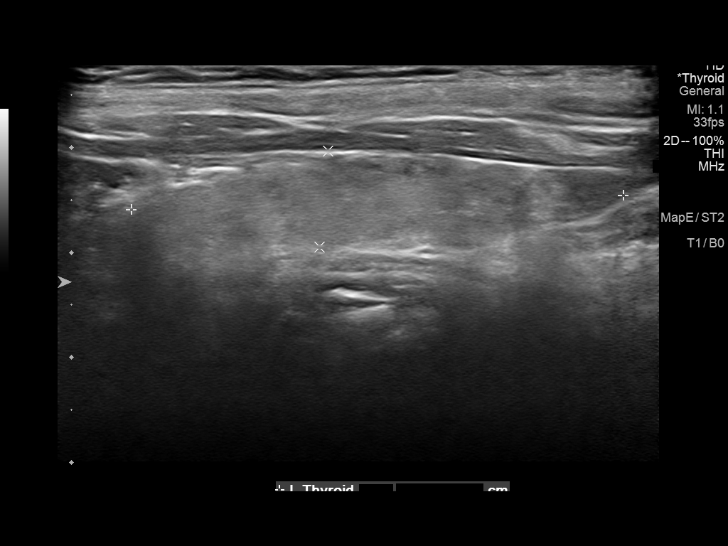
[im 23/42]
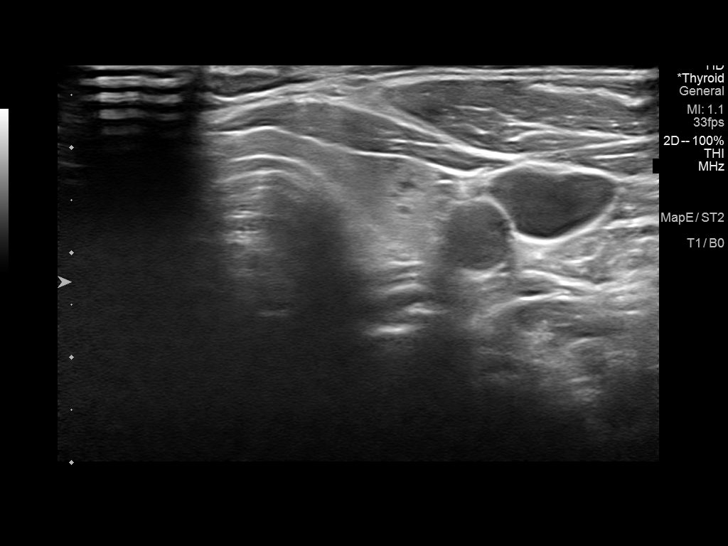
[im 26/42]
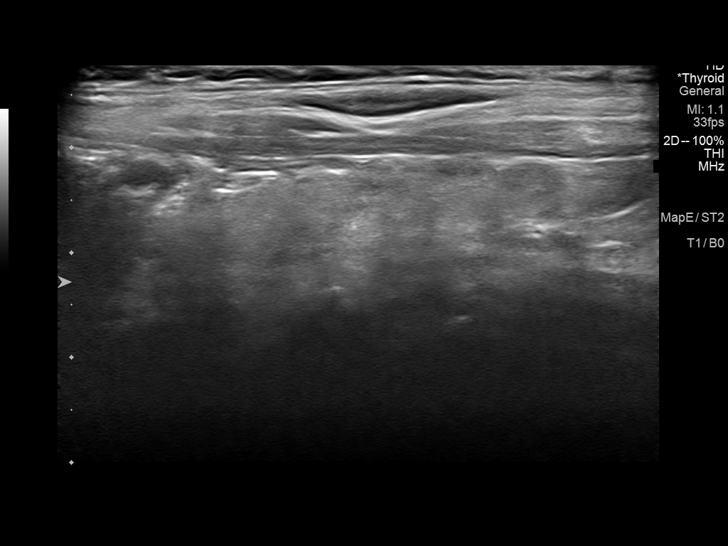
[im 28/42]
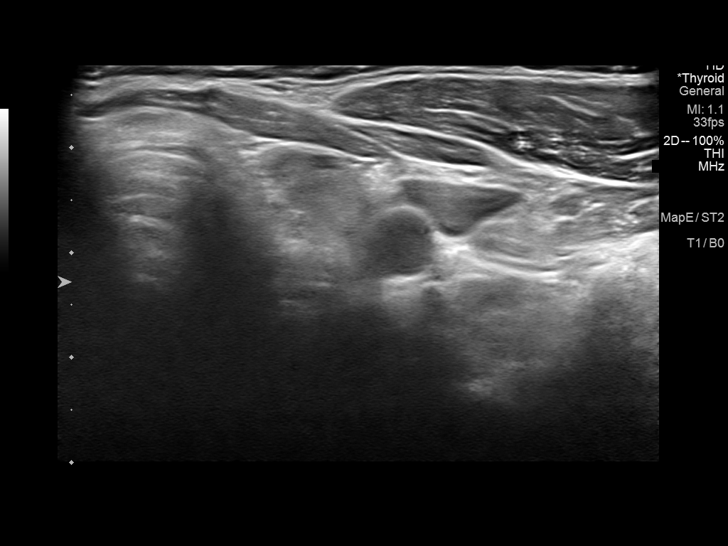
[im 31/42]
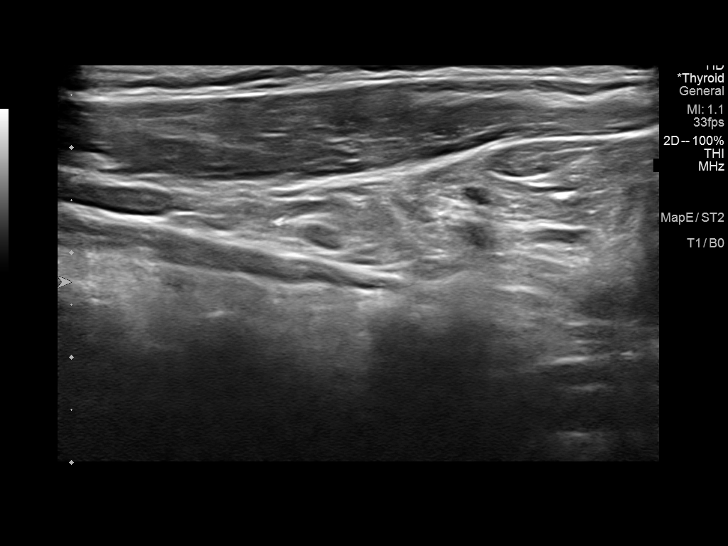
[im 35/42]
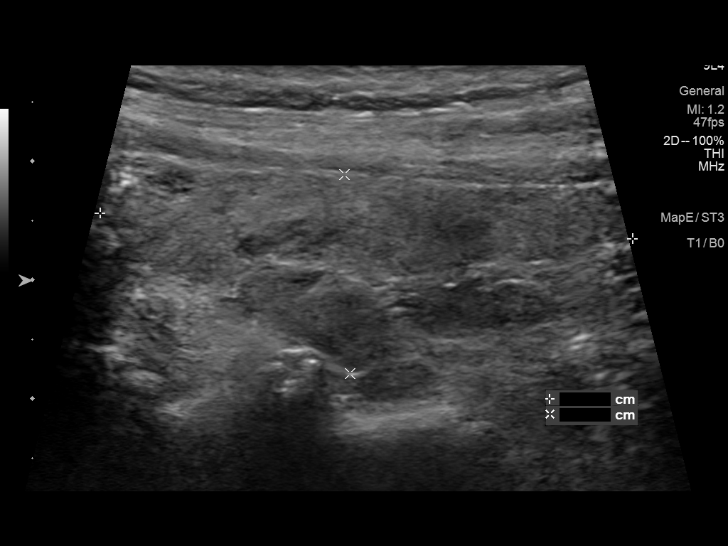
[im 38/42]
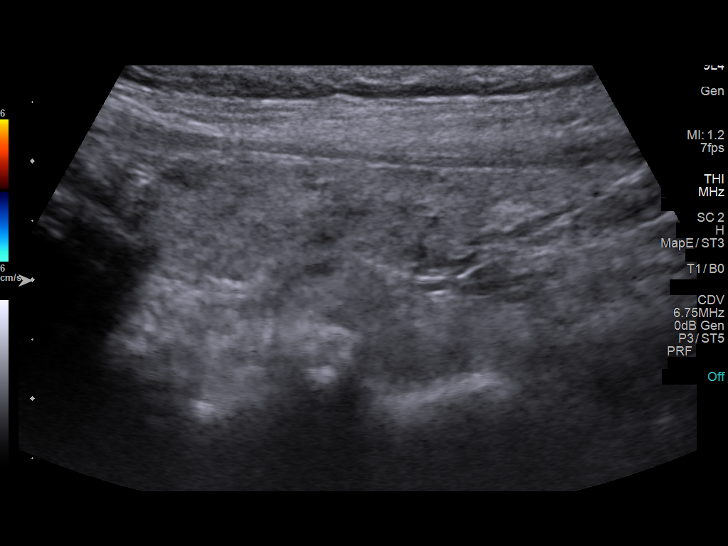
[im 42/42]
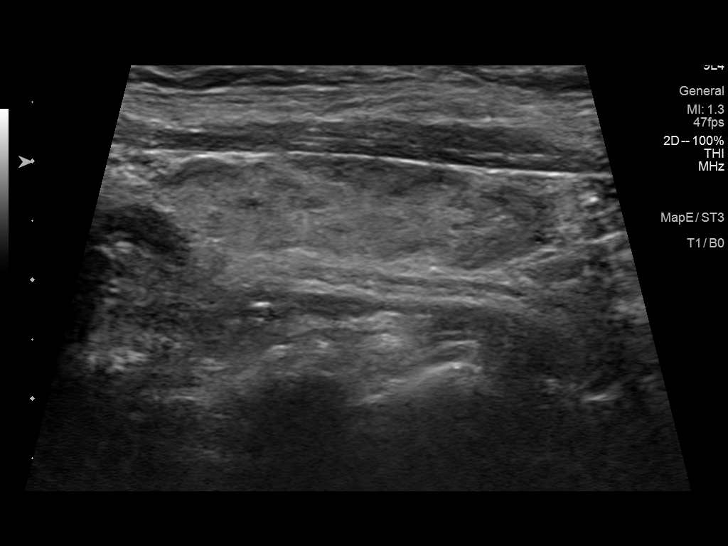

[14 of 25 positions shown; findings below may reference images not displayed]

FINDINGS: Parenchymal Echotexture: Moderately heterogenous

Isthmus: 0.3 cm thickness

Right lobe: 4.5 x 1.7 x

Left lobe: 4.7 x 0.9 x

_________________________________________________________

Estimated total number of nodules >/= 1 cm: 0

Number of spongiform nodules >/=  2 cm not described below (TR1): 0

Number of mixed cystic and solid nodules >/= 1.5 cm not described
below (TR2):

_________________________________________________________

No discrete nodules are seen within the thyroid gland. No regional
adenopathy identified.
IMPRESSION: Heterogenous thyroid without nodule or other indication for biopsy
or follow-up.

The above is in keeping with the ACR TI-RADS recommendations - [HOSPITAL] 6527;[DATE].

## 2022-08-10 ENCOUNTER — Other Ambulatory Visit: Payer: Self-pay | Admitting: Internal Medicine

## 2022-08-23 ENCOUNTER — Ambulatory Visit (HOSPITAL_COMMUNITY): Payer: 59 | Attending: Cardiology

## 2022-08-23 DIAGNOSIS — R9431 Abnormal electrocardiogram [ECG] [EKG]: Secondary | ICD-10-CM | POA: Diagnosis not present

## 2022-08-23 LAB — ECHOCARDIOGRAM COMPLETE
Area-P 1/2: 3.21 cm2
S' Lateral: 3.1 cm

## 2022-08-31 NOTE — Progress Notes (Signed)
[name] [address] [place] [address] Phone: [phone] Subjective:   Bruce Donath, am serving as a scribe for Dr. Antoine Primas.  I'm seeing this patient by the request  of:  Pincus Sanes, MD  CC: Right hip pain  [id number]  Emily Anderson is a 62 y.o. female coming in with complaint of R hip pain. Last seen in 2020 for OMT. Patient states pain over GT that radiates into R quad. Pain has not ever subsided. Getting ready to go on trip in September. Has lower back pain that occurs constantly.        Past Medical History:  Diagnosis Date   Allergy    Dyspepsia    food triggers   GERD (gastroesophageal reflux disease)    Heart murmur    Hyperlipidemia    Hypertension    Hypothyroidism    Past Surgical History:  Procedure Laterality Date   ABLATION     CESAREAN SECTION     G 2 P 2, both C sections   COLONOSCOPY  2009   negative; [facility]   LAPAROSCOPIC VAGINAL HYSTERECTOMY WITH SALPINGECTOMY Bilateral 08/02/2015   Procedure: LAPAROSCOPIC ASSISTED VAGINAL HYSTERECTOMY WITH SALPINGECTOMY;  Surgeon: Huel Cote, MD;  Location: [facility];  Service: Gynecology;  Laterality: Bilateral;   Social History   Socioeconomic History   Marital status: Married    Spouse name: Not on file   Number of children: Not on file   Years of education: Not on file   Highest education level: Not on file  Occupational History   Occupation: MANAGER    Employer: [employer]  Tobacco Use   Smoking status: Never   Smokeless tobacco: Never  Substance and Sexual Activity   Alcohol use: No   Drug use: No   Sexual activity: Yes  Other Topics Concern   Not on file  Social History Narrative   REG EXERCISE..   LOW CARB LOW NA DIET   Social Determinants of Health   Financial Resource Strain: Not on file  Food Insecurity: No Food Insecurity (01/16/2022)   Hunger Vital Sign    Worried About Running Out of Food in  the Last Year: Never true    Ran Out of Food in the Last Year: Never true  Transportation Needs: Not on file  Physical Activity: Not on file  Stress: Not on file  Social Connections: Not on file   Allergies  Allergen Reactions   Amoxicillin Rash    Rash Because of a history of documented adverse serious drug reaction;Medi Alert bracelet  is recommended Has patient had a PCN reaction causing immediate rash, facial/tongue/throat swelling, SOB or lightheadedness with hypotension: No Has patient had a PCN reaction causing severe rash involving mucus membranes or skin necrosis: No Has patient had a PCN reaction that required hospitalization No Has patient had a PCN reaction occurring within the last 10 years: No If all of the above answers are "N Rash Because of a history of documented adverse serious drug reaction;Medi Alert bracelet  is recommended Has patient had a PCN reaction causing immediate rash, facial/tongue/throat swelling, SOB or lightheadedness with hypotension: No Has patient had a PCN reaction causing severe rash involving mucus membranes or skin necrosis: No Has patient had a PCN reaction that required hospitalization No Has patient had a PCN reaction occurring within the last 10 years: No If all of the above answers are "N   Hctz [Hydrochlorothiazide] Other (See  Comments)    03/2012 K+ 3.3 03/2012 K+ 3.3   Sulfamethoxazole-Trimethoprim Rash    H/a's, heart racing & flu symptoms   Family History  Problem Relation Age of Onset   Thyroid cancer Mother    Prostate cancer Father    Heart disease Father 22       angioplasty    Colon polyps Father    Diabetes Father    Other Father 56       carcinoid tumor    Colon cancer Father    Diabetes Paternal Aunt        1   Diabetes Paternal Uncle         2   Colon polyps Sister    Muscular dystrophy Sister    Stroke Neg Hx    Esophageal cancer Neg Hx    Liver cancer Neg Hx    Pancreatic cancer Neg Hx    Rectal cancer  Neg Hx    Stomach cancer Neg Hx     Current Outpatient Medications (Endocrine & Metabolic):    levothyroxine (SYNTHROID) 50 MCG tablet, TAKE 2 TABLETS BY MOUTH EVERY SATURDAY AND SUNDAY, THEN 1 TABLET ON MONDAY THROUGH FRIDAY   Current Outpatient Medications (Respiratory):    cetirizine (ZYRTEC) 10 MG tablet, Take 10 mg by mouth daily.  Current Outpatient Medications (Analgesics):    aspirin EC 81 MG tablet, Take 81 mg by mouth daily.   Current Outpatient Medications (Other):    Biotin 5000 MCG TABS, Take 10,000 mcg/day by mouth daily.   cholecalciferol (VITAMIN D) 25 MCG (1000 UNIT) tablet, 1 tablet   IMVEXXY STARTER PACK 10 MCG INST, Place vaginally.   Multiple Vitamins-Minerals (CENTRUM SILVER PO), Take 1 tablet by mouth daily.   Omega-3 Fatty Acids (FISH OIL PO), Take 1,200 mg by mouth daily.   OVER THE COUNTER MEDICATION, Tumeric Curcumin 500 mg. One capsule daily.   OVER THE COUNTER MEDICATION, Tart Cherry Extract, 1200 mg one tablet daily.   polyethylene glycol powder (MIRALAX) 17 GM/SCOOP powder, Take 17 g by mouth daily.   valACYclovir (VALTREX) 1000 MG tablet, Take 2,000 mg by mouth 2 (two) times daily as needed.   Reviewed prior external information including notes and imaging from  primary care provider As well as notes that were available from care everywhere and other healthcare systems.  Past medical history, social, surgical and family history all reviewed in electronic medical record.  No pertanent information unless stated regarding to the chief complaint.   Review of Systems:  No headache, visual changes, nausea, vomiting, diarrhea, constipation, dizziness, abdominal pain, skin rash, fevers, chills, night sweats, weight loss, swollen lymph nodes, body aches, joint swelling, chest pain, shortness of breath, mood changes. POSITIVE muscle aches  Objective  Blood pressure 118/68, pulse 72, height 5\' 8"  (1.727 m), weight 160 lb (72.6 kg), last menstrual period  07/19/2015, SpO2 98 %.   General: No apparent distress alert and oriented x3 mood and affect normal, dressed appropriately.  HEENT: Pupils equal, extraocular movements intact  Respiratory: Patient's speak in full sentences and does not appear short of breath  Cardiovascular: No lower extremity edema, non tender, no erythema  Right hip exam shows severe tenderness to palpation over the greater trochanteric area.  Seems to be more little bit posteriorly.  Back exam does have some loss of lordosis noted as well.   Procedure: Real-time Ultrasound Guided Injection of right greater trochanteric bursitis secondary to patient's body habitus Device: GE Logiq Q7 Ultrasound guided injection is  preferred based studies that show increased duration, increased effect, greater accuracy, decreased procedural pain, increased response rate, and decreased cost with ultrasound guided versus blind injection.  Verbal informed consent obtained.  Time-out conducted.  Noted no overlying erythema, induration, or other signs of local infection.  Skin prepped in a sterile fashion.  Local anesthesia: Topical Ethyl chloride.  With sterile technique and under real time ultrasound guidance:  Greater trochanteric area was visualized and patient's bursa was noted. A 22-gauge 3 inch needle was inserted and 4 cc of 0.5% Marcaine and 1 cc of Kenalog 40 mg/dL was injected. Pictures taken Completed without difficulty  Pain immediately resolved suggesting accurate placement of the medication.  Advised to call if fevers/chills, erythema, induration, drainage, or persistent bleeding.  Images permanently stored and available for review in the ultrasound unit.  Impression: Technically successful ultrasound guided injection.    Impression and Recommendations:

## 2022-09-01 ENCOUNTER — Other Ambulatory Visit: Payer: Self-pay

## 2022-09-01 ENCOUNTER — Ambulatory Visit: Payer: 59 | Admitting: Family Medicine

## 2022-09-01 ENCOUNTER — Encounter: Payer: Self-pay | Admitting: Family Medicine

## 2022-09-01 ENCOUNTER — Ambulatory Visit (INDEPENDENT_AMBULATORY_CARE_PROVIDER_SITE_OTHER): Payer: 59

## 2022-09-01 VITALS — BP 118/68 | HR 72 | Ht 68.0 in | Wt 160.0 lb

## 2022-09-01 DIAGNOSIS — M545 Low back pain, unspecified: Secondary | ICD-10-CM

## 2022-09-01 DIAGNOSIS — M25551 Pain in right hip: Secondary | ICD-10-CM

## 2022-09-01 DIAGNOSIS — M7061 Trochanteric bursitis, right hip: Secondary | ICD-10-CM

## 2022-09-01 NOTE — Assessment & Plan Note (Signed)
Patient given injection and tolerated the procedure well, discussed icing regimen and home exercises, and given home exercises again that I think will be beneficial as well. .  We discussed icing regimen and home exercises otherwise.  Follow-up with me again 8 weeks

## 2022-09-01 NOTE — Patient Instructions (Signed)
Injected hip Xray today Glute exercises See me in 6-8 weeks

## 2022-10-26 NOTE — Progress Notes (Signed)
[name] [address] [place] [address] Phone: [phone] Subjective:   Bruce Donath, am serving as a scribe for Dr. Antoine Primas.  I'm seeing this patient by the request  of:  Pincus Sanes, MD  CC: Right hip and back pain  [id number]  09/01/2022 Patient given injection and tolerated the procedure well, discussed icing regimen and home exercises, and given home exercises again that I think will be beneficial as well. .  We discussed icing regimen and home exercises otherwise.  Follow-up with me again 8 weeks      Update 10/27/2022 Emily Anderson is a 62 y.o. female coming in with complaint of R hip and back pain. Patient states that injection did not help as much she hoped. A few days ago patient injured lower back after washing gutters. Mostly on R side. Noticed her R hip was higher than her L when looking in the mirror. Unable to walk on Monday. Pain has improved but is still very tight on both sides of her back. Pain was radiating down to the R knee.   Xray lumbar 09/01/2022 IMPRESSION: 1. No acute findings or significant interval change. 2. Multilevel lumbar degenerative disc disease.    Xray R hip 09/01/2022 IMPRESSION: 1. No acute findings. 2. Old right ischial tubercle avulsion.    Past Medical History:  Diagnosis Date   Allergy    Dyspepsia    food triggers   GERD (gastroesophageal reflux disease)    Heart murmur    Hyperlipidemia    Hypertension    Hypothyroidism    Past Surgical History:  Procedure Laterality Date   ABLATION     CESAREAN SECTION     G 2 P 2, both C sections   COLONOSCOPY  2009   negative; [facility]   LAPAROSCOPIC VAGINAL HYSTERECTOMY WITH SALPINGECTOMY Bilateral 08/02/2015   Procedure: LAPAROSCOPIC ASSISTED VAGINAL HYSTERECTOMY WITH SALPINGECTOMY;  Surgeon: Huel Cote, MD;  Location: [facility];  Service: Gynecology;  Laterality: Bilateral;   Social History   Socioeconomic History    Marital status: Married    Spouse name: Not on file   Number of children: Not on file   Years of education: Not on file   Highest education level: Not on file  Occupational History   Occupation: MANAGER    Employer: [employer]  Tobacco Use   Smoking status: Never   Smokeless tobacco: Never  Substance and Sexual Activity   Alcohol use: No   Drug use: No   Sexual activity: Yes  Other Topics Concern   Not on file  Social History Narrative   REG EXERCISE..   LOW CARB LOW NA DIET   Social Determinants of Health   Financial Resource Strain: Not on file  Food Insecurity: No Food Insecurity (01/16/2022)   Hunger Vital Sign    Worried About Running Out of Food in the Last Year: Never true    Ran Out of Food in the Last Year: Never true  Transportation Needs: Not on file  Physical Activity: Not on file  Stress: Not on file  Social Connections: Not on file   Allergies  Allergen Reactions   Amoxicillin Rash    Rash Because of a history of documented adverse serious drug reaction;Medi Alert bracelet  is recommended Has patient had a PCN reaction causing immediate rash, facial/tongue/throat swelling, SOB or lightheadedness with hypotension: No Has patient had a PCN reaction causing severe rash involving mucus membranes  or skin necrosis: No Has patient had a PCN reaction that required hospitalization No Has patient had a PCN reaction occurring within the last 10 years: No If all of the above answers are "N Rash Because of a history of documented adverse serious drug reaction;Medi Alert bracelet  is recommended Has patient had a PCN reaction causing immediate rash, facial/tongue/throat swelling, SOB or lightheadedness with hypotension: No Has patient had a PCN reaction causing severe rash involving mucus membranes or skin necrosis: No Has patient had a PCN reaction that required hospitalization No Has patient had a PCN reaction occurring within the last 10 years:  No If all of the above answers are "N   Hctz [Hydrochlorothiazide] Other (See Comments)    03/2012 K+ 3.3 03/2012 K+ 3.3   Sulfamethoxazole-Trimethoprim Rash    H/a's, heart racing & flu symptoms   Family History  Problem Relation Age of Onset   Thyroid cancer Mother    Prostate cancer Father    Heart disease Father 45       angioplasty    Colon polyps Father    Diabetes Father    Other Father 71       carcinoid tumor    Colon cancer Father    Diabetes Paternal Aunt        1   Diabetes Paternal Uncle         2   Colon polyps Sister    Muscular dystrophy Sister    Stroke Neg Hx    Esophageal cancer Neg Hx    Liver cancer Neg Hx    Pancreatic cancer Neg Hx    Rectal cancer Neg Hx    Stomach cancer Neg Hx     Current Outpatient Medications (Endocrine & Metabolic):    levothyroxine (SYNTHROID) 50 MCG tablet, TAKE 2 TABLETS BY MOUTH EVERY SATURDAY AND SUNDAY, THEN 1 TABLET ON MONDAY THROUGH FRIDAY   Current Outpatient Medications (Respiratory):    cetirizine (ZYRTEC) 10 MG tablet, Take 10 mg by mouth daily.  Current Outpatient Medications (Analgesics):    aspirin EC 81 MG tablet, Take 81 mg by mouth daily.   Current Outpatient Medications (Other):    Biotin 5000 MCG TABS, Take 10,000 mcg/day by mouth daily.   cholecalciferol (VITAMIN D) 25 MCG (1000 UNIT) tablet, 1 tablet   Multiple Vitamins-Minerals (CENTRUM SILVER PO), Take 1 tablet by mouth daily.   Omega-3 Fatty Acids (FISH OIL PO), Take 1,200 mg by mouth daily.   OVER THE COUNTER MEDICATION, Tumeric Curcumin 500 mg. One capsule daily.   polyethylene glycol powder (MIRALAX) 17 GM/SCOOP powder, Take 17 g by mouth daily.   valACYclovir (VALTREX) 1000 MG tablet, Take 2,000 mg by mouth 2 (two) times daily as needed.   Reviewed prior external information including notes and imaging from  primary care provider As well as notes that were available from care everywhere and other healthcare systems.  Past medical  history, social, surgical and family history all reviewed in electronic medical record.  No pertanent information unless stated regarding to the chief complaint.   Review of Systems:  No headache, visual changes, nausea, vomiting, diarrhea, constipation, dizziness, abdominal pain, skin rash, fevers, chills, night sweats, weight loss, swollen lymph nodes, body aches, joint swelling, chest pain, shortness of breath, mood changes. POSITIVE muscle aches  Objective  Blood pressure 110/76, pulse 62, height 5\' 8"  (1.727 m), weight 155 lb (70.3 kg), last menstrual period 07/19/2015, SpO2 96 %.   General: No apparent distress alert and oriented  x3 mood and affect normal, dressed appropriately.  HEENT: Pupils equal, extraocular movements intact  Respiratory: Patient's speak in full sentences and does not appear short of breath  Cardiovascular: No lower extremity edema, non tender, no erythema  Patient back does have some loss lordosis.  Still some tenderness over the right greater trochanteric area but not as severe.  Sitting comfortably in the chair    Impression and Recommendations:    The above documentation has been reviewed and is accurate and complete Judi Saa, DO

## 2022-10-27 ENCOUNTER — Other Ambulatory Visit: Payer: Self-pay

## 2022-10-27 ENCOUNTER — Ambulatory Visit: Payer: 59 | Admitting: Family Medicine

## 2022-10-27 ENCOUNTER — Encounter: Payer: Self-pay | Admitting: Family Medicine

## 2022-10-27 VITALS — BP 110/76 | HR 62 | Ht 68.0 in | Wt 155.0 lb

## 2022-10-27 DIAGNOSIS — M25551 Pain in right hip: Secondary | ICD-10-CM | POA: Diagnosis not present

## 2022-10-27 DIAGNOSIS — M545 Low back pain, unspecified: Secondary | ICD-10-CM | POA: Diagnosis not present

## 2022-10-27 DIAGNOSIS — M5136 Other intervertebral disc degeneration, lumbar region: Secondary | ICD-10-CM

## 2022-10-27 DIAGNOSIS — M51369 Other intervertebral disc degeneration, lumbar region without mention of lumbar back pain or lower extremity pain: Secondary | ICD-10-CM

## 2022-10-27 NOTE — Assessment & Plan Note (Signed)
Do believe that some of the hip pain seems to be more secondary to the degenerative disc disease of the lumbar spine.  Decided to start with over-the-counter medications and formal physical therapy.  See how patient responds. Discussed with patient about icing regimen and home exercises, discussed which activities to do and which ones to avoid.  Discussed increasing activity follow-up with me again 6 weeks.

## 2022-10-27 NOTE — Patient Instructions (Addendum)
Good to see you PT  [place] on left side at home 3 ibuprofen 3x a day for 3 days for flares [employer] for chairs See you again in 2 months

## 2022-11-08 ENCOUNTER — Other Ambulatory Visit: Payer: Self-pay

## 2022-11-08 ENCOUNTER — Ambulatory Visit: Payer: 59 | Attending: Family Medicine

## 2022-11-08 DIAGNOSIS — G8929 Other chronic pain: Secondary | ICD-10-CM | POA: Insufficient documentation

## 2022-11-08 DIAGNOSIS — R293 Abnormal posture: Secondary | ICD-10-CM | POA: Insufficient documentation

## 2022-11-08 DIAGNOSIS — M25551 Pain in right hip: Secondary | ICD-10-CM | POA: Diagnosis present

## 2022-11-08 DIAGNOSIS — M545 Low back pain, unspecified: Secondary | ICD-10-CM | POA: Diagnosis present

## 2022-11-08 NOTE — Therapy (Signed)
OUTPATIENT PHYSICAL THERAPY LOWER EXTREMITY EVALUATION   Patient Name: Emily Anderson MRN: [id number] DOB:[birth year], 62 y.o., female Today's Date: 11/08/2022  END OF SESSION:  PT End of Session - 11/08/22 1437     Visit Number 1    Date for PT Re-Evaluation 01/03/23    Progress Note Due on Visit 10    PT Start Time 1434    PT Stop Time 1515    PT Time Calculation (min) 41 min             Past Medical History:  Diagnosis Date   Allergy    Dyspepsia    food triggers   GERD (gastroesophageal reflux disease)    Heart murmur    Hyperlipidemia    Hypertension    Hypothyroidism    Past Surgical History:  Procedure Laterality Date   ABLATION     CESAREAN SECTION     G 2 P 2, both C sections   COLONOSCOPY  2009   negative; [facility]   LAPAROSCOPIC VAGINAL HYSTERECTOMY WITH SALPINGECTOMY Bilateral 08/02/2015   Procedure: LAPAROSCOPIC ASSISTED VAGINAL HYSTERECTOMY WITH SALPINGECTOMY;  Surgeon: Huel Cote, MD;  Location: [facility];  Service: Gynecology;  Laterality: Bilateral;   Patient Active Problem List   Diagnosis Date Noted   Abnormal EKG 07/21/2022   Hair loss 07/21/2022   Brain fog 07/18/2021   Vertigo 12/03/2020   Dysphagia 07/10/2018   Prediabetes 07/09/2018   Palpitations 03/25/2018   Family history of diabetes mellitus (DM) 03/25/2018   Degenerative disc disease, lumbar 09/10/2017   Nonallopathic lesion of lumbosacral region 06/29/2017   Nonallopathic lesion of sacral region 06/29/2017   Nonallopathic lesion of thoracic region 06/29/2017   Greater trochanteric bursitis of both hips 05/28/2017   Family history of muscular dystrophy 05/11/2017   Arthralgia 05/11/2017   S/P laparoscopic assisted vaginal hysterectomy (LAVH) 08/02/2015   Hypothyroidism due to Hashimoto's thyroiditis 02/13/2015   Greater trochanteric bursitis of right hip 05/19/2014   Essential hypertension 07/19/2009   Hypercholesterolemia 08/01/2007    PCP: Cheryll Cockayne,  MD  REFERRING PROVIDER: Antoine Primas, MD  REFERRING DIAG:  R hip bursitis, LS spine pain  THERAPY DIAG:  Lateral pain of right hip  Abnormal posture  Chronic bilateral low back pain without sciatica  Rationale for Evaluation and Treatment: Rehabilitation  ONSET DATE: chronic, worse last 1 -2 years  SUBJECTIVE:   SUBJECTIVE STATEMENT: Reports long term R lateral hip pain but has progressively worsened.  The back pain is more recent, but pt reports she thinks due to compensation due to R hip. Not a consistent pattern with R hip.  Does have sharp pain R hip with sneezing or coughing  PERTINENT HISTORY: Long term R hip pain, saw MD, had bursa injection in May, improved for 1 to 2 days. Then back to same pain pattern PAIN:  Are you having pain? Yes: NPRS scale: 0 to 6/10 Pain location: R lateral hip over bursa and glut med Pain description: intermittent, not persistent pattern, ebbs and flows in frequency Aggravating factors: sharp pain with sneezing Relieving factors: sometimes stretches R hip flexors  PRECAUTIONS: None  WEIGHT BEARING RESTRICTIONS: No  FALLS:  Has patient fallen in last 6 months? No  LIVING ENVIRONMENT: Lives with: lives with their spouse Lives in: House/apartment Stairs: Yes: External: 11 steps; on right going up Has following equipment at home: None  OCCUPATION: Technical sales engineer at [employer]  PLOF: Independent  PATIENT GOALS: manage these Sx  NEXT MD VISIT: Antoine Primas, DO  OBJECTIVE:   DIAGNOSTIC FINDINGS: copied forward: No fracture or dislocation. No spondylolisthesis. Stable mild lumbar levoscoliosis apex L3. Narrowing of lumbar interspaces L1 -L5 with small anterior endplate spurs as before.   IMPRESSION: 1. No acute findings or significant interval change. 2. Multilevel lumbar degenerative disc disease.COPIED FORWARD: There is no evidence of acute hip fracture or dislocation. A corticated avulsion fragment from the right ischial  tubercle. There is no evidence of arthropathy or other focal bone abnormality.   IMPRESSION: 1. No acute findings. 2. Old right ischial tubercle avulsion.     Electronically Signed   By: Corlis Leak M.D.   On: 09/06/2022 10:39  PATIENT SURVEYS:  Modified Oswestry 13/50   COGNITION: Overall cognitive status: Within functional limits for tasks assessed     SENSATION: WFL  MUSCLE LENGTH: Hamstrings: Right wnl deg; Left wnl deg Maisie Fus test: Right wnl deg; Left wnl deg  POSTURE: decreased lumbar lordosis and R lateral pelvic shift, L iliac crest elevated 1/2", L trunk shift.    PALPATION: Point tender R gluteals, piriformis, R SITS bone , tender L glut med Segmental testing lumbar region without pain Non tender along SI jt line Lumbar ROM:  hyperflexible in all planes of movement , no pain reported Observation of lumbar region in sitting without deviation of LS spine noted LOWER EXTREMITY ROM:  Passive ROM Right eval Left eval  Hip flexion 110p! 130  Hip extension WNL WNL  Hip abduction WNL WNL  Hip adduction    Hip internal rotation -5P! WNL  Hip external rotation    Knee flexion    Knee extension    Ankle dorsiflexion    Ankle plantarflexion    Ankle inversion    Ankle eversion     (Blank rows = WFL)  LOWER EXTREMITY MMT:  MMT Right eval Left eval  Hip flexion P! 4/5   Hip extension    Hip abduction P! 3+/5   Hip adduction    Hip internal rotation    Hip external rotation    Knee flexion    Knee extension    Ankle dorsiflexion    Ankle plantarflexion    Ankle inversion    Ankle eversion     (Blank rows = WNL)  LOWER EXTREMITY SPECIAL TESTS:  Hip special tests: Luisa Hart (FABER) test: negative and Trendelenburg test: negative  FUNCTIONAL TESTS:  30 seconds chair stand test  GAIT: Distance walked: IN CLINIC no deficits noted Assistive device utilized: None Level of assistance: Complete Independence Comments: no specific deficits noted     TODAY'S TREATMENT:                                                                                                                              DATE: 11/08/22: Evaluation, provided with 1/8" lift in R shoe to correct for limb length discrepancy, postural asymmetry. Advised to use at work and with working out, yard work. Improved posture with lift in place  sitting and standing Reviewed home program patient has been performing as instructed by MD, advised to perform the bridging and tailor's stretch.  Advised to stop stretching R lateral hip and stop side lying hip abduction until inflammation reduced, able to perform side lying R hip abd without pain. Also advised to evaluate work surface as she states she has to rotate to R to view computer    PATIENT EDUCATION:  Education details: POC, goals Person educated: Patient Education method: Medical illustrator Education comprehension: verbalized understanding  HOME EXERCISE PROGRAM: Adjusted patient's current home program .    ASSESSMENT:  CLINICAL IMPRESSION: Patient is a 62 y.o female who was evaluated today by physical therapy due to chronic R lateral hip pain, and lower back pain. She is fit and performs cardiovascular conditioning and strengthening consistently.  She demonstrated overall excessive flexibility lumbar region, hamstrings. She presents with postural changes with height difference in iliac crest and greater trochanter of approximately 1/2" , higher on L.  Also pain R hip with end range hip flexion and IR, as well as weakness and pain with R hip abduction and flexion.  She does have tender pts and thickening B glut medius, R glut max, R piriformis.  Initially addressed her iliac crest height today with trial of insert R shoe with good correction noted.  Would progress in next visits with manual techniques to improve her gluteal tenderness as well as jt mobs R hip , and progress to isometric and eccentric training for R  glut med as her inflammation/ tissue irritability decreases.  May benefit from dry needling as well. If these techniques don't improve her Sx will need further SI assessment.  OBJECTIVE IMPAIRMENTS: decreased ROM, decreased strength, increased fascial restrictions, postural dysfunction, and pain.   ACTIVITY LIMITATIONS: carrying, sleeping, and driving  PARTICIPATION LIMITATIONS: driving, community activity, and occupation  PERSONAL FACTORS: Behavior pattern, Profession, and Time since onset of injury/illness/exacerbation are also affecting patient's functional outcome.   REHAB POTENTIAL: Good  CLINICAL DECISION MAKING: Stable/uncomplicated  EVALUATION COMPLEXITY: Low   GOALS: Goals reviewed with patient? Yes  SHORT TERM GOALS: Target date: 2 weeks, 11/22/22 I HEP Baseline: Goal status: INITIAL   LONG TERM GOALS: Target date: 01/03/23  Improve R hip abduction strength to 5/5 and pain free with resistance for improved gait, function Baseline: 4+ with pain Goal status: INITIAL  2.  Improve R hip ROM for flexion and IR to WNL for improved bed mobility, sitting tolerance Baseline: flexion 105, IR -5 Goal status: INITIAL  3.  Modified Oswestry 8/50 Baseline: 13/50 Goal status: INITIAL  4.  30 sec wit to stand 13 reps Baseline: assess in future visits Goal status: INITIAL    PLAN:  PT FREQUENCY: 1-2x/week  PT DURATION: 8 weeks  PLANNED INTERVENTIONS: Therapeutic exercises, Therapeutic activity, Neuromuscular re-education, Balance training, Gait training, Patient/Family education, Self Care, and Joint mobilization  PLAN FOR NEXT SESSION: how did lift work in R shoe, jt mobs R hip for flexion, possible dry needle R post hip, initiate isometrics or eccentrics R lat hip musculature   Lazaro Isenhower L Melanny Wire, PT DPT,OCS 11/08/2022, 5:55 PM

## 2022-11-14 ENCOUNTER — Encounter: Payer: Self-pay | Admitting: Physical Therapy

## 2022-11-14 ENCOUNTER — Ambulatory Visit: Payer: 59 | Admitting: Physical Therapy

## 2022-11-14 DIAGNOSIS — M25551 Pain in right hip: Secondary | ICD-10-CM

## 2022-11-14 DIAGNOSIS — M545 Low back pain, unspecified: Secondary | ICD-10-CM

## 2022-11-14 DIAGNOSIS — R293 Abnormal posture: Secondary | ICD-10-CM

## 2022-11-14 NOTE — Therapy (Signed)
OUTPATIENT PHYSICAL THERAPY LOWER EXTREMITY EVALUATION   Patient Name: Emily Anderson MRN: [id number] DOB:[birth year], 62 y.o., female Today's Date: 11/14/2022  END OF SESSION:  PT End of Session - 11/14/22 1259     Visit Number 2    Date for PT Re-Evaluation 01/03/23    PT Start Time 1300    PT Stop Time 1345    PT Time Calculation (min) 45 min    Activity Tolerance Patient tolerated treatment well    Behavior During Therapy WFL for tasks assessed/performed             Past Medical History:  Diagnosis Date   Allergy    Dyspepsia    food triggers   GERD (gastroesophageal reflux disease)    Heart murmur    Hyperlipidemia    Hypertension    Hypothyroidism    Past Surgical History:  Procedure Laterality Date   ABLATION     CESAREAN SECTION     G 2 P 2, both C sections   COLONOSCOPY  2009   negative; [facility]   LAPAROSCOPIC VAGINAL HYSTERECTOMY WITH SALPINGECTOMY Bilateral 08/02/2015   Procedure: LAPAROSCOPIC ASSISTED VAGINAL HYSTERECTOMY WITH SALPINGECTOMY;  Surgeon: Huel Cote, MD;  Location: [facility];  Service: Gynecology;  Laterality: Bilateral;   Patient Active Problem List   Diagnosis Date Noted   Abnormal EKG 07/21/2022   Hair loss 07/21/2022   Brain fog 07/18/2021   Vertigo 12/03/2020   Dysphagia 07/10/2018   Prediabetes 07/09/2018   Palpitations 03/25/2018   Family history of diabetes mellitus (DM) 03/25/2018   Degenerative disc disease, lumbar 09/10/2017   Nonallopathic lesion of lumbosacral region 06/29/2017   Nonallopathic lesion of sacral region 06/29/2017   Nonallopathic lesion of thoracic region 06/29/2017   Greater trochanteric bursitis of both hips 05/28/2017   Family history of muscular dystrophy 05/11/2017   Arthralgia 05/11/2017   S/P laparoscopic assisted vaginal hysterectomy (LAVH) 08/02/2015   Hypothyroidism due to Hashimoto's thyroiditis 02/13/2015   Greater trochanteric bursitis of right hip 05/19/2014   Essential  hypertension 07/19/2009   Hypercholesterolemia 08/01/2007    PCP: Cheryll Cockayne, MD  REFERRING PROVIDER: Antoine Primas, MD  REFERRING DIAG:  R hip bursitis, LS spine pain  THERAPY DIAG:  Lateral pain of right hip  Abnormal posture  Chronic bilateral low back pain without sciatica  Rationale for Evaluation and Treatment: Rehabilitation  ONSET DATE: chronic, worse last 1 -2 years  SUBJECTIVE:   SUBJECTIVE STATEMENT: Not too bad today.  PERTINENT HISTORY: Long term R hip pain, saw MD, had bursa injection in May, improved for 1 to 2 days. Then back to same pain pattern PAIN:  Are you having pain? Yes: NPRS scale: 1/10 Pain location: R lateral hip over bursa and glut med Pain description: intermittent, not persistent pattern, ebbs and flows in frequency Aggravating factors: sharp pain with sneezing Relieving factors: sometimes stretches R hip flexors  PRECAUTIONS: None  WEIGHT BEARING RESTRICTIONS: No  FALLS:  Has patient fallen in last 6 months? No  LIVING ENVIRONMENT: Lives with: lives with their spouse Lives in: House/apartment Stairs: Yes: External: 11 steps; on right going up Has following equipment at home: None  OCCUPATION: Technical sales engineer at [employer]  PLOF: Independent  PATIENT GOALS: manage these Sx  NEXT MD VISIT: Antoine Primas, DO  OBJECTIVE:   DIAGNOSTIC FINDINGS: copied forward: No fracture or dislocation. No spondylolisthesis. Stable mild lumbar levoscoliosis apex L3. Narrowing of lumbar interspaces L1 -L5 with small anterior endplate spurs as before.   IMPRESSION:  1. No acute findings or significant interval change. 2. Multilevel lumbar degenerative disc disease.COPIED FORWARD: There is no evidence of acute hip fracture or dislocation. A corticated avulsion fragment from the right ischial tubercle. There is no evidence of arthropathy or other focal bone abnormality.   IMPRESSION: 1. No acute findings. 2. Old right ischial tubercle  avulsion.     Electronically Signed   By: Corlis Leak M.D.   On: 09/06/2022 10:39  PATIENT SURVEYS:  Modified Oswestry 13/50   COGNITION: Overall cognitive status: Within functional limits for tasks assessed     SENSATION: WFL  MUSCLE LENGTH: Hamstrings: Right wnl deg; Left wnl deg Maisie Fus test: Right wnl deg; Left wnl deg  POSTURE: decreased lumbar lordosis and R lateral pelvic shift, L iliac crest elevated 1/2", L trunk shift.    PALPATION: Point tender R gluteals, piriformis, R SITS bone , tender L glut med Segmental testing lumbar region without pain Non tender along SI jt line Lumbar ROM:  hyperflexible in all planes of movement , no pain reported Observation of lumbar region in sitting without deviation of LS spine noted LOWER EXTREMITY ROM:  Passive ROM Right eval Left eval  Hip flexion 110p! 130  Hip extension WNL WNL  Hip abduction WNL WNL  Hip adduction    Hip internal rotation -5P! WNL  Hip external rotation    Knee flexion    Knee extension    Ankle dorsiflexion    Ankle plantarflexion    Ankle inversion    Ankle eversion     (Blank rows = WFL)  LOWER EXTREMITY MMT:  MMT Right eval Left eval  Hip flexion P! 4/5   Hip extension    Hip abduction P! 3+/5   Hip adduction    Hip internal rotation    Hip external rotation    Knee flexion    Knee extension    Ankle dorsiflexion    Ankle plantarflexion    Ankle inversion    Ankle eversion     (Blank rows = WNL)  LOWER EXTREMITY SPECIAL TESTS:  Hip special tests: Luisa Hart (FABER) test: negative and Trendelenburg test: negative  FUNCTIONAL TESTS:  30 seconds chair stand test  GAIT: Distance walked: IN CLINIC no deficits noted Assistive device utilized: None Level of assistance: Complete Independence Comments: no specific deficits noted    TODAY'S TREATMENT:                                                                                                                              DATE:   11/14/22 NuStep L 5 x 6 min R hip PROM in all directions with end range holds Bridges 2x10 Advised K2C stretch in the morning LE on [place] bridges, oblq, K2C RLE SLR and Abd x5 some R hip twinging STM ti R GT area  Ionto R GT 1mL dex 4 hour patch  11/08/22: Evaluation, provided with 1/8" lift in R shoe to correct for limb  length discrepancy, postural asymmetry. Advised to use at work and with working out, yard work. Improved posture with lift in place sitting and standing Reviewed home program patient has been performing as instructed by MD, advised to perform the bridging and tailor's stretch.  Advised to stop stretching R lateral hip and stop side lying hip abduction until inflammation reduced, able to perform side lying R hip abd without pain. Also advised to evaluate work surface as she states she has to rotate to R to view computer    PATIENT EDUCATION:  Education details: POC, goals Person educated: Patient Education method: Medical illustrator Education comprehension: verbalized understanding  HOME EXERCISE PROGRAM: Adjusted patient's current home program .    ASSESSMENT:  CLINICAL IMPRESSION: Patient is a 62 y.o female who was evaluated today by physical therapy due to chronic R lateral hip pain, and lower back pain. Pt expressed that her back pain was moe recent due to cleaning out her gutters with back pack blower doing up and down a ladder. She enters feeling well. No issue PROM or stretching. Posterior R hip  "Twinge" reported with SL and abd, STM did help with that. Spoke to lead PT about adding ionto to POC.    OBJECTIVE IMPAIRMENTS: decreased ROM, decreased strength, increased fascial restrictions, postural dysfunction, and pain.   ACTIVITY LIMITATIONS: carrying, sleeping, and driving  PARTICIPATION LIMITATIONS: driving, community activity, and occupation  PERSONAL FACTORS: Behavior pattern, Profession, and Time since onset of injury/illness/exacerbation  are also affecting patient's functional outcome.   REHAB POTENTIAL: Good  CLINICAL DECISION MAKING: Stable/uncomplicated  EVALUATION COMPLEXITY: Low   GOALS: Goals reviewed with patient? Yes  SHORT TERM GOALS: Target date: 2 weeks, 11/22/22 I HEP Baseline: Goal status: INITIAL   LONG TERM GOALS: Target date: 01/03/23  Improve R hip abduction strength to 5/5 and pain free with resistance for improved gait, function Baseline: 4+ with pain Goal status: INITIAL  2.  Improve R hip ROM for flexion and IR to WNL for improved bed mobility, sitting tolerance Baseline: flexion 105, IR -5 Goal status: INITIAL  3.  Modified Oswestry 8/50 Baseline: 13/50 Goal status: INITIAL  4.  30 sec wit to stand 13 reps Baseline: assess in future visits Goal status: INITIAL    PLAN:  PT FREQUENCY: 1-2x/week  PT DURATION: 8 weeks  PLANNED INTERVENTIONS: Therapeutic exercises, Therapeutic activity, Neuromuscular re-education, Balance training, Gait training, Patient/Family education, Self Care, and Joint mobilization  PLAN FOR NEXT SESSION: how did lift work in R shoe, jt mobs R hip for flexion, possible dry needle R post hip, initiate isometrics or eccentrics R lat hip musculature   Grayce Sessions, PTA  11/14/2022, 1:01 PM  Stacie Glaze, PT

## 2022-11-15 ENCOUNTER — Ambulatory Visit: Payer: 59

## 2022-11-16 ENCOUNTER — Encounter: Payer: Self-pay | Admitting: Physical Therapy

## 2022-11-16 ENCOUNTER — Ambulatory Visit: Payer: 59 | Admitting: Physical Therapy

## 2022-11-16 DIAGNOSIS — M25551 Pain in right hip: Secondary | ICD-10-CM | POA: Diagnosis not present

## 2022-11-16 DIAGNOSIS — R293 Abnormal posture: Secondary | ICD-10-CM

## 2022-11-16 NOTE — Therapy (Signed)
OUTPATIENT PHYSICAL THERAPY LOWER EXTREMITY TREATMENT   Patient Name: Emily Anderson MRN: [id number] DOB:[birth year], 62 y.o., female Today's Date: 11/16/2022  END OF SESSION:  PT End of Session - 11/16/22 1601     Visit Number 3    Date for PT Re-Evaluation 01/03/23    PT Start Time 1600    PT Stop Time 1645    PT Time Calculation (min) 45 min    Activity Tolerance Patient tolerated treatment well    Behavior During Therapy WFL for tasks assessed/performed             Past Medical History:  Diagnosis Date   Allergy    Dyspepsia    food triggers   GERD (gastroesophageal reflux disease)    Heart murmur    Hyperlipidemia    Hypertension    Hypothyroidism    Past Surgical History:  Procedure Laterality Date   ABLATION     CESAREAN SECTION     G 2 P 2, both C sections   COLONOSCOPY  2009   negative; [facility]   LAPAROSCOPIC VAGINAL HYSTERECTOMY WITH SALPINGECTOMY Bilateral 08/02/2015   Procedure: LAPAROSCOPIC ASSISTED VAGINAL HYSTERECTOMY WITH SALPINGECTOMY;  Surgeon: Huel Cote, MD;  Location: [facility];  Service: Gynecology;  Laterality: Bilateral;   Patient Active Problem List   Diagnosis Date Noted   Abnormal EKG 07/21/2022   Hair loss 07/21/2022   Brain fog 07/18/2021   Vertigo 12/03/2020   Dysphagia 07/10/2018   Prediabetes 07/09/2018   Palpitations 03/25/2018   Family history of diabetes mellitus (DM) 03/25/2018   Degenerative disc disease, lumbar 09/10/2017   Nonallopathic lesion of lumbosacral region 06/29/2017   Nonallopathic lesion of sacral region 06/29/2017   Nonallopathic lesion of thoracic region 06/29/2017   Greater trochanteric bursitis of both hips 05/28/2017   Family history of muscular dystrophy 05/11/2017   Arthralgia 05/11/2017   S/P laparoscopic assisted vaginal hysterectomy (LAVH) 08/02/2015   Hypothyroidism due to Hashimoto's thyroiditis 02/13/2015   Greater trochanteric bursitis of right hip 05/19/2014   Essential hypertension  07/19/2009   Hypercholesterolemia 08/01/2007    PCP: Cheryll Cockayne, MD  REFERRING PROVIDER: Antoine Primas, MD  REFERRING DIAG:  R hip bursitis, LS spine pain  THERAPY DIAG:  Lateral pain of right hip  Abnormal posture  Rationale for Evaluation and Treatment: Rehabilitation  ONSET DATE: chronic, worse last 1 -2 years  SUBJECTIVE:   SUBJECTIVE STATEMENT: "Felt great yesterday, today felt more of the twinges" No stabbing pain in hip, sore to the touch. Did a full workout at the gym  PERTINENT HISTORY: Long term R hip pain, saw MD, had bursa injection in May, improved for 1 to 2 days. Then back to same pain pattern PAIN:  Are you having pain? Yes: NPRS scale: 2/10 Pain location: R lateral hip over bursa and glut med Pain description: intermittent, not persistent pattern, ebbs and flows in frequency Aggravating factors: sharp pain with sneezing Relieving factors: sometimes stretches R hip flexors  PRECAUTIONS: None  WEIGHT BEARING RESTRICTIONS: No  FALLS:  Has patient fallen in last 6 months? No  LIVING ENVIRONMENT: Lives with: lives with their spouse Lives in: House/apartment Stairs: Yes: External: 11 steps; on right going up Has following equipment at home: None  OCCUPATION: Technical sales engineer at [employer]  PLOF: Independent  PATIENT GOALS: manage these Sx  NEXT MD VISIT: Antoine Primas, DO  OBJECTIVE:   DIAGNOSTIC FINDINGS: copied forward: No fracture or dislocation. No spondylolisthesis. Stable mild lumbar levoscoliosis apex L3. Narrowing of lumbar  interspaces L1 -L5 with small anterior endplate spurs as before.   IMPRESSION: 1. No acute findings or significant interval change. 2. Multilevel lumbar degenerative disc disease.COPIED FORWARD: There is no evidence of acute hip fracture or dislocation. A corticated avulsion fragment from the right ischial tubercle. There is no evidence of arthropathy or other focal bone abnormality.   IMPRESSION: 1. No acute  findings. 2. Old right ischial tubercle avulsion.     Electronically Signed   By: Corlis Leak M.D.   On: 09/06/2022 10:39  PATIENT SURVEYS:  Modified Oswestry 13/50   COGNITION: Overall cognitive status: Within functional limits for tasks assessed     SENSATION: WFL  MUSCLE LENGTH: Hamstrings: Right wnl deg; Left wnl deg Maisie Fus test: Right wnl deg; Left wnl deg  POSTURE: decreased lumbar lordosis and R lateral pelvic shift, L iliac crest elevated 1/2", L trunk shift.    PALPATION: Point tender R gluteals, piriformis, R SITS bone , tender L glut med Segmental testing lumbar region without pain Non tender along SI jt line Lumbar ROM:  hyperflexible in all planes of movement , no pain reported Observation of lumbar region in sitting without deviation of LS spine noted LOWER EXTREMITY ROM:  Passive ROM Right eval Left eval  Hip flexion 110p! 130  Hip extension WNL WNL  Hip abduction WNL WNL  Hip adduction    Hip internal rotation -5P! WNL  Hip external rotation    Knee flexion    Knee extension    Ankle dorsiflexion    Ankle plantarflexion    Ankle inversion    Ankle eversion     (Blank rows = WFL)  LOWER EXTREMITY MMT:  MMT Right eval Left eval  Hip flexion P! 4/5   Hip extension    Hip abduction P! 3+/5   Hip adduction    Hip internal rotation    Hip external rotation    Knee flexion    Knee extension    Ankle dorsiflexion    Ankle plantarflexion    Ankle inversion    Ankle eversion     (Blank rows = WNL)  LOWER EXTREMITY SPECIAL TESTS:  Hip special tests: Luisa Hart (FABER) test: negative and Trendelenburg test: negative  FUNCTIONAL TESTS:  30 seconds chair stand test  GAIT: Distance walked: IN CLINIC no deficits noted Assistive device utilized: None Level of assistance: Complete Independence Comments: no specific deficits noted    TODAY'S TREATMENT:                                                                                                                               DATE:  11/16/22 NuStep L5 x 6 min Resisted gait 30lb 4 way x 3 each Leg press 30lb 2x12  R hip PROM in all directions with end range holds LE on Pball bridges, oblq, K2C Hooklying clams green 2x12   11/14/22 NuStep L 5 x 6 min R hip PROM in all directions with  end range holds Bridges 2x10 Advised K2C stretch in the morning LE on Pball bridges, oblq, K2C RLE SLR and Abd x5 some R hip twinging STM ti R GT area  Ionto R GT 1mL dex 4 hour patch  11/08/22: Evaluation, provided with 1/8" lift in R shoe to correct for limb length discrepancy, postural asymmetry. Advised to use at work and with working out, yard work. Improved posture with lift in place sitting and standing Reviewed home program patient has been performing as instructed by MD, advised to perform the bridging and tailor's stretch.  Advised to stop stretching R lateral hip and stop side lying hip abduction until inflammation reduced, able to perform side lying R hip abd without pain. Also advised to evaluate work surface as she states she has to rotate to R to view computer    PATIENT EDUCATION:  Education details: POC, goals Person educated: Patient Education method: Medical illustrator Education comprehension: verbalized understanding  HOME EXERCISE PROGRAM: Adjusted patient's current home program .    ASSESSMENT:  CLINICAL IMPRESSION: Patient is a 62 y.o female who was seen today by physical therapy due to chronic R lateral hip pain, and lower back pain. She reports a positive response to ionto so that was continues. Added mote hip strengthening in all planes without issue. R hip fatigue reported with activity with slight burning. Pt does reports a pull with ITB stretch. She has returned to full workouts at the gym   OBJECTIVE IMPAIRMENTS: decreased ROM, decreased strength, increased fascial restrictions, postural dysfunction, and pain.   ACTIVITY LIMITATIONS: carrying,  sleeping, and driving  PARTICIPATION LIMITATIONS: driving, community activity, and occupation  PERSONAL FACTORS: Behavior pattern, Profession, and Time since onset of injury/illness/exacerbation are also affecting patient's functional outcome.   REHAB POTENTIAL: Good  CLINICAL DECISION MAKING: Stable/uncomplicated  EVALUATION COMPLEXITY: Low   GOALS: Goals reviewed with patient? Yes  SHORT TERM GOALS: Target date: 2 weeks, 11/22/22 I HEP Baseline: Goal status: INITIAL   LONG TERM GOALS: Target date: 01/03/23  Improve R hip abduction strength to 5/5 and pain free with resistance for improved gait, function Baseline: 4+ with pain Goal status: INITIAL  2.  Improve R hip ROM for flexion and IR to WNL for improved bed mobility, sitting tolerance Baseline: flexion 105, IR -5 Goal status: INITIAL  3.  Modified Oswestry 8/50 Baseline: 13/50 Goal status: INITIAL  4.  30 sec wit to stand 13 reps Baseline: assess in future visits Goal status: INITIAL    PLAN:  PT FREQUENCY: 1-2x/week  PT DURATION: 8 weeks  PLANNED INTERVENTIONS: Therapeutic exercises, Therapeutic activity, Neuromuscular re-education, Balance training, Gait training, Patient/Family education, Self Care, and Joint mobilization  PLAN FOR NEXT SESSION: how did lift work in R shoe, jt mobs R hip for flexion, possible dry needle R post hip, initiate isometrics or eccentrics R lat hip musculature   Grayce Sessions, PTA  11/16/2022, 4:01 PM  Stacie Glaze, PT

## 2022-11-20 ENCOUNTER — Ambulatory Visit: Payer: 59

## 2022-11-20 DIAGNOSIS — M25551 Pain in right hip: Secondary | ICD-10-CM | POA: Diagnosis not present

## 2022-11-20 DIAGNOSIS — R293 Abnormal posture: Secondary | ICD-10-CM

## 2022-11-20 DIAGNOSIS — G8929 Other chronic pain: Secondary | ICD-10-CM

## 2022-11-20 NOTE — Therapy (Signed)
OUTPATIENT PHYSICAL THERAPY LOWER EXTREMITY TREATMENT   Patient Name: Emily Anderson MRN: [id number] DOB:March 07, 1961, 62 y.o., female Today's Date: 11/20/2022  END OF SESSION:  PT End of Session - 11/20/22 1707     Visit Number 4    Date for PT Re-Evaluation 01/03/23    PT Start Time 1515    PT Stop Time 1605    PT Time Calculation (min) 50 min    Activity Tolerance Patient tolerated treatment well    Behavior During Therapy WFL for tasks assessed/performed              Past Medical History:  Diagnosis Date   Allergy    Dyspepsia    food triggers   GERD (gastroesophageal reflux disease)    Heart murmur    Hyperlipidemia    Hypertension    Hypothyroidism    Past Surgical History:  Procedure Laterality Date   ABLATION     CESAREAN SECTION     G 2 P 2, both C sections   COLONOSCOPY  2009   negative; [facility]   LAPAROSCOPIC VAGINAL HYSTERECTOMY WITH SALPINGECTOMY Bilateral 08/02/2015   Procedure: LAPAROSCOPIC ASSISTED VAGINAL HYSTERECTOMY WITH SALPINGECTOMY;  Surgeon: Huel Cote, MD;  Location: [facility];  Service: Gynecology;  Laterality: Bilateral;   Patient Active Problem List   Diagnosis Date Noted   Abnormal EKG 07/21/2022   Hair loss 07/21/2022   Brain fog 07/18/2021   Vertigo 12/03/2020   Dysphagia 07/10/2018   Prediabetes 07/09/2018   Palpitations 03/25/2018   Family history of diabetes mellitus (DM) 03/25/2018   Degenerative disc disease, lumbar 09/10/2017   Nonallopathic lesion of lumbosacral region 06/29/2017   Nonallopathic lesion of sacral region 06/29/2017   Nonallopathic lesion of thoracic region 06/29/2017   Greater trochanteric bursitis of both hips 05/28/2017   Family history of muscular dystrophy 05/11/2017   Arthralgia 05/11/2017   S/P laparoscopic assisted vaginal hysterectomy (LAVH) 08/02/2015   Hypothyroidism due to Hashimoto's thyroiditis 02/13/2015   Greater trochanteric bursitis of right hip 05/19/2014   Essential  hypertension 07/19/2009   Hypercholesterolemia 08/01/2007    PCP: Cheryll Cockayne, MD  REFERRING PROVIDER: Antoine Primas, MD  REFERRING DIAG:  R hip bursitis, LS spine pain  THERAPY DIAG:  Lateral pain of right hip  Abnormal posture  Chronic bilateral low back pain without sciatica  Rationale for Evaluation and Treatment: Rehabilitation  ONSET DATE: chronic, worse last 1 -2 years  SUBJECTIVE:   SUBJECTIVE STATEMENT: "Felt great yesterday, today felt more of the twinges" No stabbing pain in hip, sore to the touch. Did a full workout at the gym  PERTINENT HISTORY: Long term R hip pain, saw MD, had bursa injection in May, improved for 1 to 2 days. Then back to same pain pattern PAIN:  Are you having pain? Yes: NPRS scale: 2/10 Pain location: R lateral hip over bursa and glut med Pain description: intermittent, not persistent pattern, ebbs and flows in frequency Aggravating factors: sharp pain with sneezing Relieving factors: sometimes stretches R hip flexors  PRECAUTIONS: None  WEIGHT BEARING RESTRICTIONS: No  FALLS:  Has patient fallen in last 6 months? No  LIVING ENVIRONMENT: Lives with: lives with their spouse Lives in: House/apartment Stairs: Yes: External: 11 steps; on right going up Has following equipment at home: None  OCCUPATION: Technical sales engineer at [employer]  PLOF: Independent  PATIENT GOALS: manage these Sx  NEXT MD VISIT: Antoine Primas, DO  OBJECTIVE:   DIAGNOSTIC FINDINGS: copied forward: No fracture or dislocation. No spondylolisthesis.  Stable mild lumbar levoscoliosis apex L3. Narrowing of lumbar interspaces L1 -L5 with small anterior endplate spurs as before.   IMPRESSION: 1. No acute findings or significant interval change. 2. Multilevel lumbar degenerative disc disease.COPIED FORWARD: There is no evidence of acute hip fracture or dislocation. A corticated avulsion fragment from the right ischial tubercle. There is no evidence of  arthropathy or other focal bone abnormality.   IMPRESSION: 1. No acute findings. 2. Old right ischial tubercle avulsion.     Electronically Signed   By: Corlis Leak M.D.   On: 09/06/2022 10:39  PATIENT SURVEYS:  Modified Oswestry 13/50   COGNITION: Overall cognitive status: Within functional limits for tasks assessed     SENSATION: WFL  MUSCLE LENGTH: Hamstrings: Right wnl deg; Left wnl deg Maisie Fus test: Right wnl deg; Left wnl deg  POSTURE: decreased lumbar lordosis and R lateral pelvic shift, L iliac crest elevated 1/2", L trunk shift.    PALPATION: Point tender R gluteals, piriformis, R SITS bone , tender L glut med Segmental testing lumbar region without pain Non tender along SI jt line Lumbar ROM:  hyperflexible in all planes of movement , no pain reported Observation of lumbar region in sitting without deviation of LS spine noted LOWER EXTREMITY ROM:  Passive ROM Right eval Left eval  Hip flexion 110p! 130  Hip extension WNL WNL  Hip abduction WNL WNL  Hip adduction    Hip internal rotation -5P! WNL  Hip external rotation    Knee flexion    Knee extension    Ankle dorsiflexion    Ankle plantarflexion    Ankle inversion    Ankle eversion     (Blank rows = WFL)  LOWER EXTREMITY MMT:  MMT Right eval Left eval  Hip flexion P! 4/5   Hip extension    Hip abduction P! 3+/5   Hip adduction    Hip internal rotation    Hip external rotation    Knee flexion    Knee extension    Ankle dorsiflexion    Ankle plantarflexion    Ankle inversion    Ankle eversion     (Blank rows = WNL)  LOWER EXTREMITY SPECIAL TESTS:  Hip special tests: Luisa Hart (FABER) test: negative and Trendelenburg test: negative  FUNCTIONAL TESTS:  30 seconds chair stand test  GAIT: Distance walked: IN CLINIC no deficits noted Assistive device utilized: None Level of assistance: Complete Independence Comments: no specific deficits noted    TODAY'S TREATMENT:                                                                                                                               DATE:  11/20/22:  Manual:  supine for mobilization with movement, lateral glides R femoral head, combined with supine R hip flexion pain free, 15 reps 3 sets  Trigger Point Dry-Needling  Treatment instructions: Expect mild to moderate muscle soreness. S/S of pneumothorax if dry needled over  a lung field, and to seek immediate medical attention should they occur. Patient verbalized understanding of these instructions and education. Patient Consent Given: Yes Education handout provided: No Muscles treated: R glut medius and R glut max , 3 pts total Parameters: N/A Treatment response/outcome: Twitch Response Elicited and Palpable Increase in Muscle Length   Deep cross friction massage R gluteals, in L side lying  after above techniques  Inst in prone hip extension, with slight abduction position to isolate R glut med, inst to place 2 pillows under abdomen and avoid hyperextension of Rhip and thereby lumbar lordosis  11/16/22 NuStep L5 x 6 min Resisted gait 30lb 4 way x 3 each Leg press 30lb 2x12  R hip PROM in all directions with end range holds LE on Pball bridges, oblq, K2C Hooklying clams green 2x12   11/14/22 NuStep L 5 x 6 min R hip PROM in all directions with end range holds Bridges 2x10 Advised K2C stretch in the morning LE on Pball bridges, oblq, K2C RLE SLR and Abd x5 some R hip twinging STM ti R GT area  Ionto R GT 1mL dex 4 hour patch  11/08/22: Evaluation, provided with 1/8" lift in R shoe to correct for limb length discrepancy, postural asymmetry. Advised to use at work and with working out, yard work. Improved posture with lift in place sitting and standing Reviewed home program patient has been performing as instructed by MD, advised to perform the bridging and tailor's stretch.  Advised to stop stretching R lateral hip and stop side lying hip abduction until  inflammation reduced, able to perform side lying R hip abd without pain. Also advised to evaluate work surface as she states she has to rotate to R to view computer    PATIENT EDUCATION:  Education details: POC, goals Person educated: Patient Education method: Medical illustrator Education comprehension: verbalized understanding  HOME EXERCISE PROGRAM: Adjusted patient's current home program .    ASSESSMENT:  CLINICAL IMPRESSION: Patient is a 62 y.o female who was seen today by physical therapy due to chronic R lateral hip pain, and lower back pain. Reassessed R hip abduction strength in side lying and able to perform vs gravity and resistance without pain.  Still restricted with R hip flexion motion ROM. Overall much improved.  Added other manual treatment techniques today to program to improve R hip ROM and soft tissue restrictions R post hip.  Did not perform ionto again today as non tender over greater trochanter.  Lift appears to be working in R shoe.  Not wearing at work, advised to maybe try a small lift in sitting at work station under R foot .   OBJECTIVE IMPAIRMENTS: decreased ROM, decreased strength, increased fascial restrictions, postural dysfunction, and pain.   ACTIVITY LIMITATIONS: carrying, sleeping, and driving  PARTICIPATION LIMITATIONS: driving, community activity, and occupation  PERSONAL FACTORS: Behavior pattern, Profession, and Time since onset of injury/illness/exacerbation are also affecting patient's functional outcome.   REHAB POTENTIAL: Good  CLINICAL DECISION MAKING: Stable/uncomplicated  EVALUATION COMPLEXITY: Low   GOALS: Goals reviewed with patient? Yes  SHORT TERM GOALS: Target date: 2 weeks, 11/22/22 I HEP Baseline: Goal status: INITIAL   LONG TERM GOALS: Target date: 01/03/23  Improve R hip abduction strength to 5/5 and pain free with resistance for improved gait, function Baseline: 4+ with pain Goal status: IN PROGRESS  10/31/22:  pain free today   2.  Improve R hip ROM for flexion and IR to WNL for improved bed mobility,  sitting tolerance Baseline: flexion 105, IR -5 Goal status: INITIAL  3.  Modified Oswestry 8/50 Baseline: 13/50 Goal status: INITIAL  4.  30 sec wit to stand 13 reps Baseline: assess in future visits Goal status: INITIAL    PLAN:  PT FREQUENCY: 1-2x/week  PT DURATION: 8 weeks  PLANNED INTERVENTIONS: Therapeutic exercises, Therapeutic activity, Neuromuscular re-education, Balance training, Gait training, Patient/Family education, Self Care, and Joint mobilization  PLAN FOR NEXT SESSION: how did lift work in R shoe, jt mobs R hip for flexion, possible dry needle R post hip, initiate isometrics or eccentrics R lat hip musculature   Jeriyah Granlund L Asaiah Hunnicutt, PT DPT [facility] 11/20/2022, 5:11 PM

## 2022-11-22 ENCOUNTER — Other Ambulatory Visit: Payer: Self-pay

## 2022-11-22 ENCOUNTER — Ambulatory Visit: Payer: 59

## 2022-11-22 DIAGNOSIS — M25551 Pain in right hip: Secondary | ICD-10-CM

## 2022-11-22 DIAGNOSIS — R293 Abnormal posture: Secondary | ICD-10-CM

## 2022-11-22 DIAGNOSIS — G8929 Other chronic pain: Secondary | ICD-10-CM

## 2022-11-22 NOTE — Therapy (Signed)
OUTPATIENT PHYSICAL THERAPY LOWER EXTREMITY TREATMENT   Patient Name: Emily Anderson MRN: [id number] DOB:[birth year], 62 y.o., female Today's Date: 11/22/2022  END OF SESSION:  PT End of Session - 11/22/22 1715     Visit Number 5    Date for PT Re-Evaluation 01/03/23    Progress Note Due on Visit 10    PT Start Time 1512    PT Stop Time 1600    PT Time Calculation (min) 48 min    Activity Tolerance Patient tolerated treatment well    Behavior During Therapy WFL for tasks assessed/performed              Past Medical History:  Diagnosis Date   Allergy    Dyspepsia    food triggers   GERD (gastroesophageal reflux disease)    Heart murmur    Hyperlipidemia    Hypertension    Hypothyroidism    Past Surgical History:  Procedure Laterality Date   ABLATION     CESAREAN SECTION     G 2 P 2, both C sections   COLONOSCOPY  2009   negative; [facility]   LAPAROSCOPIC VAGINAL HYSTERECTOMY WITH SALPINGECTOMY Bilateral 08/02/2015   Procedure: LAPAROSCOPIC ASSISTED VAGINAL HYSTERECTOMY WITH SALPINGECTOMY;  Surgeon: Huel Cote, MD;  Location: [facility];  Service: Gynecology;  Laterality: Bilateral;   Patient Active Problem List   Diagnosis Date Noted   Abnormal EKG 07/21/2022   Hair loss 07/21/2022   Brain fog 07/18/2021   Vertigo 12/03/2020   Dysphagia 07/10/2018   Prediabetes 07/09/2018   Palpitations 03/25/2018   Family history of diabetes mellitus (DM) 03/25/2018   Degenerative disc disease, lumbar 09/10/2017   Nonallopathic lesion of lumbosacral region 06/29/2017   Nonallopathic lesion of sacral region 06/29/2017   Nonallopathic lesion of thoracic region 06/29/2017   Greater trochanteric bursitis of both hips 05/28/2017   Family history of muscular dystrophy 05/11/2017   Arthralgia 05/11/2017   S/P laparoscopic assisted vaginal hysterectomy (LAVH) 08/02/2015   Hypothyroidism due to Hashimoto's thyroiditis 02/13/2015   Greater trochanteric bursitis of right hip  05/19/2014   Essential hypertension 07/19/2009   Hypercholesterolemia 08/01/2007    PCP: Cheryll Cockayne, MD  REFERRING PROVIDER: Antoine Primas, MD  REFERRING DIAG:  R hip bursitis, LS spine pain  THERAPY DIAG:  Lateral pain of right hip  Abnormal posture  Chronic bilateral low back pain without sciatica  Rationale for Evaluation and Treatment: Rehabilitation  ONSET DATE: chronic, worse last 1 -2 years  SUBJECTIVE:   SUBJECTIVE STATEMENT: "Felt great yesterday, today felt more of the twinges" No stabbing pain in hip, sore to the touch. Did a full workout at the gym  PERTINENT HISTORY: Long term R hip pain, saw MD, had bursa injection in May, improved for 1 to 2 days. Then back to same pain pattern PAIN:  Are you having pain? Yes: NPRS scale: 2/10 Pain location: R lateral hip over bursa and glut med Pain description: intermittent, not persistent pattern, ebbs and flows in frequency Aggravating factors: sharp pain with sneezing Relieving factors: sometimes stretches R hip flexors  PRECAUTIONS: None  WEIGHT BEARING RESTRICTIONS: No  FALLS:  Has patient fallen in last 6 months? No  LIVING ENVIRONMENT: Lives with: lives with their spouse Lives in: House/apartment Stairs: Yes: External: 11 steps; on right going up Has following equipment at home: None  OCCUPATION: Technical sales engineer at [employer]  PLOF: Independent  PATIENT GOALS: manage these Sx  NEXT MD VISIT: Antoine Primas, DO  OBJECTIVE:   DIAGNOSTIC  FINDINGS: copied forward: No fracture or dislocation. No spondylolisthesis. Stable mild lumbar levoscoliosis apex L3. Narrowing of lumbar interspaces L1 -L5 with small anterior endplate spurs as before.   IMPRESSION: 1. No acute findings or significant interval change. 2. Multilevel lumbar degenerative disc disease.COPIED FORWARD: There is no evidence of acute hip fracture or dislocation. A corticated avulsion fragment from the right ischial tubercle. There is  no evidence of arthropathy or other focal bone abnormality.   IMPRESSION: 1. No acute findings. 2. Old right ischial tubercle avulsion.     Electronically Signed   By: Corlis Leak M.D.   On: 09/06/2022 10:39  PATIENT SURVEYS:  Modified Oswestry 13/50   COGNITION: Overall cognitive status: Within functional limits for tasks assessed     SENSATION: WFL  MUSCLE LENGTH: Hamstrings: Right wnl deg; Left wnl deg Maisie Fus test: Right wnl deg; Left wnl deg  POSTURE: decreased lumbar lordosis and R lateral pelvic shift, L iliac crest elevated 1/2", L trunk shift.    PALPATION: Point tender R gluteals, piriformis, R SITS bone , tender L glut med Segmental testing lumbar region without pain Non tender along SI jt line Lumbar ROM:  hyperflexible in all planes of movement , no pain reported Observation of lumbar region in sitting without deviation of LS spine noted LOWER EXTREMITY ROM:  Passive ROM Right eval Left eval  Hip flexion 110p! 130  Hip extension WNL WNL  Hip abduction WNL WNL  Hip adduction    Hip internal rotation -5P! WNL  Hip external rotation    Knee flexion    Knee extension    Ankle dorsiflexion    Ankle plantarflexion    Ankle inversion    Ankle eversion     (Blank rows = WFL)  LOWER EXTREMITY MMT:  MMT Right eval Left eval  Hip flexion P! 4/5   Hip extension    Hip abduction P! 3+/5   Hip adduction    Hip internal rotation    Hip external rotation    Knee flexion    Knee extension    Ankle dorsiflexion    Ankle plantarflexion    Ankle inversion    Ankle eversion     (Blank rows = WNL)  LOWER EXTREMITY SPECIAL TESTS:  Hip special tests: Luisa Hart (FABER) test: negative and Trendelenburg test: negative  FUNCTIONAL TESTS:  30 seconds chair stand test  GAIT: Distance walked: IN CLINIC no deficits noted Assistive device utilized: None Level of assistance: Complete Independence Comments: no specific deficits noted    TODAY'S TREATMENT:                                                                                                                               DATE: 11/22/22: Manual:  supine for mobilization with movement, lateral glides R femoral head, combined with supine R hip flexion pain free, 15 reps 3 sets improved through session. Trigger Point Dry-Needling  Treatment instructions: Expect mild to moderate  muscle soreness. S/S of pneumothorax if dry needled over a lung field, and to seek immediate medical attention should they occur. Patient verbalized understanding of these instructions and education. Patient Consent Given: Yes Education handout provided: No Muscles treated: R glut max, R piriformis, close to SI jt line 3 pts total total Parameters: N/A Treatment response/outcome: Twitch Response Elicited and Palpable Increase in Muscle Length   Deep cross friction massage R gluteals, in L side lying  after above techniques  Iontophoresis R lateral hip over gr trochanter, more posterior.  For 80 mA min, pt to leave intact for 3 hrs post today;s session.  11/20/22:  Manual:  supine for mobilization with movement, lateral glides R femoral head, combined with supine R hip flexion pain free, 15 reps 3 sets  Trigger Point Dry-Needling  Treatment instructions: Expect mild to moderate muscle soreness. S/S of pneumothorax if dry needled over a lung field, and to seek immediate medical attention should they occur. Patient verbalized understanding of these instructions and education. Patient Consent Given: Yes Education handout provided: No Muscles treated: R glut medius and R glut max , 3 pts total Parameters: N/A Treatment response/outcome: Twitch Response Elicited and Palpable Increase in Muscle Length   Deep cross friction massage R gluteals, in L side lying  after above techniques  Inst in prone hip extension, with slight abduction position to isolate R glut med, inst to place 2 pillows under abdomen and avoid hyperextension  of Rhip and thereby lumbar lordosis  11/16/22 NuStep L5 x 6 min Resisted gait 30lb 4 way x 3 each Leg press 30lb 2x12  R hip PROM in all directions with end range holds LE on Pball bridges, oblq, K2C Hooklying clams green 2x12   11/14/22 NuStep L 5 x 6 min R hip PROM in all directions with end range holds Bridges 2x10 Advised K2C stretch in the morning LE on Pball bridges, oblq, K2C RLE SLR and Abd x5 some R hip twinging STM ti R GT area  Ionto R GT 1mL dex 4 hour patch  11/08/22: Evaluation, provided with 1/8" lift in R shoe to correct for limb length discrepancy, postural asymmetry. Advised to use at work and with working out, yard work. Improved posture with lift in place sitting and standing Reviewed home program patient has been performing as instructed by MD, advised to perform the bridging and tailor's stretch.  Advised to stop stretching R lateral hip and stop side lying hip abduction until inflammation reduced, able to perform side lying R hip abd without pain. Also advised to evaluate work surface as she states she has to rotate to R to view computer    PATIENT EDUCATION:  Education details: POC, goals Person educated: Patient Education method: Medical illustrator Education comprehension: verbalized understanding  HOME EXERCISE PROGRAM: Adjusted patient's current home program .    ASSESSMENT:  CLINICAL IMPRESSION: Patient is a 62 y.o female who was seen today by physical therapy due to chronic R lateral hip pain, and lower back pain. Continues to reports ongoing better ease of movement R hip and lower back.  "Sore" R gluteals, but les catching.  Noted improved R ip flexion ROM today , still not quite equal to L side.  Overall much improved.   Lift appears to be working in R shoe.  Not wearing at work, advised to maybe try a small lift in sitting at work station under R foot , reviewed again today.  No new ex added today OBJECTIVE IMPAIRMENTS: decreased  ROM,  decreased strength, increased fascial restrictions, postural dysfunction, and pain.   ACTIVITY LIMITATIONS: carrying, sleeping, and driving  PARTICIPATION LIMITATIONS: driving, community activity, and occupation  PERSONAL FACTORS: Behavior pattern, Profession, and Time since onset of injury/illness/exacerbation are also affecting patient's functional outcome.   REHAB POTENTIAL: Good  CLINICAL DECISION MAKING: Stable/uncomplicated  EVALUATION COMPLEXITY: Low   GOALS: Goals reviewed with patient? Yes  SHORT TERM GOALS: Target date: 2 weeks, 11/22/22 I HEP Baseline: Goal status: INITIAL   LONG TERM GOALS: Target date: 01/03/23  Improve R hip abduction strength to 5/5 and pain free with resistance for improved gait, function Baseline: 4+ with pain Goal status: IN PROGRESS  10/31/22: pain free today   2.  Improve R hip ROM for flexion and IR to WNL for improved bed mobility, sitting tolerance Baseline: flexion 105, IR -5 Goal status: INITIAL  3.  Modified Oswestry 8/50 Baseline: 13/50 Goal status: INITIAL  4.  30 sec wit to stand 13 reps Baseline: assess in future visits Goal status: INITIAL    PLAN:  PT FREQUENCY: 1-2x/week  PT DURATION: 8 weeks  PLANNED INTERVENTIONS: Therapeutic exercises, Therapeutic activity, Neuromuscular re-education, Balance training, Gait training, Patient/Family education, Self Care, and Joint mobilization  PLAN FOR NEXT SESSION: how did lift work in R shoe, jt mobs R hip for flexion, possible dry needle R post hip, initiate isometrics or eccentrics R lat hip musculature   Tahiry Spicer L Ory Elting, PT DPT [facility] 11/22/2022, 5:17 PM

## 2022-11-27 ENCOUNTER — Ambulatory Visit: Payer: 59 | Admitting: Physical Therapy

## 2022-11-29 ENCOUNTER — Ambulatory Visit: Payer: 59 | Admitting: Physical Therapy

## 2022-12-04 ENCOUNTER — Other Ambulatory Visit: Payer: Self-pay

## 2022-12-04 ENCOUNTER — Ambulatory Visit: Payer: 59 | Admitting: Physical Therapy

## 2022-12-04 ENCOUNTER — Ambulatory Visit: Payer: 59 | Attending: Family Medicine

## 2022-12-04 DIAGNOSIS — R293 Abnormal posture: Secondary | ICD-10-CM | POA: Diagnosis present

## 2022-12-04 DIAGNOSIS — G8929 Other chronic pain: Secondary | ICD-10-CM | POA: Insufficient documentation

## 2022-12-04 DIAGNOSIS — M545 Low back pain, unspecified: Secondary | ICD-10-CM | POA: Diagnosis present

## 2022-12-04 DIAGNOSIS — M25551 Pain in right hip: Secondary | ICD-10-CM | POA: Insufficient documentation

## 2022-12-04 NOTE — Therapy (Signed)
OUTPATIENT PHYSICAL THERAPY LOWER EXTREMITY TREATMENT   Patient Name: Emily Anderson MRN: [id number] DOB:[birth year], 62 y.o., female Today's Date: 12/04/2022  END OF SESSION:  PT End of Session - 12/04/22 1434     Visit Number 6    Date for PT Re-Evaluation 01/03/23    Progress Note Due on Visit 10    PT Start Time 1432    PT Stop Time 1515    PT Time Calculation (min) 43 min    Activity Tolerance Patient tolerated treatment well    Behavior During Therapy WFL for tasks assessed/performed               Past Medical History:  Diagnosis Date   Allergy    Dyspepsia    food triggers   GERD (gastroesophageal reflux disease)    Heart murmur    Hyperlipidemia    Hypertension    Hypothyroidism    Past Surgical History:  Procedure Laterality Date   ABLATION     CESAREAN SECTION     G 2 P 2, both C sections   COLONOSCOPY  2009   negative; [facility]   LAPAROSCOPIC VAGINAL HYSTERECTOMY WITH SALPINGECTOMY Bilateral 08/02/2015   Procedure: LAPAROSCOPIC ASSISTED VAGINAL HYSTERECTOMY WITH SALPINGECTOMY;  Surgeon: Huel Cote, MD;  Location: [facility];  Service: Gynecology;  Laterality: Bilateral;   Patient Active Problem List   Diagnosis Date Noted   Abnormal EKG 07/21/2022   Hair loss 07/21/2022   Brain fog 07/18/2021   Vertigo 12/03/2020   Dysphagia 07/10/2018   Prediabetes 07/09/2018   Palpitations 03/25/2018   Family history of diabetes mellitus (DM) 03/25/2018   Degenerative disc disease, lumbar 09/10/2017   Nonallopathic lesion of lumbosacral region 06/29/2017   Nonallopathic lesion of sacral region 06/29/2017   Nonallopathic lesion of thoracic region 06/29/2017   Greater trochanteric bursitis of both hips 05/28/2017   Family history of muscular dystrophy 05/11/2017   Arthralgia 05/11/2017   S/P laparoscopic assisted vaginal hysterectomy (LAVH) 08/02/2015   Hypothyroidism due to Hashimoto's thyroiditis 02/13/2015   Greater trochanteric bursitis of right hip  05/19/2014   Essential hypertension 07/19/2009   Hypercholesterolemia 08/01/2007    PCP: Cheryll Cockayne, MD  REFERRING PROVIDER: Antoine Primas, MD  REFERRING DIAG:  R hip bursitis, LS spine pain  THERAPY DIAG:  Lateral pain of right hip  Abnormal posture  Chronic bilateral low back pain without sciatica  Rationale for Evaluation and Treatment: Rehabilitation  ONSET DATE: chronic, worse last 1 -2 years  SUBJECTIVE:   SUBJECTIVE STATEMENT: "Felt great yesterday, today felt more of the twinges" No stabbing pain in hip, sore to the touch. Did a full workout at the gym  PERTINENT HISTORY: Long term R hip pain, saw MD, had bursa injection in May, improved for 1 to 2 days. Then back to same pain pattern PAIN:  Are you having pain? Yes: NPRS scale: 2/10 Pain location: R lateral hip over bursa and glut med Pain description: intermittent, not persistent pattern, ebbs and flows in frequency Aggravating factors: sharp pain with sneezing Relieving factors: sometimes stretches R hip flexors  PRECAUTIONS: None  WEIGHT BEARING RESTRICTIONS: No  FALLS:  Has patient fallen in last 6 months? No  LIVING ENVIRONMENT: Lives with: lives with their spouse Lives in: House/apartment Stairs: Yes: External: 11 steps; on right going up Has following equipment at home: None  OCCUPATION: Technical sales engineer at [employer]  PLOF: Independent  PATIENT GOALS: manage these Sx  NEXT MD VISIT: Antoine Primas, DO  OBJECTIVE:  DIAGNOSTIC FINDINGS: copied forward: No fracture or dislocation. No spondylolisthesis. Stable mild lumbar levoscoliosis apex L3. Narrowing of lumbar interspaces L1 -L5 with small anterior endplate spurs as before.   IMPRESSION: 1. No acute findings or significant interval change. 2. Multilevel lumbar degenerative disc disease.COPIED FORWARD: There is no evidence of acute hip fracture or dislocation. A corticated avulsion fragment from the right ischial tubercle. There is  no evidence of arthropathy or other focal bone abnormality.   IMPRESSION: 1. No acute findings. 2. Old right ischial tubercle avulsion.     Electronically Signed   By: Corlis Leak M.D.   On: 09/06/2022 10:39  PATIENT SURVEYS:  Modified Oswestry 13/50   COGNITION: Overall cognitive status: Within functional limits for tasks assessed     SENSATION: WFL  MUSCLE LENGTH: Hamstrings: Right wnl deg; Left wnl deg Maisie Fus test: Right wnl deg; Left wnl deg  POSTURE: decreased lumbar lordosis and R lateral pelvic shift, L iliac crest elevated 1/2", L trunk shift.    PALPATION: Point tender R gluteals, piriformis, R SITS bone , tender L glut med Segmental testing lumbar region without pain Non tender along SI jt line Lumbar ROM:  hyperflexible in all planes of movement , no pain reported Observation of lumbar region in sitting without deviation of LS spine noted LOWER EXTREMITY ROM:  Passive ROM Right eval Left eval  Hip flexion 110p! 130  Hip extension WNL WNL  Hip abduction WNL WNL  Hip adduction    Hip internal rotation -5P! WNL  Hip external rotation    Knee flexion    Knee extension    Ankle dorsiflexion    Ankle plantarflexion    Ankle inversion    Ankle eversion     (Blank rows = WFL)  LOWER EXTREMITY MMT:  MMT Right eval Left eval  Hip flexion P! 4/5   Hip extension    Hip abduction P! 3+/5   Hip adduction    Hip internal rotation    Hip external rotation    Knee flexion    Knee extension    Ankle dorsiflexion    Ankle plantarflexion    Ankle inversion    Ankle eversion     (Blank rows = WNL)  LOWER EXTREMITY SPECIAL TESTS:  Hip special tests: Luisa Hart (FABER) test: negative and Trendelenburg test: negative  FUNCTIONAL TESTS:  30 seconds chair stand test  GAIT: Distance walked: IN CLINIC no deficits noted Assistive device utilized: None Level of assistance: Complete Independence Comments: no specific deficits noted    TODAY'S TREATMENT:                                                                                                                               DATE: 12/04/22:  Manual:  reassessed standing posture, pt using 1/8" lift in R shoe, mild R pelvic lat shift, R iliac crest slightly lower than L Supine for mob with movement, lat glides R femoral head, combined  with flexion and IR  Cross friction massage:  very tender today R TFL , just superior to gr trochanter, with palpable tissue thickening and marked tenderness.   Trigger Point Dry-Needling  Treatment instructions: Expect mild to moderate muscle soreness. S/S of pneumothorax if dry needled over a lung field, and to seek immediate medical attention should they occur. Patient verbalized understanding of these instructions and education. Patient Consent Given: Yes Education handout provided: Previously provided Muscles treated: R TFL, 3 pts superior to Gr trochanter Treatment response/outcome: Twitch Response Elicited and Palpable Increase in Muscle Length  Instructed pt in self lateral glide R femoral head combined with R hip flexion lunge with R foot propped on chair.    Iontophoresis: 80 mam with 0.04% dexamethasome over tender site superior to R gr trochanter R hip.    11/22/22: Manual:  supine for mobilization with movement, lateral glides R femoral head, combined with supine R hip flexion pain free, 15 reps 3 sets improved through session. Trigger Point Dry-Needling  Treatment instructions: Expect mild to moderate muscle soreness. S/S of pneumothorax if dry needled over a lung field, and to seek immediate medical attention should they occur. Patient verbalized understanding of these instructions and education. Patient Consent Given: Yes Education handout provided: No Muscles treated: R glut max, R piriformis, close to SI jt line 3 pts total total Parameters: N/A Treatment response/outcome: Twitch Response Elicited and Palpable Increase in Muscle Length   Deep cross  friction massage R gluteals, in L side lying  after above techniques  Iontophoresis R lateral hip over gr trochanter, more posterior.  For 80 mA min, pt to leave intact for 3 hrs post today;s session.  11/20/22:  Manual:  supine for mobilization with movement, lateral glides R femoral head, combined with supine R hip flexion pain free, 15 reps 3 sets  Trigger Point Dry-Needling  Treatment instructions: Expect mild to moderate muscle soreness. S/S of pneumothorax if dry needled over a lung field, and to seek immediate medical attention should they occur. Patient verbalized understanding of these instructions and education. Patient Consent Given: Yes Education handout provided: No Muscles treated: R glut medius and R glut max , 3 pts total Parameters: N/A Treatment response/outcome: Twitch Response Elicited and Palpable Increase in Muscle Length   Deep cross friction massage R gluteals, in L side lying  after above techniques  Inst in prone hip extension, with slight abduction position to isolate R glut med, inst to place 2 pillows under abdomen and avoid hyperextension of Rhip and thereby lumbar lordosis  11/16/22 NuStep L5 x 6 min Resisted gait 30lb 4 way x 3 each Leg press 30lb 2x12  R hip PROM in all directions with end range holds LE on Pball bridges, oblq, K2C Hooklying clams green 2x12   11/14/22 NuStep L 5 x 6 min R hip PROM in all directions with end range holds Bridges 2x10 Advised K2C stretch in the morning LE on Pball bridges, oblq, K2C RLE SLR and Abd x5 some R hip twinging STM ti R GT area  Ionto R GT 1mL dex 4 hour patch  11/08/22: Evaluation, provided with 1/8" lift in R shoe to correct for limb length discrepancy, postural asymmetry. Advised to use at work and with working out, yard work. Improved posture with lift in place sitting and standing Reviewed home program patient has been performing as instructed by MD, advised to perform the bridging and tailor's stretch.   Advised to stop stretching R lateral hip and stop  side lying hip abduction until inflammation reduced, able to perform side lying R hip abd without pain. Also advised to evaluate work surface as she states she has to rotate to R to view computer    PATIENT EDUCATION:  Education details: POC, goals Person educated: Patient Education method: Medical illustrator Education comprehension: verbalized understanding  HOME EXERCISE PROGRAM: Adjusted patient's current home program .    ASSESSMENT:  CLINICAL IMPRESSION: Patient is a 62 y.o female who was seen today by physical therapy due to chronic R lateral hip pain, and lower back pain. Lower back pain improved, less tender glut medius, si jt R. Has had flare of R lateral hip pain, noted very thick, pt tender R TFL musculature just superior to R gr trochanter/bursa.  Improved R hip ROM noted today.  Focused on TFL musculature, will see again next week and reassess. OBJECTIVE IMPAIRMENTS: decreased ROM, decreased strength, increased fascial restrictions, postural dysfunction, and pain.   ACTIVITY LIMITATIONS: carrying, sleeping, and driving  PARTICIPATION LIMITATIONS: driving, community activity, and occupation  PERSONAL FACTORS: Behavior pattern, Profession, and Time since onset of injury/illness/exacerbation are also affecting patient's functional outcome.   REHAB POTENTIAL: Good  CLINICAL DECISION MAKING: Stable/uncomplicated  EVALUATION COMPLEXITY: Low   GOALS: Goals reviewed with patient? Yes  SHORT TERM GOALS: Target date: 2 weeks, 11/22/22 I HEP Baseline: Goal status: INITIAL   LONG TERM GOALS: Target date: 01/03/23  Improve R hip abduction strength to 5/5 and pain free with resistance for improved gait, function Baseline: 4+ with pain Goal status: IN PROGRESS  10/31/22: pain free today   2.  Improve R hip ROM for flexion and IR to WNL for improved bed mobility, sitting tolerance Baseline: flexion 105, IR -5 Goal  status: INITIAL  3.  Modified Oswestry 8/50 Baseline: 13/50 Goal status: INITIAL  4.  30 sec wit to stand 13 reps Baseline: assess in future visits Goal status: INITIAL    PLAN:  PT FREQUENCY: 1-2x/week  PT DURATION: 8 weeks  PLANNED INTERVENTIONS: Therapeutic exercises, Therapeutic activity, Neuromuscular re-education, Balance training, Gait training, Patient/Family education, Self Care, and Joint mobilization  PLAN FOR NEXT SESSION: how did lift work in R shoe, jt mobs R hip for flexion, possible dry needle R post hip, initiate isometrics or eccentrics R lat hip musculature   Jayland Null L Jarrod Mcenery, PT DPT [facility] 12/04/2022, 5:13 PM

## 2022-12-06 ENCOUNTER — Ambulatory Visit: Payer: 59

## 2022-12-13 ENCOUNTER — Ambulatory Visit: Payer: 59

## 2022-12-13 ENCOUNTER — Other Ambulatory Visit: Payer: Self-pay

## 2022-12-13 DIAGNOSIS — M25551 Pain in right hip: Secondary | ICD-10-CM

## 2022-12-13 DIAGNOSIS — R293 Abnormal posture: Secondary | ICD-10-CM

## 2022-12-13 DIAGNOSIS — M545 Low back pain, unspecified: Secondary | ICD-10-CM

## 2022-12-13 NOTE — Therapy (Signed)
OUTPATIENT PHYSICAL THERAPY LOWER EXTREMITY DISCHARGE SUMMARY  Progress Note Reporting Period 11/08/22 to 12/13/22  See note below for Objective Data and Assessment of Progress/Goals.     Patient Name: Emily Anderson MRN: [id number] DOB:1960/10/14, 62 y.o., female Today's Date: 12/13/2022  END OF SESSION:  PT End of Session - 12/13/22 1659     Visit Number 7    Date for PT Re-Evaluation 01/03/23    Progress Note Due on Visit 10    PT Start Time 1512    PT Stop Time 1537    PT Time Calculation (min) 25 min    Activity Tolerance Patient tolerated treatment well    Behavior During Therapy WFL for tasks assessed/performed                Past Medical History:  Diagnosis Date   Allergy    Dyspepsia    food triggers   GERD (gastroesophageal reflux disease)    Heart murmur    Hyperlipidemia    Hypertension    Hypothyroidism    Past Surgical History:  Procedure Laterality Date   ABLATION     CESAREAN SECTION     G 2 P 2, both C sections   COLONOSCOPY  2009   negative;  GI   LAPAROSCOPIC VAGINAL HYSTERECTOMY WITH SALPINGECTOMY Bilateral 08/02/2015   Procedure: LAPAROSCOPIC ASSISTED VAGINAL HYSTERECTOMY WITH SALPINGECTOMY;  Surgeon: Huel Cote, MD;  Location: [facility];  Service: Gynecology;  Laterality: Bilateral;   Patient Active Problem List   Diagnosis Date Noted   Abnormal EKG 07/21/2022   Hair loss 07/21/2022   Brain fog 07/18/2021   Vertigo 12/03/2020   Dysphagia 07/10/2018   Prediabetes 07/09/2018   Palpitations 03/25/2018   Family history of diabetes mellitus (DM) 03/25/2018   Degenerative disc disease, lumbar 09/10/2017   Nonallopathic lesion of lumbosacral region 06/29/2017   Nonallopathic lesion of sacral region 06/29/2017   Nonallopathic lesion of thoracic region 06/29/2017   Greater trochanteric bursitis of both hips 05/28/2017   Family history of muscular dystrophy 05/11/2017   Arthralgia 05/11/2017   S/P laparoscopic assisted  vaginal hysterectomy (LAVH) 08/02/2015   Hypothyroidism due to Hashimoto's thyroiditis 02/13/2015   Greater trochanteric bursitis of right hip 05/19/2014   Essential hypertension 07/19/2009   Hypercholesterolemia 08/01/2007    PCP: Cheryll Cockayne, MD  REFERRING PROVIDER: Antoine Primas, MD  REFERRING DIAG:  R hip bursitis, LS spine pain  THERAPY DIAG:  Lateral pain of right hip  Abnormal posture  Chronic bilateral low back pain without sciatica  Rationale for Evaluation and Treatment: Rehabilitation  ONSET DATE: chronic, worse last 1 -2 years  SUBJECTIVE:   SUBJECTIVE STATEMENT: BAD WEEK LAST WEEK, VERY SORE, INFLAMED AFTER SESSION FOR 3 DAYS.  HAVE GRADUALLY EASED BACK INTO NORMAL GYM ROUTINE.  MY r HIP SOMETIMES CATCHES AND sometimes is fine.  Lower back is normal now, no problems  PERTINENT HISTORY: Long term R hip pain, saw MD, had bursa injection in May, improved for 1 to 2 days. Then back to same pain pattern PAIN:  Are you having pain? Yes: NPRS scale: 2/10 Pain location: R lateral hip over bursa and glut med Pain description: intermittent, not persistent pattern, ebbs and flows in frequency Aggravating factors: sharp pain with sneezing Relieving factors: sometimes stretches R hip flexors  PRECAUTIONS: None  WEIGHT BEARING RESTRICTIONS: No  FALLS:  Has patient fallen in last 6 months? No  LIVING ENVIRONMENT: Lives with: lives with their spouse Lives in: House/apartment Stairs: Yes: External: 11  steps; on right going up Has following equipment at home: None  OCCUPATION: Technical sales engineer at [employer]  PLOF: Independent  PATIENT GOALS: manage these Sx  NEXT MD VISIT: Antoine Primas, DO  OBJECTIVE:   DIAGNOSTIC FINDINGS: copied forward: No fracture or dislocation. No spondylolisthesis. Stable mild lumbar levoscoliosis apex L3. Narrowing of lumbar interspaces L1 -L5 with small anterior endplate spurs as before.   IMPRESSION: 1. No acute findings or  significant interval change. 2. Multilevel lumbar degenerative disc disease.COPIED FORWARD: There is no evidence of acute hip fracture or dislocation. A corticated avulsion fragment from the right ischial tubercle. There is no evidence of arthropathy or other focal bone abnormality.   IMPRESSION: 1. No acute findings. 2. Old right ischial tubercle avulsion.     Electronically Signed   By: Corlis Leak M.D.   On: 09/06/2022 10:39  PATIENT SURVEYS:  Modified Oswestry 13/50   COGNITION: Overall cognitive status: Within functional limits for tasks assessed     SENSATION: WFL  MUSCLE LENGTH: Hamstrings: Right wnl deg; Left wnl deg Maisie Fus test: Right wnl deg; Left wnl deg  POSTURE: decreased lumbar lordosis and R lateral pelvic shift, L iliac crest elevated 1/2", L trunk shift.    PALPATION: Point tender R gluteals, piriformis, R SITS bone , tender L glut med Segmental testing lumbar region without pain Non tender along SI jt line Lumbar ROM:  hyperflexible in all planes of movement , no pain reported Observation of lumbar region in sitting without deviation of LS spine noted LOWER EXTREMITY ROM:  Passive ROM Right eval Left eval R 12/13/22  Hip flexion 110p! 130 120  Hip extension WNL WNL   Hip abduction WNL WNL   Hip adduction     Hip internal rotation -5P! WNL   Hip external rotation     Knee flexion     Knee extension     Ankle dorsiflexion     Ankle plantarflexion     Ankle inversion     Ankle eversion      (Blank rows = WFL)  LOWER EXTREMITY MMT:  MMT Right eval Left eval 12/13/22  Hip flexion P! 4/5  5/5 but some discomfort  Hip extension   Lock bridge wnl and symmetrical B  Hip abduction P! 3+/5  5/5  Hip adduction     Hip internal rotation     Hip external rotation     Knee flexion     Knee extension     Ankle dorsiflexion     Ankle plantarflexion     Ankle inversion     Ankle eversion      (Blank rows = WNL)  LOWER EXTREMITY SPECIAL TESTS:   Hip special tests: Luisa Hart (FABER) test: negative and Trendelenburg test: negative  FUNCTIONAL TESTS:  30 seconds chair stand test  GAIT: Distance walked: IN CLINIC no deficits noted Assistive device utilized: None Level of assistance: Complete Independence Comments: no specific deficits noted    TODAY'S TREATMENT:  DATE: 12/13/22 Therex: Re instructed/corrected R self mob for flexion ROM Reassessed strength and ROM R hip and went over verbally patient's current strengthening and stretching routine.   Adapted, changed prone hip extension with trunk on pillow, advanced to quadriped alt arm and leg lifts, cues to maintain level / stable lower back. Pt walking on TM, utilizing hip add and leg press for strength for LE's at gym  Patient performing modified planks off bed 60 sec Also supine tailor stretch for R lat hip  12/04/22:  Manual:  reassessed standing posture, pt using 1/8" lift in R shoe, mild R pelvic lat shift, R iliac crest slightly lower than L Supine for mob with movement, lat glides R femoral head, combined with flexion and IR  Cross friction massage:  very tender today R TFL , just superior to gr trochanter, with palpable tissue thickening and marked tenderness.   Trigger Point Dry-Needling  Treatment instructions: Expect mild to moderate muscle soreness. S/S of pneumothorax if dry needled over a lung field, and to seek immediate medical attention should they occur. Patient verbalized understanding of these instructions and education. Patient Consent Given: Yes Education handout provided: Previously provided Muscles treated: R TFL, 3 pts superior to Gr trochanter Treatment response/outcome: Twitch Response Elicited and Palpable Increase in Muscle Length  Instructed pt in self lateral glide R femoral head combined with R hip flexion lunge with R foot  propped on chair.    Iontophoresis: 80 mam with 0.04% dexamethasome over tender site superior to R gr trochanter R hip.    11/22/22: Manual:  supine for mobilization with movement, lateral glides R femoral head, combined with supine R hip flexion pain free, 15 reps 3 sets improved through session. Trigger Point Dry-Needling  Treatment instructions: Expect mild to moderate muscle soreness. S/S of pneumothorax if dry needled over a lung field, and to seek immediate medical attention should they occur. Patient verbalized understanding of these instructions and education. Patient Consent Given: Yes Education handout provided: No Muscles treated: R glut max, R piriformis, close to SI jt line 3 pts total total Parameters: N/A Treatment response/outcome: Twitch Response Elicited and Palpable Increase in Muscle Length   Deep cross friction massage R gluteals, in L side lying  after above techniques  Iontophoresis R lateral hip over gr trochanter, more posterior.  For 80 mA min, pt to leave intact for 3 hrs post today;s session.  11/20/22:  Manual:  supine for mobilization with movement, lateral glides R femoral head, combined with supine R hip flexion pain free, 15 reps 3 sets  Trigger Point Dry-Needling  Treatment instructions: Expect mild to moderate muscle soreness. S/S of pneumothorax if dry needled over a lung field, and to seek immediate medical attention should they occur. Patient verbalized understanding of these instructions and education. Patient Consent Given: Yes Education handout provided: No Muscles treated: R glut medius and R glut max , 3 pts total Parameters: N/A Treatment response/outcome: Twitch Response Elicited and Palpable Increase in Muscle Length   Deep cross friction massage R gluteals, in L side lying  after above techniques  Inst in prone hip extension, with slight abduction position to isolate R glut med, inst to place 2 pillows under abdomen and avoid hyperextension  of Rhip and thereby lumbar lordosis  11/16/22 NuStep L5 x 6 min Resisted gait 30lb 4 way x 3 each Leg press 30lb 2x12  R hip PROM in all directions with end range holds LE on Pball bridges, oblq, K2C Hooklying clams  green 2x12   11/14/22 NuStep L 5 x 6 min R hip PROM in all directions with end range holds Bridges 2x10 Advised K2C stretch in the morning LE on Pball bridges, oblq, K2C RLE SLR and Abd x5 some R hip twinging STM ti R GT area  Ionto R GT 1mL dex 4 hour patch  11/08/22: Evaluation, provided with 1/8" lift in R shoe to correct for limb length discrepancy, postural asymmetry. Advised to use at work and with working out, yard work. Improved posture with lift in place sitting and standing Reviewed home program patient has been performing as instructed by MD, advised to perform the bridging and tailor's stretch.  Advised to stop stretching R lateral hip and stop side lying hip abduction until inflammation reduced, able to perform side lying R hip abd without pain. Also advised to evaluate work surface as she states she has to rotate to R to view computer    PATIENT EDUCATION:  Education details: POC, goals Person educated: Patient Education method: Medical illustrator Education comprehension: verbalized understanding  HOME EXERCISE PROGRAM: Adjusted patient's current home program .    ASSESSMENT:  CLINICAL IMPRESSION: Patient is a 62 y.o female who was seen today by physical therapy due to chronic R lateral hip pain, and lower back pain. Lower back pain improved.  Non tender ab glutes, lower back, also strength now symmetrical and wnl B hips.  Still tender, has thickened mass just superior to R gr trochanter , that is very tender, perhaps scarring or prior tear.  We determined to dc PT today. She is to return to her referred MD next week. This PT wrote message to request that he look at R TFL .  OBJECTIVE IMPAIRMENTS: decreased ROM, decreased strength, increased  fascial restrictions, postural dysfunction, and pain.   ACTIVITY LIMITATIONS: carrying, sleeping, and driving  PARTICIPATION LIMITATIONS: driving, community activity, and occupation  PERSONAL FACTORS: Behavior pattern, Profession, and Time since onset of injury/illness/exacerbation are also affecting patient's functional outcome.   REHAB POTENTIAL: Good  CLINICAL DECISION MAKING: Stable/uncomplicated  EVALUATION COMPLEXITY: Low   GOALS: Goals reviewed with patient? Yes  SHORT TERM GOALS: Target date: 2 weeks, 11/22/22 I HEP Baseline: Goal status: INITIAL 12/13/22 met   LONG TERM GOALS: Target date: 01/03/23  Improve R hip abduction strength to 5/5 and pain free with resistance for improved gait, function Baseline: 4+ with pain Goal status: IN PROGRESS  10/31/22: pain free today  12/13/22: met  2.  Improve R hip ROM for flexion and IR to WNL for improved bed mobility, sitting tolerance Baseline: flexion 105, IR -5 Goal status: INITIAL 12/13/22: partially met  3.  Modified Oswestry 8/50 Baseline: 13/50 Goal status: INITIAL 12/13/22: met  4.  30 sec wit to stand 13 reps Baseline: assess in future visits Goal status: INITIAL Not assessed today    PLAN:  PT FREQUENCY: 1-2x/week  PT DURATION: 8 weeks  PLANNED INTERVENTIONS: Therapeutic exercises, Therapeutic activity, Neuromuscular re-education, Balance training, Gait training, Patient/Family education, Self Care, and Joint mobilization  PLAN FOR NEXT SESSION: how did lift work in R shoe, jt mobs R hip for flexion, possible dry needle R post hip, initiate isometrics or eccentrics R lat hip musculature   Jeraldin Fesler L Amarie Tarte, PT DPT [facility] 12/13/2022, 5:01 PM

## 2022-12-21 NOTE — Progress Notes (Signed)
[name] [address] [place] [address] Phone: [phone] Subjective:   Bruce Donath, am serving as a scribe for Dr. Antoine Primas.  I'm seeing this patient by the request  of:  Pincus Sanes, MD  CC: Right hip and low back pain follow-up  [id number]  10/27/2022 Do believe that some of the hip pain seems to be more secondary to the degenerative disc disease of the lumbar spine.  Decided to start with over-the-counter medications and formal physical therapy.  See how patient responds. Discussed with patient about icing regimen and home exercises, discussed which activities to do and which ones to avoid.  Discussed increasing activity follow-up with me again 6 weeks.      Update 12/22/2022 Emily Anderson is a 62 y.o. female coming in with complaint of R hip and LBP.  Patient may also had a greater trochanteric injection on the right side.  Patient has been doing formal PT. patient was discharged from physical therapy August 14.  Patient states that she is doing somewhat better but is not where she wants to be. PT was helpful. Back will flare if she does too much.    Patient did have x-rays taken in May of this year.  These were independently visualized by me showing the patient does have stable scoliosis of the lumbar spine degenerative disc disease in the hip exam is unremarkable.    Past Medical History:  Diagnosis Date   Allergy    Dyspepsia    food triggers   GERD (gastroesophageal reflux disease)    Heart murmur    Hyperlipidemia    Hypertension    Hypothyroidism    Past Surgical History:  Procedure Laterality Date   ABLATION     CESAREAN SECTION     G 2 P 2, both C sections   COLONOSCOPY  2009   negative; [facility]   LAPAROSCOPIC VAGINAL HYSTERECTOMY WITH SALPINGECTOMY Bilateral 08/02/2015   Procedure: LAPAROSCOPIC ASSISTED VAGINAL HYSTERECTOMY WITH SALPINGECTOMY;  Surgeon: Huel Cote, MD;  Location: [facility];   Service: Gynecology;  Laterality: Bilateral;   Social History   Socioeconomic History   Marital status: Married    Spouse name: Not on file   Number of children: Not on file   Years of education: Not on file   Highest education level: Not on file  Occupational History   Occupation: MANAGER    Employer: [employer]  Tobacco Use   Smoking status: Never   Smokeless tobacco: Never  Substance and Sexual Activity   Alcohol use: No   Drug use: No   Sexual activity: Yes  Other Topics Concern   Not on file  Social History Narrative   REG EXERCISE..   LOW CARB LOW NA DIET   Social Determinants of Health   Financial Resource Strain: Not on file  Food Insecurity: No Food Insecurity (01/16/2022)   Hunger Vital Sign    Worried About Running Out of Food in the Last Year: Never true    Ran Out of Food in the Last Year: Never true  Transportation Needs: Not on file  Physical Activity: Not on file  Stress: Not on file  Social Connections: Not on file   Allergies  Allergen Reactions   Amoxicillin Rash    Rash Because of a history of documented adverse serious drug reaction;Medi Alert bracelet  is recommended Has patient had a PCN reaction causing immediate rash, facial/tongue/throat swelling, SOB or lightheadedness  with hypotension: No Has patient had a PCN reaction causing severe rash involving mucus membranes or skin necrosis: No Has patient had a PCN reaction that required hospitalization No Has patient had a PCN reaction occurring within the last 10 years: No If all of the above answers are "N Rash Because of a history of documented adverse serious drug reaction;Medi Alert bracelet  is recommended Has patient had a PCN reaction causing immediate rash, facial/tongue/throat swelling, SOB or lightheadedness with hypotension: No Has patient had a PCN reaction causing severe rash involving mucus membranes or skin necrosis: No Has patient had a PCN reaction that  required hospitalization No Has patient had a PCN reaction occurring within the last 10 years: No If all of the above answers are "N   Hctz [Hydrochlorothiazide] Other (See Comments)    03/2012 K+ 3.3 03/2012 K+ 3.3   Sulfamethoxazole-Trimethoprim Rash    H/a's, heart racing & flu symptoms   Family History  Problem Relation Age of Onset   Thyroid cancer Mother    Prostate cancer Father    Heart disease Father 77       angioplasty    Colon polyps Father    Diabetes Father    Other Father 32       carcinoid tumor    Colon cancer Father    Diabetes Paternal Aunt        1   Diabetes Paternal Uncle         2   Colon polyps Sister    Muscular dystrophy Sister    Stroke Neg Hx    Esophageal cancer Neg Hx    Liver cancer Neg Hx    Pancreatic cancer Neg Hx    Rectal cancer Neg Hx    Stomach cancer Neg Hx     Current Outpatient Medications (Endocrine & Metabolic):    levothyroxine (SYNTHROID) 50 MCG tablet, TAKE 2 TABLETS BY MOUTH EVERY SATURDAY AND SUNDAY, THEN 1 TABLET ON MONDAY THROUGH FRIDAY   predniSONE (DELTASONE) 20 MG tablet, Take 2 tablets (40 mg total) by mouth daily with breakfast.   Current Outpatient Medications (Respiratory):    cetirizine (ZYRTEC) 10 MG tablet, Take 10 mg by mouth daily.  Current Outpatient Medications (Analgesics):    aspirin EC 81 MG tablet, Take 81 mg by mouth daily.   Current Outpatient Medications (Other):    Biotin 5000 MCG TABS, Take 10,000 mcg/day by mouth daily.   cholecalciferol (VITAMIN D) 25 MCG (1000 UNIT) tablet, 1 tablet   Multiple Vitamins-Minerals (CENTRUM SILVER PO), Take 1 tablet by mouth daily.   Omega-3 Fatty Acids (FISH OIL PO), Take 1,200 mg by mouth daily.   OVER THE COUNTER MEDICATION, Tumeric Curcumin 500 mg. One capsule daily.   polyethylene glycol powder (MIRALAX) 17 GM/SCOOP powder, Take 17 g by mouth daily.   tiZANidine (ZANAFLEX) 2 MG tablet, Take 1 tablet (2 mg total) by mouth at bedtime.   valACYclovir  (VALTREX) 1000 MG tablet, Take 2,000 mg by mouth 2 (two) times daily as needed.   Reviewed prior external information including notes and imaging from  primary care provider As well as notes that were available from care everywhere and other healthcare systems.  Past medical history, social, surgical and family history all reviewed in electronic medical record.  No pertanent information unless stated regarding to the chief complaint.   Review of Systems:  No headache, visual changes, nausea, vomiting, diarrhea, constipation, dizziness, abdominal pain, skin rash, fevers, chills, night sweats, weight loss, swollen  lymph nodes, body aches, joint swelling, chest pain, shortness of breath, mood changes. POSITIVE muscle aches  Objective  Blood pressure 104/72, pulse (!) 58, height 5\' 8"  (1.727 m), last menstrual period 07/19/2015, SpO2 97%.   General: No apparent distress alert and oriented x3 mood and affect normal, dressed appropriately.  HEENT: Pupils equal, extraocular movements intact  Respiratory: Patient's speak in full sentences and does not appear short of breath  Cardiovascular: No lower extremity edema, non tender, no erythema  Low back pain exam shows lordosis.  Tightness with straight leg test with some radicular symptoms noted.  Tender to palpation in the gluteal area on the right side.  Less pain with the FABER test.  Left hip exam shows nontender on exam today.    After verbal consent patient was prepped with alcohol swab and with a 21-gauge 2 inch needle injected into the right gluteal tendon sheath distally area with 2 cc of 0.5% Marcaine and 1 cc of Kenalog 40 mg/mL.  No blood loss.  Band-Aid placed.  Postinjection instructions given   Impression and Recommendations:    The above documentation has been reviewed and is accurate and complete Judi Saa, DO

## 2022-12-22 ENCOUNTER — Encounter: Payer: Self-pay | Admitting: Family Medicine

## 2022-12-22 ENCOUNTER — Ambulatory Visit: Payer: 59 | Admitting: Family Medicine

## 2022-12-22 VITALS — BP 104/72 | HR 58 | Ht 68.0 in

## 2022-12-22 DIAGNOSIS — M7601 Gluteal tendinitis, right hip: Secondary | ICD-10-CM | POA: Diagnosis not present

## 2022-12-22 DIAGNOSIS — M5136 Other intervertebral disc degeneration, lumbar region: Secondary | ICD-10-CM

## 2022-12-22 MED ORDER — TIZANIDINE HCL 2 MG PO TABS: 2.0000 mg | ORAL_TABLET | Freq: Every day | ORAL | 0 refills | Status: DC

## 2022-12-22 MED ORDER — PREDNISONE 20 MG PO TABS: 40.0000 mg | ORAL_TABLET | Freq: Every day | ORAL | 0 refills | Status: DC

## 2022-12-22 NOTE — Assessment & Plan Note (Signed)
Injection given today and tolerated the procedure well, discussed icing regimen and home exercises, discussed which activities to do and which ones to avoid.  Increase activity slowly.  Discussed icing regimen and home exercises.  Getting 6 to 8 weeks

## 2022-12-22 NOTE — Assessment & Plan Note (Addendum)
Still believe that some of the moderate arthritic changes that we have noticed previously on x-ray could be potentially contributing.  Patient has had known this for over 5 years at this point.  Could be having radicular symptoms as well as causing it to be more of a weakness.  Given injection again today to see if this would be beneficial localized in the hip but do feel that at this point with patient failing formal physical therapy, oral anti-inflammatories, icing regimen as well as injections that it is worth advanced imaging.  Patient is having pain with daily activities and waking her up at night.  Depending on findings patient could be a candidate for possible epidurals or facet injections if necessary.  Patient will follow-up after MRI to discuss further.  Prednisone and Zanaflex also prescribed.

## 2022-12-22 NOTE — Patient Instructions (Addendum)
Injection in GT today Prescriptions filled See you again in 6 weeks

## 2022-12-26 ENCOUNTER — Encounter: Payer: Self-pay | Admitting: Family Medicine

## 2022-12-26 ENCOUNTER — Other Ambulatory Visit: Payer: Self-pay

## 2022-12-26 DIAGNOSIS — M545 Low back pain, unspecified: Secondary | ICD-10-CM

## 2023-01-04 ENCOUNTER — Ambulatory Visit
Admission: RE | Admit: 2023-01-04 | Discharge: 2023-01-04 | Disposition: A | Discharge status: Home / Self Care | Payer: 59 | Source: Ambulatory Visit | Attending: Family Medicine | Admitting: Family Medicine

## 2023-01-04 DIAGNOSIS — M545 Low back pain, unspecified: Secondary | ICD-10-CM

## 2023-02-01 NOTE — Progress Notes (Signed)
[facility] [address] [place] [address] Phone: [phone] Subjective:   Emily Anderson, am serving as a scribe for Dr. Antoine Primas.  I'm seeing this patient by the request  of:  Pincus Sanes, MD  CC: Low back pain  [id number]  12/22/2022 Injection given today and tolerated the procedure well, discussed icing regimen and home exercises, discussed which activities to do and which ones to avoid.  Increase activity slowly.  Discussed icing regimen and home exercises.  Getting 6 to 8 weeks      Update 02/02/2023 Emily Anderson is a 62 y.o. female coming in with complaint of lumbar spine pain and R hip pain. Patient states hip is feeling good. Here to discuss MRI results.  Found to have severe spinal stenosis at L4-L5.  Advanced degenerative disc disease throughout the back     Past Medical History:  Diagnosis Date   Allergy    Dyspepsia    food triggers   GERD (gastroesophageal reflux disease)    Heart murmur    Hyperlipidemia    Hypertension    Hypothyroidism    Past Surgical History:  Procedure Laterality Date   ABLATION     CESAREAN SECTION     G 2 P 2, both C sections   COLONOSCOPY  2009   negative; [facility]   LAPAROSCOPIC VAGINAL HYSTERECTOMY WITH SALPINGECTOMY Bilateral 08/02/2015   Procedure: LAPAROSCOPIC ASSISTED VAGINAL HYSTERECTOMY WITH SALPINGECTOMY;  Surgeon: Huel Cote, MD;  Location: [facility];  Service: Gynecology;  Laterality: Bilateral;   Social History   Socioeconomic History   Marital status: Married    Spouse name: Not on file   Number of children: Not on file   Years of education: Not on file   Highest education level: Not on file  Occupational History   Occupation: MANAGER    Employer: [employer]  Tobacco Use   Smoking status: Never   Smokeless tobacco: Never  Substance and Sexual Activity   Alcohol use: No   Drug use: No   Sexual activity: Yes  Other Topics  Concern   Not on file  Social History Narrative   REG EXERCISE..   LOW CARB LOW NA DIET   Social Determinants of Health   Financial Resource Strain: Not on file  Food Insecurity: No Food Insecurity (01/16/2022)   Hunger Vital Sign    Worried About Running Out of Food in the Last Year: Never true    Ran Out of Food in the Last Year: Never true  Transportation Needs: Not on file  Physical Activity: Not on file  Stress: Not on file  Social Connections: Not on file   Allergies  Allergen Reactions   Amoxicillin Rash    Rash Because of a history of documented adverse serious drug reaction;Medi Alert bracelet  is recommended Has patient had a PCN reaction causing immediate rash, facial/tongue/throat swelling, SOB or lightheadedness with hypotension: No Has patient had a PCN reaction causing severe rash involving mucus membranes or skin necrosis: No Has patient had a PCN reaction that required hospitalization No Has patient had a PCN reaction occurring within the last 10 years: No If all of the above answers are "N Rash Because of a history of documented adverse serious drug reaction;Medi Alert bracelet  is recommended Has patient had a PCN reaction causing immediate rash, facial/tongue/throat swelling, SOB or lightheadedness with hypotension: No Has patient had a PCN reaction causing severe rash involving mucus  membranes or skin necrosis: No Has patient had a PCN reaction that required hospitalization No Has patient had a PCN reaction occurring within the last 10 years: No If all of the above answers are "N   Hctz [Hydrochlorothiazide] Other (See Comments)    03/2012 K+ 3.3 03/2012 K+ 3.3   Sulfamethoxazole-Trimethoprim Rash    H/a's, heart racing & flu symptoms   Family History  Problem Relation Age of Onset   Thyroid cancer Mother    Prostate cancer Father    Heart disease Father 67       angioplasty    Colon polyps Father    Diabetes Father    Other Father 78        carcinoid tumor    Colon cancer Father    Diabetes Paternal Aunt        1   Diabetes Paternal Uncle         2   Colon polyps Sister    Muscular dystrophy Sister    Stroke Neg Hx    Esophageal cancer Neg Hx    Liver cancer Neg Hx    Pancreatic cancer Neg Hx    Rectal cancer Neg Hx    Stomach cancer Neg Hx     Current Outpatient Medications (Endocrine & Metabolic):    levothyroxine (SYNTHROID) 50 MCG tablet, TAKE 2 TABLETS BY MOUTH EVERY SATURDAY AND SUNDAY, THEN 1 TABLET ON MONDAY THROUGH FRIDAY   predniSONE (DELTASONE) 20 MG tablet, Take 2 tablets (40 mg total) by mouth daily with breakfast.   Current Outpatient Medications (Respiratory):    cetirizine (ZYRTEC) 10 MG tablet, Take 10 mg by mouth daily.  Current Outpatient Medications (Analgesics):    aspirin EC 81 MG tablet, Take 81 mg by mouth daily.   Current Outpatient Medications (Other):    Biotin 5000 MCG TABS, Take 10,000 mcg/day by mouth daily.   cholecalciferol (VITAMIN D) 25 MCG (1000 UNIT) tablet, 1 tablet   Multiple Vitamins-Minerals (CENTRUM SILVER PO), Take 1 tablet by mouth daily.   Omega-3 Fatty Acids (FISH OIL PO), Take 1,200 mg by mouth daily.   OVER THE COUNTER MEDICATION, Tumeric Curcumin 500 mg. One capsule daily.   polyethylene glycol powder (MIRALAX) 17 GM/SCOOP powder, Take 17 g by mouth daily.   tiZANidine (ZANAFLEX) 2 MG tablet, Take 1 tablet (2 mg total) by mouth at bedtime.   valACYclovir (VALTREX) 1000 MG tablet, Take 2,000 mg by mouth 2 (two) times daily as needed.   Reviewed prior external information including notes and imaging from  primary care provider As well as notes that were available from care everywhere and other healthcare systems.  Past medical history, social, surgical and family history all reviewed in electronic medical record.  No pertanent information unless stated regarding to the chief complaint.   Review of Systems:  No headache, visual changes, nausea, vomiting,  diarrhea, constipation, dizziness, abdominal pain, skin rash, fevers, chills, night sweats, weight loss, swollen lymph nodes, body aches, joint swelling, chest pain, shortness of breath, mood changes. POSITIVE muscle aches  Objective  Blood pressure 116/74, pulse 63, height 5\' 8"  (1.727 m), weight 154 lb (69.9 kg), last menstrual period 07/19/2015, SpO2 96%.   General: No apparent distress alert and oriented x3 mood and affect normal, dressed appropriately.  HEENT: Pupils equal, extraocular movements intact  Respiratory: Patient's speak in full sentences and does not appear short of breath  Cardiovascular: No lower extremity edema, non tender, no erythema  Is noted.  Does have some  mild atrophy of the gluteal musculature.  Tightness with straight leg test but no true radicular symptoms.  Able to get out of a seated position without using her arms.    Impression and Recommendations:    The above documentation has been reviewed and is accurate and complete Judi Saa, DO

## 2023-02-02 ENCOUNTER — Encounter: Payer: Self-pay | Admitting: Family Medicine

## 2023-02-02 ENCOUNTER — Ambulatory Visit: Payer: 59 | Admitting: Family Medicine

## 2023-02-02 VITALS — BP 116/74 | HR 63 | Ht 68.0 in | Wt 154.0 lb

## 2023-02-02 DIAGNOSIS — M51362 Other intervertebral disc degeneration, lumbar region with discogenic back pain and lower extremity pain: Secondary | ICD-10-CM | POA: Diagnosis not present

## 2023-02-02 NOTE — Assessment & Plan Note (Signed)
Discussed with patient that MRI does show that she does have severe spinal stenosis mostly at the L4-L5 area.  We discussed how this can contribute to the discomfort and pain that she is having.  Family history of muscular dystrophy and I did not see the MRI showing anything early signs of that.  At this point patient would like to continue with conservative therapy.  Will avoid certain things such as repetitive extension of the back.  Discussed potential epidurals that I think will be beneficial.  Patient will follow-up with me again in 3 months.  Total time reviewing patient's MRI and discussing with patient and going over the pathology 32 minutes

## 2023-02-02 NOTE — Patient Instructions (Signed)
L4/L5 severe spinal stenosis Avoid repetitive extension of back Think of doing abs on the bench Really recommend adjustable standing desk You know where we are if you need [place]

## 2023-07-27 ENCOUNTER — Encounter: Payer: 59 | Admitting: Internal Medicine

## 2023-07-30 NOTE — Patient Instructions (Addendum)
 Tetanus vaccine given.    Blood work was ordered.       Medications changes include :   None     Return in about 1 year (around 07/30/2024) for Physical Exam.   Health Maintenance, Female Adopting a healthy lifestyle and getting preventive care are important in promoting health and wellness. Ask your health care provider about: The right schedule for you to have regular tests and exams. Things you can do on your own to prevent diseases and keep yourself healthy. What should I know about diet, weight, and exercise? Eat a healthy diet  Eat a diet that includes plenty of vegetables, fruits, low-fat dairy products, and lean protein. Do not eat a lot of foods that are high in solid fats, added sugars, or sodium. Maintain a healthy weight Body mass index (BMI) is used to identify weight problems. It estimates body fat based on height and weight. Your health care provider can help determine your BMI and help you achieve or maintain a healthy weight. Get regular exercise Get regular exercise. This is one of the most important things you can do for your health. Most adults should: Exercise for at least 150 minutes each week. The exercise should increase your heart rate and make you sweat (moderate-intensity exercise). Do strengthening exercises at least twice a week. This is in addition to the moderate-intensity exercise. Spend less time sitting. Even light physical activity can be beneficial. Watch cholesterol and blood lipids Have your blood tested for lipids and cholesterol at 63 years of age, then have this test every 5 years. Have your cholesterol levels checked more often if: Your lipid or cholesterol levels are high. You are older than 63 years of age. You are at high risk for heart disease. What should I know about cancer screening? Depending on your health history and family history, you may need to have cancer screening at various ages. This may include screening  for: Breast cancer. Cervical cancer. Colorectal cancer. Skin cancer. Lung cancer. What should I know about heart disease, diabetes, and high blood pressure? Blood pressure and heart disease High blood pressure causes heart disease and increases the risk of stroke. This is more likely to develop in people who have high blood pressure readings or are overweight. Have your blood pressure checked: Every 3-5 years if you are 86-39 years of age. Every year if you are 81 years old or older. Diabetes Have regular diabetes screenings. This checks your fasting blood sugar level. Have the screening done: Once every three years after age 68 if you are at a normal weight and have a low risk for diabetes. More often and at a younger age if you are overweight or have a high risk for diabetes. What should I know about preventing infection? Hepatitis B If you have a higher risk for hepatitis B, you should be screened for this virus. Talk with your health care provider to find out if you are at risk for hepatitis B infection. Hepatitis C Testing is recommended for: Everyone born from [birth year] through 1965. Anyone with known risk factors for hepatitis C. Sexually transmitted infections (STIs) Get screened for STIs, including gonorrhea and chlamydia, if: You are sexually active and are younger than 63 years of age. You are older than 63 years of age and your health care provider tells you that you are at risk for this type of infection. Your sexual activity has changed since you were last screened, and you are at increased  risk for chlamydia or gonorrhea. Ask your health care provider if you are at risk. Ask your health care provider about whether you are at high risk for HIV. Your health care provider may recommend a prescription medicine to help prevent HIV infection. If you choose to take medicine to prevent HIV, you should first get tested for HIV. You should then be tested every 3 months for as long as you  are taking the medicine. Pregnancy If you are about to stop having your period (premenopausal) and you may become pregnant, seek counseling before you get pregnant. Take 400 to 800 micrograms (mcg) of folic acid every day if you become pregnant. Ask for birth control (contraception) if you want to prevent pregnancy. Osteoporosis and menopause Osteoporosis is a disease in which the bones lose minerals and strength with aging. This can result in bone fractures. If you are 16 years old or older, or if you are at risk for osteoporosis and fractures, ask your health care provider if you should: Be screened for bone loss. Take a calcium or vitamin D supplement to lower your risk of fractures. Be given hormone replacement therapy (HRT) to treat symptoms of menopause. Follow these instructions at home: Alcohol use Do not drink alcohol if: Your health care provider tells you not to drink. You are pregnant, may be pregnant, or are planning to become pregnant. If you drink alcohol: Limit how much you have to: 0-1 drink a day. Know how much alcohol is in your drink. In the [place] one drink equals one 12 oz bottle of beer (355 mL), one 5 oz glass of wine (148 mL), or one 1 oz glass of hard liquor (44 mL). Lifestyle Do not use any products that contain nicotine or tobacco. These products include cigarettes, chewing tobacco, and vaping devices, such as e-cigarettes. If you need help quitting, ask your health care provider. Do not use street drugs. Do not share needles. Ask your health care provider for help if you need support or information about quitting drugs. General instructions Schedule regular health, dental, and eye exams. Stay current with your vaccines. Tell your health care provider if: You often feel depressed. You have ever been abused or do not feel safe at home. Summary Adopting a healthy lifestyle and getting preventive care are important in promoting health and wellness. Follow your  health care provider's instructions about healthy diet, exercising, and getting tested or screened for diseases. Follow your health care provider's instructions on monitoring your cholesterol and blood pressure. This information is not intended to replace advice given to you by your health care provider. Make sure you discuss any questions you have with your health care provider. Document Revised: 09/06/2020 Document Reviewed: 09/06/2020 Elsevier Patient Education  2024 [employer].

## 2023-07-30 NOTE — Progress Notes (Unsigned)
 Subjective:    Patient ID: Emily Anderson, female    DOB: 02-13-1961, 63 y.o.   MRN: [id number]      HPI Torrey is here for a Physical exam and her chronic medical problems.   Overall doing well - feels better than last year.    Medications and allergies reviewed with patient and updated if appropriate.  Current Outpatient Medications on File Prior to Visit  Medication Sig Dispense Refill   aspirin EC 81 MG tablet Take 81 mg by mouth daily.     cetirizine (ZYRTEC) 10 MG tablet Take 10 mg by mouth daily.     cholecalciferol (VITAMIN D) 25 MCG (1000 UNIT) tablet 1 tablet     levothyroxine (SYNTHROID) 50 MCG tablet TAKE 2 TABLETS BY MOUTH EVERY SATURDAY AND SUNDAY, THEN 1 TABLET ON MONDAY THROUGH FRIDAY 116 tablet 3   Multiple Vitamins-Minerals (CENTRUM SILVER PO) Take 1 tablet by mouth daily.     Omega-3 Fatty Acids (FISH OIL PO) Take 1,200 mg by mouth daily.     OVER THE COUNTER MEDICATION Tumeric Curcumin 500 mg. One capsule daily.     polyethylene glycol powder (MIRALAX) 17 GM/SCOOP powder Take 17 g by mouth daily. 17 g 0   valACYclovir (VALTREX) 1000 MG tablet Take 2,000 mg by mouth 2 (two) times daily as needed.     No current facility-administered medications on file prior to visit.    Review of Systems  Constitutional:  Negative for fever.  Eyes:  Negative for visual disturbance.  Respiratory:  Negative for cough, shortness of breath and wheezing.   Cardiovascular:  Negative for chest pain, palpitations and leg swelling.  Gastrointestinal:  Positive for constipation. Negative for abdominal pain, blood in stool and diarrhea.       No gerd  Genitourinary:  Negative for dysuria.  Musculoskeletal:  Positive for back pain. Negative for arthralgias.  Skin:  Negative for rash.  Neurological:  Positive for numbness (right bunion). Negative for light-headedness and headaches.  Psychiatric/Behavioral:  Negative for dysphoric mood. The patient is not nervous/anxious.         Objective:   Vitals:   07/31/23 0759  BP: 110/76  Pulse: (!) 57  Temp: 98 F (36.7 C)  SpO2: 97%   Filed Weights   07/31/23 0759  Weight: 153 lb (69.4 kg)   Body mass index is 23.26 kg/m.  BP Readings from Last 3 Encounters:  07/31/23 110/76  02/02/23 116/74  12/22/22 104/72    Wt Readings from Last 3 Encounters:  07/31/23 153 lb (69.4 kg)  02/02/23 154 lb (69.9 kg)  10/27/22 155 lb (70.3 kg)       Physical Exam Constitutional: She appears well-developed and well-nourished. No distress.  HENT:  Head: Normocephalic and atraumatic.  Right Ear: External ear normal. Normal ear canal and TM Left Ear: External ear normal.  Normal ear canal and TM Mouth/Throat: Oropharynx is clear and moist.  Eyes: Conjunctivae normal.  Neck: Neck supple. No tracheal deviation present. No thyromegaly present.  No carotid bruit  Cardiovascular: Normal rate, regular rhythm and normal heart sounds.   No murmur heard.  No edema. Pulmonary/Chest: Effort normal and breath sounds normal. No respiratory distress. She has no wheezes. She has no rales.  Breast: deferred   Abdominal: Soft. She exhibits no distension. There is no tenderness.  Lymphadenopathy: She has no cervical adenopathy.  Skin: Skin is warm and dry. She is not diaphoretic.  Psychiatric: She has a normal mood and  affect. Her behavior is normal.     Lab Results  Component Value Date   WBC 5.9 07/21/2022   HGB 13.9 07/21/2022   HCT 40.9 07/21/2022   PLT 213.0 07/21/2022   GLUCOSE 98 07/21/2022   CHOL 230 (H) 07/21/2022   TRIG 69.0 07/21/2022   HDL 72.50 07/21/2022   LDLDIRECT 171.0 08/23/2015   LDLCALC 144 (H) 07/21/2022   ALT 24 07/21/2022   AST 22 07/21/2022   NA 143 07/21/2022   K 4.2 07/21/2022   CL 105 07/21/2022   CREATININE 0.76 07/21/2022   BUN 17 07/21/2022   CO2 30 07/21/2022   TSH 2.98 07/21/2022   HGBA1C 5.6 07/21/2022     The 10-year ASCVD risk score (Arnett DK, et al., 2019) is: 2.8%    Values used to calculate the score:     Age: 16 years     Sex: Female     Is Non-Hispanic African American: No     Diabetic: No     Tobacco smoker: No     Systolic Blood Pressure: 110 mmHg     Is BP treated: No     HDL Cholesterol: 72.5 mg/dL     Total Cholesterol: 230 mg/dL     Assessment & Plan:   Physical exam: Screening blood work  ordered Exercise  gym 3/week.  Stretches daily Weight  normal Substance abuse  none   Reviewed recommended immunizations.   Health Maintenance  Topic Date Due   DTaP/Tdap/Td (2 - Td or Tdap) 05/15/2023   MAMMOGRAM  09/29/2023   INFLUENZA VACCINE  11/30/2023   Colonoscopy  09/01/2027   Hepatitis C Screening  Completed   HIV Screening  Completed   Zoster Vaccines- Shingrix  Completed   HPV VACCINES  Aged Out   COVID-19 Vaccine  Discontinued          See Problem List for Assessment and Plan of chronic medical problems.

## 2023-07-31 ENCOUNTER — Ambulatory Visit (INDEPENDENT_AMBULATORY_CARE_PROVIDER_SITE_OTHER): Payer: 59 | Admitting: Internal Medicine

## 2023-07-31 VITALS — BP 110/76 | HR 57 | Temp 98.0°F | Ht 68.0 in | Wt 153.0 lb

## 2023-07-31 DIAGNOSIS — E7800 Pure hypercholesterolemia, unspecified: Secondary | ICD-10-CM

## 2023-07-31 DIAGNOSIS — I10 Essential (primary) hypertension: Secondary | ICD-10-CM

## 2023-07-31 DIAGNOSIS — M21611 Bunion of right foot: Secondary | ICD-10-CM

## 2023-07-31 DIAGNOSIS — E063 Autoimmune thyroiditis: Secondary | ICD-10-CM | POA: Diagnosis not present

## 2023-07-31 DIAGNOSIS — Z23 Encounter for immunization: Secondary | ICD-10-CM

## 2023-07-31 DIAGNOSIS — R7303 Prediabetes: Secondary | ICD-10-CM

## 2023-07-31 DIAGNOSIS — M48061 Spinal stenosis, lumbar region without neurogenic claudication: Secondary | ICD-10-CM | POA: Insufficient documentation

## 2023-07-31 DIAGNOSIS — Z0000 Encounter for general adult medical examination without abnormal findings: Secondary | ICD-10-CM

## 2023-07-31 LAB — LIPID PANEL
Cholesterol: 202 mg/dL — ABNORMAL HIGH (ref 0–200)
HDL: 51.3 mg/dL (ref >39.00)
LDL Cholesterol: 128 mg/dL — ABNORMAL HIGH (ref 0–99)
NonHDL: 150.49
Total CHOL/HDL Ratio: 4
Triglycerides: 112 mg/dL (ref 0.0–149.0)
VLDL: 22.4 mg/dL (ref 0.0–40.0)

## 2023-07-31 LAB — CBC WITH DIFFERENTIAL/PLATELET
Basophils Absolute: 0 10*3/uL (ref 0.0–0.1)
Basophils Relative: 0.7 % (ref 0.0–3.0)
Eosinophils Absolute: 0.2 10*3/uL (ref 0.0–0.7)
Eosinophils Relative: 4.4 % (ref 0.0–5.0)
HCT: 41.2 % (ref 36.0–46.0)
Hemoglobin: 13.9 g/dL (ref 12.0–15.0)
Lymphocytes Relative: 33.7 % (ref 12.0–46.0)
Lymphs Abs: 1.8 10*3/uL (ref 0.7–4.0)
MCHC: 33.7 g/dL (ref 30.0–36.0)
MCV: 94.1 fl (ref 78.0–100.0)
Monocytes Absolute: 0.5 10*3/uL (ref 0.1–1.0)
Monocytes Relative: 9.7 % (ref 3.0–12.0)
Neutro Abs: 2.7 10*3/uL (ref 1.4–7.7)
Neutrophils Relative %: 51.5 % (ref 43.0–77.0)
Platelets: 208 10*3/uL (ref 150.0–400.0)
RBC: 4.38 Mil/uL (ref 3.87–5.11)
RDW: 13 % (ref 11.5–15.5)
WBC: 5.2 10*3/uL (ref 4.0–10.5)

## 2023-07-31 LAB — COMPREHENSIVE METABOLIC PANEL WITH GFR
ALT: 29 U/L (ref 0–35)
AST: 22 U/L (ref 0–37)
Albumin: 4.3 g/dL (ref 3.5–5.2)
Alkaline Phosphatase: 67 U/L (ref 39–117)
BUN: 16 mg/dL (ref 6–23)
CO2: 30 meq/L (ref 19–32)
Calcium: 9.5 mg/dL (ref 8.4–10.5)
Chloride: 104 meq/L (ref 96–112)
Creatinine, Ser: 0.7 mg/dL (ref 0.40–1.20)
GFR: 92.78 mL/min (ref >60.00)
Glucose, Bld: 95 mg/dL (ref 70–99)
Potassium: 4.1 meq/L (ref 3.5–5.1)
Sodium: 141 meq/L (ref 135–145)
Total Bilirubin: 0.5 mg/dL (ref 0.2–1.2)
Total Protein: 7.4 g/dL (ref 6.0–8.3)

## 2023-07-31 LAB — TSH: TSH: 2.25 u[IU]/mL (ref 0.35–5.50)

## 2023-07-31 LAB — HEMOGLOBIN A1C: Hgb A1c MFr Bld: 5.7 % (ref 4.6–6.5)

## 2023-07-31 NOTE — Assessment & Plan Note (Signed)
 Chronic Family history of diabetes Lab Results  Component Value Date   HGBA1C 5.6 07/21/2022   Check a1c Low sugar / carb diet Stressed regular exercise

## 2023-07-31 NOTE — Assessment & Plan Note (Signed)
Chronic  ?Clinically euthyroid ?Currently taking levothyroxine 50 mcg 5 days a week, 100 mcg 2 days a week ?Check tsh  ?Titrate med dose if needed ?

## 2023-07-31 NOTE — Assessment & Plan Note (Signed)
 Chronic Was on medication up until last year when she stopped the spironolactone and her blood pressure remained well-controlled Blood pressure well controlled CMP, CBC Continue lifestyle control Recommended monitoring blood pressure at home

## 2023-07-31 NOTE — Assessment & Plan Note (Signed)
 Chronic Regular exercise and healthy diet encouraged Check lipid panel, CMP Lifestyle controlled - low ascvd risk

## 2023-08-01 ENCOUNTER — Encounter: Payer: Self-pay | Admitting: Internal Medicine

## 2023-08-05 ENCOUNTER — Other Ambulatory Visit: Payer: Self-pay | Admitting: Internal Medicine

## 2023-08-06 ENCOUNTER — Ambulatory Visit (INDEPENDENT_AMBULATORY_CARE_PROVIDER_SITE_OTHER)

## 2023-08-06 ENCOUNTER — Ambulatory Visit: Admitting: Podiatry

## 2023-08-06 DIAGNOSIS — M792 Neuralgia and neuritis, unspecified: Secondary | ICD-10-CM | POA: Diagnosis not present

## 2023-08-06 DIAGNOSIS — M21619 Bunion of unspecified foot: Secondary | ICD-10-CM

## 2023-08-06 DIAGNOSIS — M25571 Pain in right ankle and joints of right foot: Secondary | ICD-10-CM

## 2023-08-06 DIAGNOSIS — M21611 Bunion of right foot: Secondary | ICD-10-CM

## 2023-08-06 NOTE — Progress Notes (Unsigned)
 Chief Complaint  Patient presents with   Bunions    Right foot bunion  Pt stated she would like to talk about her options    HPI: 63 y.o. female presents today for bunion evaluation.  She notes some neuralgia type of symptoms to the right bunion area.  Denies any significant joint pain or grinding within the joint when walking.  She is trying to hold off on having any surgery performed until she retires.  Past Medical History:  Diagnosis Date   Allergy    Dyspepsia    food triggers   GERD (gastroesophageal reflux disease)    Heart murmur    Hyperlipidemia    Hypertension    Hypothyroidism     Past Surgical History:  Procedure Laterality Date   ABLATION     CESAREAN SECTION     G 2 P 2, both C sections   COLONOSCOPY  2009   negative; [facility]   LAPAROSCOPIC VAGINAL HYSTERECTOMY WITH SALPINGECTOMY Bilateral 08/02/2015   Procedure: LAPAROSCOPIC ASSISTED VAGINAL HYSTERECTOMY WITH SALPINGECTOMY;  Surgeon: Huel Cote, MD;  Location: [facility];  Service: Gynecology;  Laterality: Bilateral;    Allergies  Allergen Reactions   Amoxicillin Rash    Rash Because of a history of documented adverse serious drug reaction;Medi Alert bracelet  is recommended Has patient had a PCN reaction causing immediate rash, facial/tongue/throat swelling, SOB or lightheadedness with hypotension: No Has patient had a PCN reaction causing severe rash involving mucus membranes or skin necrosis: No Has patient had a PCN reaction that required hospitalization No Has patient had a PCN reaction occurring within the last 10 years: No If all of the above answers are "N Rash Because of a history of documented adverse serious drug reaction;Medi Alert bracelet  is recommended Has patient had a PCN reaction causing immediate rash, facial/tongue/throat swelling, SOB or lightheadedness with hypotension: No Has patient had a PCN reaction causing severe rash involving mucus membranes or skin necrosis: No Has  patient had a PCN reaction that required hospitalization No Has patient had a PCN reaction occurring within the last 10 years: No If all of the above answers are "N   Hctz [Hydrochlorothiazide] Other (See Comments)    03/2012 K+ 3.3 03/2012 K+ 3.3   Sulfamethoxazole-Trimethoprim Rash    H/a's, heart racing & flu symptoms    PHYSICAL EXAM: General: The patient is alert and oriented x3 in no acute distress.  Dermatology: Skin is warm, dry and supple bilateral lower extremities. Interspaces are clear of maceration and debris.  No rashes noted.   Vascular: Palpable pedal pulses bilaterally. Capillary refill within normal limits.  No appreciable edema.  No erythema or calor.  Neurological: Light touch sensation grossly intact bilateral feet.  Positive Tinel's sign with percussion of the proper digital nerve along the medial aspect of the right bunion  Musculoskeletal Exam:  There is a bony medial prominence on the dorsomedial aspect of the 1st metatarsal head of the right foot.  There is pain on palpation of the "bump" in this area.  Lateral deviation of the hallux at the MPJ level.  1st MPJ ROM is decreased.  No crepitus.  Hallux is tracking, not trackbound.    RADIOGRAPHIC EXAM (right foot, 3 weightbearing views, 08/06/2023):  Increased first intermetatarsal angle 8 degrees.  Increased hallux abductus angle.  Enlargement of bone at dorsomedial 1st metatarsal head.  Joint space is decreased at first MPJ.  Bipartite tibial sesamoid in position 4.  Mild increase in the metatarsus  adductus angle.  Long first metatarsal  ASSESSMENT/PLAN OF CARE: 1. Neuralgia   2. Bunion   3. Pain in joint of right foot     DG FOOT COMPLETE RIGHT  Discussed patient's condition, foot radiographs, and possible etiologies today.  Discussed conservative treatment options with patient today, including shoe modification / arch supports, off-loading, cortisone injectione, NSAID topical / oral therapy, and toe splints and  shields.  Briefly discussed surgical intervention if conservative options are not successful.  If she proceeded with bunion correction she would also need to have a procedure performed to shorten the first metatarsal while correcting the first intermetatarsal angle (i.e.: Bi-planar Eliberto Ivory)  The patient was fitted for a toe alignment splint that she will wear when sleeping and any times of prolonged nonweightbearing.  With the patient's consent a corticosteroid injection was administered to the right first MPJ, focusing on the medial aspect of the joint.  This consisted of a mixture of 1% lidocaine plain, 0.5% Marcaine plain, and Kenalog 10 for total of 1 cc administered.  She tolerated the injection well and a Band-Aid was applied.  She may massage over-the-counter Voltaren gel into the bunion area 2-3 times daily as needed for discomfort.  Discussed shoe gear with the patient today.  Return if symptoms worsen or fail to improve.  Clerance Lav, DPM, FACFAS [facility]     [address] [place], [place] [address]                Office [phone]  Fax [phone]

## 2023-08-07 ENCOUNTER — Other Ambulatory Visit: Payer: Self-pay | Admitting: Podiatry

## 2023-08-07 DIAGNOSIS — M21619 Bunion of unspecified foot: Secondary | ICD-10-CM

## 2023-10-08 LAB — HM MAMMOGRAPHY

## 2023-10-10 ENCOUNTER — Encounter: Payer: Self-pay | Admitting: Internal Medicine

## 2023-10-13 ENCOUNTER — Encounter: Payer: Self-pay | Admitting: Internal Medicine

## 2024-05-02 ENCOUNTER — Other Ambulatory Visit: Payer: Self-pay

## 2024-05-02 ENCOUNTER — Encounter (HOSPITAL_BASED_OUTPATIENT_CLINIC_OR_DEPARTMENT_OTHER): Payer: Self-pay | Admitting: Emergency Medicine

## 2024-05-02 ENCOUNTER — Emergency Department (HOSPITAL_BASED_OUTPATIENT_CLINIC_OR_DEPARTMENT_OTHER)

## 2024-05-02 ENCOUNTER — Emergency Department (HOSPITAL_BASED_OUTPATIENT_CLINIC_OR_DEPARTMENT_OTHER)
Admission: EM | Admit: 2024-05-02 | Discharge: 2024-05-02 | Disposition: A | Discharge status: Home / Self Care | Source: Home / Self Care | Attending: Emergency Medicine | Admitting: Emergency Medicine

## 2024-05-02 DIAGNOSIS — I10 Essential (primary) hypertension: Secondary | ICD-10-CM | POA: Diagnosis not present

## 2024-05-02 DIAGNOSIS — Z7982 Long term (current) use of aspirin: Secondary | ICD-10-CM | POA: Insufficient documentation

## 2024-05-02 DIAGNOSIS — R10A1 Flank pain, right side: Secondary | ICD-10-CM | POA: Diagnosis present

## 2024-05-02 DIAGNOSIS — E039 Hypothyroidism, unspecified: Secondary | ICD-10-CM | POA: Diagnosis not present

## 2024-05-02 DIAGNOSIS — N130 Hydronephrosis with ureteropelvic junction obstruction: Secondary | ICD-10-CM | POA: Insufficient documentation

## 2024-05-02 DIAGNOSIS — N200 Calculus of kidney: Secondary | ICD-10-CM

## 2024-05-02 LAB — CBC WITH DIFFERENTIAL/PLATELET
Abs Immature Granulocytes: 0.02 K/uL (ref 0.00–0.07)
Basophils Absolute: 0 K/uL (ref 0.0–0.1)
Basophils Relative: 1 %
Eosinophils Absolute: 0 K/uL (ref 0.0–0.5)
Eosinophils Relative: 1 %
HCT: 38.1 % (ref 36.0–46.0)
Hemoglobin: 13 g/dL (ref 12.0–15.0)
Immature Granulocytes: 0 %
Lymphocytes Relative: 17 %
Lymphs Abs: 1.5 K/uL (ref 0.7–4.0)
MCH: 31.9 pg (ref 26.0–34.0)
MCHC: 34.1 g/dL (ref 30.0–36.0)
MCV: 93.4 fL (ref 80.0–100.0)
Monocytes Absolute: 0.9 K/uL (ref 0.1–1.0)
Monocytes Relative: 11 %
Neutro Abs: 6.2 K/uL (ref 1.7–7.7)
Neutrophils Relative %: 70 %
Platelets: 211 K/uL (ref 150–400)
RBC: 4.08 MIL/uL (ref 3.87–5.11)
RDW: 12.2 % (ref 11.5–15.5)
WBC: 8.7 K/uL (ref 4.0–10.5)
nRBC: 0 % (ref 0.0–0.2)

## 2024-05-02 LAB — COMPREHENSIVE METABOLIC PANEL WITH GFR
ALT: 22 U/L (ref 0–44)
AST: 22 U/L (ref 15–41)
Albumin: 4.1 g/dL (ref 3.5–5.0)
Alkaline Phosphatase: 71 U/L (ref 38–126)
Anion gap: 11 (ref 5–15)
BUN: 19 mg/dL (ref 8–23)
CO2: 26 mmol/L (ref 22–32)
Calcium: 9.3 mg/dL (ref 8.9–10.3)
Chloride: 103 mmol/L (ref 98–111)
Creatinine, Ser: 0.72 mg/dL (ref 0.44–1.00)
GFR, Estimated: >60 mL/min
Glucose, Bld: 128 mg/dL — ABNORMAL HIGH (ref 70–99)
Potassium: 4.2 mmol/L (ref 3.5–5.1)
Sodium: 140 mmol/L (ref 135–145)
Total Bilirubin: 0.4 mg/dL (ref 0.0–1.2)
Total Protein: 6.8 g/dL (ref 6.5–8.1)

## 2024-05-02 MED ORDER — ONDANSETRON HCL 4 MG/2ML IJ SOLN
4.0000 mg | Freq: Once | INTRAMUSCULAR | Status: AC
  Administered 2024-05-02: 4 mg via INTRAVENOUS
  Filled 2024-05-02: qty 2

## 2024-05-02 MED ORDER — KETOROLAC TROMETHAMINE 30 MG/ML IJ SOLN
30.0000 mg | Freq: Once | INTRAMUSCULAR | Status: AC
  Administered 2024-05-02: 30 mg via INTRAVENOUS
  Filled 2024-05-02: qty 1

## 2024-05-02 MED ORDER — MORPHINE SULFATE (PF) 4 MG/ML IV SOLN
4.0000 mg | Freq: Once | INTRAVENOUS | Status: AC
  Administered 2024-05-02: 4 mg via INTRAVENOUS
  Filled 2024-05-02: qty 1

## 2024-05-02 MED ORDER — OXYCODONE-ACETAMINOPHEN 5-325 MG PO TABS: 1.0000 | ORAL_TABLET | Freq: Four times a day (QID) | ORAL | 0 refills | Status: AC | PRN

## 2024-05-02 NOTE — ED Triage Notes (Addendum)
 Pt reports right flank and right abd pain that began 1 week ago but worsened last night. Thought she had a UTI so she contacted PCP & got abx which she has been taking. Also c/o n/v that began last night.

## 2024-05-02 NOTE — Discharge Instructions (Addendum)
 Begin taking Percocet as prescribed as needed for pain.  Follow-up with urology if the stone has not passed in the next few days.  The contact information for [facility] has been provided in this discharge summary for you to call and make these arrangements.

## 2024-05-02 NOTE — ED Provider Notes (Signed)
 " [facility] Provider Note   CSN: [id number] Arrival date & time: 05/02/24  9472     Patient presents with: Abdominal Pain and Flank Pain   Emily Anderson is a 64 y.o. female.   Patient is a 64 year old female with history of prior hysterectomy and C-section x 2, hypertension, hyperlipidemia, hypothyroidism.  Patient presenting today with complaints of right flank and abdominal pain.  Symptoms began several days ago with urinary frequency.  Patient thought that she had a UTI and her primary doctor called her in an antibiotic, but pain worsened last night.  She describes severe pain to the right flank and abdomen that doubles her over in pain and caused her to vomit.  She denies any fevers or chills.  No bowel complaints.  She took ibuprofen  prior to coming here with some relief.       Prior to Admission medications  Medication Sig Start Date End Date Taking? Authorizing Provider  aspirin EC 81 MG tablet Take 81 mg by mouth daily.    [provider]  cetirizine (ZYRTEC) 10 MG tablet Take 10 mg by mouth daily.    [provider]  cholecalciferol (VITAMIN D) 25 MCG (1000 UNIT) tablet 1 tablet    [provider]  levothyroxine  (SYNTHROID ) 50 MCG tablet TAKE 2 TABLETS BY MOUTH EVERY SATURDAY AND SUNDAY AND 1 TABLET MONDAY THROUGH FRIDAY 08/06/23   Burns, Glade PARAS, MD  Multiple Vitamins-Minerals (CENTRUM SILVER PO) Take 1 tablet by mouth daily.    [provider]  Omega-3 Fatty Acids (FISH OIL PO) Take 1,200 mg by mouth daily.    [provider]  OVER THE COUNTER MEDICATION Tumeric Curcumin 500 mg. One capsule daily.    [provider]  polyethylene glycol powder (MIRALAX ) 17 GM/SCOOP powder Take 17 g by mouth daily. 08/19/19   Teressa Toribio SQUIBB, MD  valACYclovir (VALTREX) 1000 MG tablet Take 2,000 mg by mouth 2 (two) times daily as needed. 05/21/20   [provider]    Allergies:  Amoxicillin , Hctz [hydrochlorothiazide ], and Sulfamethoxazole-trimethoprim    Review of Systems  All other systems reviewed and are negative.   Updated Vital Signs BP 117/70   Pulse 87   Temp 98.2 F (36.8 C) (Oral)   Resp 19   LMP 07/19/2015   SpO2 97%   Physical Exam Vitals and nursing note reviewed.  Constitutional:      General: She is not in acute distress.    Appearance: She is well-developed. She is not diaphoretic.  HENT:     Head: Normocephalic and atraumatic.  Cardiovascular:     Rate and Rhythm: Normal rate and regular rhythm.     Heart sounds: No murmur heard.    No friction rub. No gallop.  Pulmonary:     Effort: Pulmonary effort is normal. No respiratory distress.     Breath sounds: Normal breath sounds. No wheezing.  Abdominal:     General: Bowel sounds are normal. There is no distension.     Palpations: Abdomen is soft.     Tenderness: There is abdominal tenderness in the right upper quadrant and right lower quadrant. There is right CVA tenderness. There is no left CVA tenderness, guarding or rebound.  Musculoskeletal:        General: Normal range of motion.     Cervical back: Normal range of motion and neck supple.  Skin:    General: Skin is warm and dry.  Neurological:  General: No focal deficit present.     Mental Status: She is alert and oriented to person, place, and time.     (all labs ordered are listed, but only abnormal results are displayed) Labs Reviewed - No data to display  EKG: None  Radiology: No results found.   Procedures   Medications Ordered in the ED  ondansetron  (ZOFRAN ) injection 4 mg (has no administration in time range)  ketorolac  (TORADOL ) 30 MG/ML injection 30 mg (has no administration in time range)  morphine (PF) 4 MG/ML injection 4 mg (has no administration in time range)                                    Medical Decision Making Amount and/or Complexity of Data Reviewed Labs: ordered. Radiology:  ordered.  Risk Prescription drug management.   Patient is a 64 year old female presenting with complaints of right flank and abdominal pain as described in the HPI.  She arrives here uncomfortable appearing, but with stable vital signs and is afebrile.  There is tenderness to the right flank and right abdomen.  Laboratory studies obtained including CBC and basic metabolic panel, both of which are unremarkable.  CT with renal protocol shows a 5 mm stone at the UVJ with hydronephrosis.  Patient has received Toradol  and morphine for pain along with Zofran  for nausea and symptoms have significantly improved.  I feel as though patient can safely be discharged with pain medication and follow-up with urology as needed.     Final diagnoses:  None    ED Discharge Orders     None          Geroldine Berg, MD 05/02/24 [phone]  "

## 2024-05-02 NOTE — ED Notes (Signed)
 Pt transported to CT ?

## 2024-08-04 ENCOUNTER — Encounter: Admitting: Internal Medicine

## 2024-08-05 ENCOUNTER — Encounter: Admitting: Internal Medicine
